# Patient Record
Sex: Female | Born: 1945 | Race: White | Hispanic: No | Smoking: Never smoker
Health system: Southern US, Community
[De-identification: ages and names within clinical notes are randomized; demographics above are authoritative.]

## PROBLEM LIST (undated history)

## (undated) DIAGNOSIS — E785 Hyperlipidemia, unspecified: Secondary | ICD-10-CM

## (undated) DIAGNOSIS — R9230 Dense breasts, unspecified: Secondary | ICD-10-CM

## (undated) DIAGNOSIS — M1990 Unspecified osteoarthritis, unspecified site: Secondary | ICD-10-CM

## (undated) DIAGNOSIS — M549 Dorsalgia, unspecified: Secondary | ICD-10-CM

## (undated) DIAGNOSIS — R32 Unspecified urinary incontinence: Secondary | ICD-10-CM

## (undated) DIAGNOSIS — T7840XA Allergy, unspecified, initial encounter: Secondary | ICD-10-CM

## (undated) DIAGNOSIS — M810 Age-related osteoporosis without current pathological fracture: Secondary | ICD-10-CM

## (undated) DIAGNOSIS — H9190 Unspecified hearing loss, unspecified ear: Secondary | ICD-10-CM

## (undated) DIAGNOSIS — R270 Ataxia, unspecified: Secondary | ICD-10-CM

## (undated) DIAGNOSIS — E079 Disorder of thyroid, unspecified: Secondary | ICD-10-CM

## (undated) DIAGNOSIS — F3289 Other specified depressive episodes: Secondary | ICD-10-CM

## (undated) DIAGNOSIS — F329 Major depressive disorder, single episode, unspecified: Secondary | ICD-10-CM

## (undated) DIAGNOSIS — R922 Inconclusive mammogram: Secondary | ICD-10-CM

## (undated) DIAGNOSIS — N950 Postmenopausal bleeding: Secondary | ICD-10-CM

## (undated) DIAGNOSIS — D219 Benign neoplasm of connective and other soft tissue, unspecified: Secondary | ICD-10-CM

## (undated) DIAGNOSIS — K219 Gastro-esophageal reflux disease without esophagitis: Secondary | ICD-10-CM

## (undated) DIAGNOSIS — H269 Unspecified cataract: Secondary | ICD-10-CM

## (undated) DIAGNOSIS — K08409 Partial loss of teeth, unspecified cause, unspecified class: Secondary | ICD-10-CM

## (undated) DIAGNOSIS — J309 Allergic rhinitis, unspecified: Secondary | ICD-10-CM

## (undated) HISTORY — DX: Inconclusive mammogram: R92.2

## (undated) HISTORY — DX: Allergy, unspecified, initial encounter: T78.40XA

## (undated) HISTORY — DX: Unspecified urinary incontinence: R32

## (undated) HISTORY — DX: Unspecified hearing loss, unspecified ear: H91.90

## (undated) HISTORY — PX: CATARACT EXTRACTION: SUR2

## (undated) HISTORY — DX: Partial loss of teeth, unspecified cause, unspecified class: K08.409

## (undated) HISTORY — DX: Unspecified cataract: H26.9

## (undated) HISTORY — PX: OTHER SURGICAL HISTORY: SHX169

## (undated) HISTORY — DX: Hyperlipidemia, unspecified: E78.5

## (undated) HISTORY — PX: MENISCUS REPAIR: SHX5179

## (undated) HISTORY — PX: WISDOM TOOTH EXTRACTION: SHX21

## (undated) HISTORY — DX: Ataxia, unspecified: R27.0

## (undated) HISTORY — DX: Postmenopausal bleeding: N95.0

## (undated) HISTORY — DX: Unspecified osteoarthritis, unspecified site: M19.90

## (undated) HISTORY — DX: Benign neoplasm of connective and other soft tissue, unspecified: D21.9

## (undated) HISTORY — DX: Disorder of thyroid, unspecified: E07.9

## (undated) HISTORY — PX: EYE SURGERY: SHX253

## (undated) HISTORY — DX: Major depressive disorder, single episode, unspecified: F32.9

## (undated) HISTORY — DX: Gastro-esophageal reflux disease without esophagitis: K21.9

## (undated) HISTORY — DX: Other specified depressive episodes: F32.89

## (undated) HISTORY — DX: Allergic rhinitis, unspecified: J30.9

## (undated) HISTORY — DX: Dorsalgia, unspecified: M54.9

## (undated) HISTORY — DX: Age-related osteoporosis without current pathological fracture: M81.0

## (undated) HISTORY — DX: Dense breasts, unspecified: R92.30

---

## 1999-12-25 ENCOUNTER — Other Ambulatory Visit: Admission: RE | Admit: 1999-12-25 | Discharge: 1999-12-25 | Payer: Self-pay | Admitting: Obstetrics and Gynecology

## 2001-01-02 ENCOUNTER — Other Ambulatory Visit: Admission: RE | Admit: 2001-01-02 | Discharge: 2001-01-02 | Payer: Self-pay | Admitting: Obstetrics and Gynecology

## 2001-07-14 ENCOUNTER — Other Ambulatory Visit: Admission: RE | Admit: 2001-07-14 | Discharge: 2001-07-14 | Payer: Self-pay | Admitting: Obstetrics and Gynecology

## 2001-09-03 HISTORY — PX: DILATION AND CURETTAGE OF UTERUS: SHX78

## 2001-11-20 ENCOUNTER — Other Ambulatory Visit: Admission: RE | Admit: 2001-11-20 | Discharge: 2001-11-20 | Payer: Self-pay | Admitting: Obstetrics and Gynecology

## 2001-12-02 DIAGNOSIS — N950 Postmenopausal bleeding: Secondary | ICD-10-CM

## 2001-12-02 HISTORY — DX: Postmenopausal bleeding: N95.0

## 2001-12-05 ENCOUNTER — Encounter: Payer: Self-pay | Admitting: Obstetrics and Gynecology

## 2001-12-05 ENCOUNTER — Encounter: Admission: RE | Admit: 2001-12-05 | Discharge: 2001-12-05 | Payer: Self-pay | Admitting: Obstetrics and Gynecology

## 2001-12-05 ENCOUNTER — Other Ambulatory Visit: Admission: RE | Admit: 2001-12-05 | Discharge: 2001-12-05 | Payer: Self-pay | Admitting: Radiology

## 2001-12-05 ENCOUNTER — Encounter (INDEPENDENT_AMBULATORY_CARE_PROVIDER_SITE_OTHER): Payer: Self-pay | Admitting: *Deleted

## 2001-12-05 HISTORY — PX: BREAST CYST ASPIRATION: SHX578

## 2001-12-08 ENCOUNTER — Other Ambulatory Visit: Admission: RE | Admit: 2001-12-08 | Discharge: 2001-12-08 | Payer: Self-pay | Admitting: Obstetrics and Gynecology

## 2002-01-01 HISTORY — PX: HYSTEROSCOPY: SHX211

## 2002-01-23 ENCOUNTER — Ambulatory Visit (HOSPITAL_COMMUNITY): Admission: RE | Admit: 2002-01-23 | Discharge: 2002-01-23 | Payer: Self-pay | Admitting: Obstetrics and Gynecology

## 2002-01-23 ENCOUNTER — Encounter (INDEPENDENT_AMBULATORY_CARE_PROVIDER_SITE_OTHER): Payer: Self-pay | Admitting: *Deleted

## 2002-06-05 ENCOUNTER — Other Ambulatory Visit: Admission: RE | Admit: 2002-06-05 | Discharge: 2002-06-05 | Payer: Self-pay | Admitting: Obstetrics and Gynecology

## 2002-12-14 ENCOUNTER — Encounter: Payer: Self-pay | Admitting: Obstetrics and Gynecology

## 2002-12-14 ENCOUNTER — Encounter: Admission: RE | Admit: 2002-12-14 | Discharge: 2002-12-14 | Payer: Self-pay | Admitting: Obstetrics and Gynecology

## 2002-12-24 ENCOUNTER — Other Ambulatory Visit: Admission: RE | Admit: 2002-12-24 | Discharge: 2002-12-24 | Payer: Self-pay | Admitting: Obstetrics and Gynecology

## 2003-06-28 ENCOUNTER — Other Ambulatory Visit: Admission: RE | Admit: 2003-06-28 | Discharge: 2003-06-28 | Payer: Self-pay | Admitting: Obstetrics and Gynecology

## 2003-12-13 ENCOUNTER — Other Ambulatory Visit: Admission: RE | Admit: 2003-12-13 | Discharge: 2003-12-13 | Payer: Self-pay | Admitting: Obstetrics and Gynecology

## 2003-12-15 ENCOUNTER — Encounter: Admission: RE | Admit: 2003-12-15 | Discharge: 2003-12-15 | Payer: Self-pay | Admitting: Obstetrics and Gynecology

## 2004-03-31 ENCOUNTER — Ambulatory Visit (HOSPITAL_COMMUNITY): Admission: RE | Admit: 2004-03-31 | Discharge: 2004-03-31 | Payer: Self-pay | Admitting: Gastroenterology

## 2004-08-18 ENCOUNTER — Other Ambulatory Visit: Admission: RE | Admit: 2004-08-18 | Discharge: 2004-08-18 | Payer: Self-pay | Admitting: Obstetrics and Gynecology

## 2004-12-15 ENCOUNTER — Encounter: Admission: RE | Admit: 2004-12-15 | Discharge: 2004-12-15 | Payer: Self-pay | Admitting: Obstetrics and Gynecology

## 2004-12-26 ENCOUNTER — Other Ambulatory Visit: Admission: RE | Admit: 2004-12-26 | Discharge: 2004-12-26 | Payer: Self-pay | Admitting: Obstetrics and Gynecology

## 2005-05-08 ENCOUNTER — Ambulatory Visit: Payer: Self-pay | Admitting: Internal Medicine

## 2005-12-14 ENCOUNTER — Emergency Department (HOSPITAL_COMMUNITY): Admission: EM | Admit: 2005-12-14 | Discharge: 2005-12-14 | Payer: Self-pay | Admitting: Family Medicine

## 2005-12-18 ENCOUNTER — Encounter: Admission: RE | Admit: 2005-12-18 | Discharge: 2005-12-18 | Payer: Self-pay | Admitting: Obstetrics and Gynecology

## 2006-01-18 ENCOUNTER — Other Ambulatory Visit: Admission: RE | Admit: 2006-01-18 | Discharge: 2006-01-18 | Payer: Self-pay | Admitting: Obstetrics and Gynecology

## 2006-02-14 ENCOUNTER — Encounter: Admission: RE | Admit: 2006-02-14 | Discharge: 2006-02-14 | Payer: Self-pay | Admitting: Obstetrics and Gynecology

## 2006-09-04 ENCOUNTER — Emergency Department (HOSPITAL_COMMUNITY): Admission: EM | Admit: 2006-09-04 | Discharge: 2006-09-04 | Payer: Self-pay | Admitting: Family Medicine

## 2006-09-27 ENCOUNTER — Ambulatory Visit: Payer: Self-pay | Admitting: Internal Medicine

## 2007-01-03 ENCOUNTER — Other Ambulatory Visit: Admission: RE | Admit: 2007-01-03 | Discharge: 2007-01-03 | Payer: Self-pay | Admitting: Obstetrics and Gynecology

## 2007-01-07 ENCOUNTER — Encounter: Admission: RE | Admit: 2007-01-07 | Discharge: 2007-01-07 | Payer: Self-pay | Admitting: Obstetrics and Gynecology

## 2007-01-09 ENCOUNTER — Encounter: Admission: RE | Admit: 2007-01-09 | Discharge: 2007-01-09 | Payer: Self-pay | Admitting: Obstetrics and Gynecology

## 2007-09-22 DIAGNOSIS — J3089 Other allergic rhinitis: Secondary | ICD-10-CM | POA: Insufficient documentation

## 2007-09-22 DIAGNOSIS — J302 Other seasonal allergic rhinitis: Secondary | ICD-10-CM | POA: Insufficient documentation

## 2007-09-23 ENCOUNTER — Ambulatory Visit: Payer: Self-pay | Admitting: Internal Medicine

## 2007-09-23 DIAGNOSIS — K219 Gastro-esophageal reflux disease without esophagitis: Secondary | ICD-10-CM | POA: Insufficient documentation

## 2008-01-08 ENCOUNTER — Encounter: Admission: RE | Admit: 2008-01-08 | Discharge: 2008-01-08 | Payer: Self-pay | Admitting: Obstetrics and Gynecology

## 2008-01-09 ENCOUNTER — Other Ambulatory Visit: Admission: RE | Admit: 2008-01-09 | Discharge: 2008-01-09 | Payer: Self-pay | Admitting: Obstetrics and Gynecology

## 2008-09-22 ENCOUNTER — Ambulatory Visit: Payer: Self-pay | Admitting: Internal Medicine

## 2009-01-10 ENCOUNTER — Encounter: Admission: RE | Admit: 2009-01-10 | Discharge: 2009-01-10 | Payer: Self-pay | Admitting: Obstetrics and Gynecology

## 2009-02-01 ENCOUNTER — Encounter: Payer: Self-pay | Admitting: Internal Medicine

## 2009-02-01 LAB — CONVERTED CEMR LAB

## 2009-09-21 ENCOUNTER — Ambulatory Visit: Payer: Self-pay | Admitting: Internal Medicine

## 2010-01-11 ENCOUNTER — Encounter: Admission: RE | Admit: 2010-01-11 | Discharge: 2010-01-11 | Payer: Self-pay | Admitting: Obstetrics and Gynecology

## 2010-01-16 ENCOUNTER — Encounter: Admission: RE | Admit: 2010-01-16 | Discharge: 2010-01-16 | Payer: Self-pay | Admitting: Obstetrics and Gynecology

## 2010-02-01 LAB — HM MAMMOGRAPHY

## 2010-02-13 ENCOUNTER — Encounter: Payer: Self-pay | Admitting: Internal Medicine

## 2010-02-14 ENCOUNTER — Encounter: Payer: Self-pay | Admitting: Internal Medicine

## 2010-02-14 LAB — CONVERTED CEMR LAB

## 2010-02-15 ENCOUNTER — Encounter: Payer: Self-pay | Admitting: Internal Medicine

## 2010-02-15 LAB — CONVERTED CEMR LAB
Cholesterol: 265 mg/dL
HDL: 68 mg/dL
LDL Cholesterol: 170 mg/dL
Triglyceride fasting, serum: 137 mg/dL

## 2010-09-21 ENCOUNTER — Ambulatory Visit
Admission: RE | Admit: 2010-09-21 | Discharge: 2010-09-21 | Payer: Self-pay | Source: Home / Self Care | Attending: Internal Medicine | Admitting: Internal Medicine

## 2010-09-24 ENCOUNTER — Encounter: Payer: Self-pay | Admitting: Obstetrics and Gynecology

## 2010-10-05 NOTE — Assessment & Plan Note (Signed)
Summary: 12 MTH FU-STC   Visit Type:  Follow-up PCP:  none  Chief Complaint:  Yearly follow up.  History of Present Illness: allergic rhintis  F/U. Stopped Nasonex - was causing epistaxis and burned throat to cause cough. Still dry pnd type cough and sniff. Denies sinus pressur/pain or purulence. Rarely a little chest  tight without cough now  or wheeze .Much heart burn. Takes daily otc prilosec.    Current Allergies: No known allergies   Past Surgical History:    Reviewed history and no changes required:       none   Family History:    Reviewed history and no changes required:       Mother died Alzheimers       Father died lung cancer       Allergic rhinitis- mother and brothers  Social History:    Reviewed history and no changes required:       Patient never smoked.        Manager of computer programming   Risk Factors:  Tobacco use:  never   Review of Systems      See HPI   Vital Signs:  Patient Profile:   65 Years Old Female Weight:      180.38 pounds O2 Sat:      97 % O2 treatment:    Room Air Pulse rate:   90 / minute BP sitting:   142 / 82  (left arm) Cuff size:   regular  Vitals Entered By: Reynaldo Minium CMA (September 23, 2007 9:54 AM)             Comments Medications reviewed with patient  ..................................................................Marland KitchenReynaldo Minium CMA  September 23, 2007 9:54 AM     Physical Exam  General:     normal appearance.   Head:     normocephalic and atraumatic Eyes:     PERRLA/EOM intact; conjunctiva and sclera clear Nose:     A little mucus bridging without erythema Mouth:     mild tonsil erythema Neck:     no masses, thyromegaly, or abnormal cervical nodes Chest Wall:     no deformities noted Lungs:     clear bilaterally to auscultation and percussion Heart:     regular rate and rhythm, S1, S2 without murmurs, rubs, gallops, or clicks Extremities:     no clubbing, cyanosis, edema, or  deformity noted Cervical Nodes:     no significant adenopathy     Impression & Recommendations:  Problem # 1:  ALLERGIC RHINITIS (ICD-477.9) Assessment: Unchanged D/c nasonex which is associated with epistaxis. Try sample Patanase Try Claritin or Zyrtec otc prn  The following medications were removed from the medication list:    Nasonex 50 Mcg/act Susp (Mometasone furoate) ..... Use 2 sprays daily  Her updated medication list for this problem includes:    Patanase 0.6 % Soln (Olopatadine hcl) .Marland Kitchen... 1-2 sprays each nostril up to two times a day as needed  Orders: Est. Patient Level III (16109)   Problem # 2:  GERD (ICD-530.81) discussed reflux precautions and options Her updated medication list for this problem includes:    Prilosec Otc 20 Mg Tbec (Omeprazole magnesium) .Marland Kitchen... 1 daily  Orders: Est. Patient Level III (60454)   Medications Added to Medication List This Visit: 1)  Prilosec Otc 20 Mg Tbec (Omeprazole magnesium) .Marland Kitchen.. 1 daily 2)  Patanase 0.6 % Soln (Olopatadine hcl) .Marland Kitchen.. 1-2 sprays each nostril up to two times a day as needed  Patient Instructions: 1)  Please schedule a follow-up appointment in 1 year. 2)  Try Prilosec twice daily before meals and carry Tums. 3)  Try sample Patanase nasal spray, 1-2 puffs each nostril up to twice daily if needed.- 4)  Try over the counter antihistamine like Claritin or Zyrtec, with or wihout a decongestant "-D")    Prescriptions: PATANASE 0.6 %  SOLN (OLOPATADINE HCL) 1-2 sprays each nostril up to two times a day as needed  #1 x prn   Entered and Authorized by:   Waymon Budge MD   Signed by:   Waymon Budge MD on 09/28/2007   Method used:   Historical   RxID:   [id number]  ]

## 2010-10-05 NOTE — Assessment & Plan Note (Signed)
Summary: rov 1 yr ///kp   Primary Provider/Referring Provider:  none  CC:  Follow up visit-allergies; occasional sneezing.Marland Kitchen  History of Present Illness: Oct 14, 2008- Allergic rhinitis, hx GERD Good year and winter so far with no major events. Had seasonal flu vax. Hx skin test positive allergic rhinits 1990's rx'd with vaccine for aboutr 6 months then. Does c/o sniffing, cough, nasal drainage. Claritin -D is big help but then she can't sleep.Discussed antihistamine, decongestant and steroid nasal comparison.   September 21, 2009- Allergic rhinitis, hx GERD Much of last year has had nasal congestion and postnasal drip. Astepro overdries/ nosebleeds. Nasal stuffiness keeps her awake. Ears and chest are ok. Wakes with morning frontal headache. Nasal discharge is yellow, scant.  September 21, 2010-  Allergic rhinitis, hx GERD Nurse-CC: Follow up visit-allergies; occasional sneezing. Discussed a nasal blockage sensation when her dentist applied a nasal cone for anesthetic -sounds as if it was a mechanical issue, not induced nasal congestion. Persistent perennial sense of stuffiness . No beneft from Astepro and over the past year doesn't renmember needing antihistamines; hasn't even had a cold.      Preventive Screening-Counseling & Management  Alcohol-Tobacco     Smoking Status: never     Passive Smoke Exposure: yes  Comments: First husband was smoker- died of lung Ca.   Current Medications (verified): 1)  Prempro 0.3-1.5 Mg Tabs (Conj Estrog-Medroxyprogest Ace) .... Take 1 By Mouth Once Daily 2)  Prilosec Otc 20 Mg  Tbec (Omeprazole Magnesium) .Marland Kitchen.. 1 Daily 3)  Vitamin D (Ergocalciferol) 50000 Unit Caps (Ergocalciferol) .... Take 1 By Mouth Weekly 4)  Vitamin E 1000 Unit Caps (Vitamin E) .... Take  1 By Mouth Once Daily 5)  Calcium-Vitamin D 500-200 Mg-Unit Tabs (Calcium Carbonate-Vitamin D) .... Take 1 By Mouth Once Daily Slow Release Tablets 6)  Fish Oil 1200 Mg Caps (Omega-3 Fatty  Acids) .... Take 1 By Mouth Once Daily 7)  Centrum Silver  Tabs (Multiple Vitamins-Minerals) .... Take 1 By Mouth Once Daily  Allergies (verified): No Known Drug Allergies  Past History:  Past Medical History: Last updated: 10/14/2008  GERD (ICD-530.81) ALLERGIC RHINITIS (ICD-477.9)  Past Surgical History: Last updated: 09/21/2009 arthroscopy left knee  Family History: Last updated: 10/14/2008 Mother died-age 74;Alzheimers,breast cancer,allergies Father died-age 37;lung cancer Allergic rhinitis- mother and brothers   Sibling 1-deceased age 47; alzheimers Sibling 2- living age 53; COPD  Social History: Last updated: 10-14-08 Patient never smoked.  Manager of computer programming Positive history of passive tobacco smoke exposure.  Caffeine-2 cups daily ETOH-1 drink every 2 months Married with no children.  Risk Factors: Smoking Status: never (09/21/2010) Passive Smoke Exposure: yes (09/21/2010)  Review of Systems      See HPI  The patient denies anorexia, fever, weight loss, weight gain, vision loss, decreased hearing, hoarseness, chest pain, syncope, dyspnea on exertion, peripheral edema, prolonged cough, headaches, hemoptysis, abdominal pain, melena, hematochezia, severe indigestion/heartburn, suspicious skin lesions, unusual weight change, abnormal bleeding, and enlarged lymph nodes.    Vital Signs:  Patient profile:   65 year old female Height:      66 inches Weight:      164.38 pounds BMI:     26.63 O2 Sat:      95 % on Room air Pulse rate:   98 / minute BP sitting:   140 / 90  (left arm) Cuff size:   regular  Vitals Entered By: Reynaldo Minium CMA (September 21, 2010 1:40 PM)  O2 Flow:  Room air CC:  Follow up visit-allergies; occasional sneezing.   Physical Exam  Additional Exam:  General: A/Ox3; pleasant and cooperative, NAD, well appearing SKIN: no rash, lesions NODES: no lymphadenopathy HEENT: [place]/AT, EOM- WNL, Conjuctivae- clear, PERRLA,  TM-WNL, Nose- clear, no drainage or polyps seen, Throat- clear and wnl, Mallampati  NECK: Supple w/ fair ROM, JVD- none, normal carotid impulses w/o bruits Thyroid- normal to palpation CHEST: Clear to P&A HEART: RRR, no m/g/r heard ABDOMEN: Soft and nl;  XBJ:YNWG, nl pulses, no edema  NEURO: Grossly intact to observation      Impression & Recommendations:  Problem # 1:  ALLERGIC RHINITIS (ICD-477.9)  Mild perennial sense of stuffiness without objective findings and not bothering her enough that she sees benefit to treating it . I will see her as needed and recommended she get a primary doctor.  The following medications were removed from the medication list:    Astepro 0.15 % Soln (Azelastine hcl) .Marland Kitchen... 1-2 puffs each nostril up to twice daily as needed    Nasonex 50 Mcg/act Susp (Mometasone furoate) .Marland Kitchen... 1-2 sprays each nostril once daily  Medications Added to Medication List This Visit: 1)  Vitamin E 1000 Unit Caps (Vitamin e) .... Take  1 by mouth once daily 2)  Calcium-vitamin D 500-200 Mg-unit Tabs (Calcium carbonate-vitamin d) .... Take 1 by mouth once daily slow release tablets 3)  Fish Oil 1200 Mg Caps (Omega-3 fatty acids) .... Take 1 by mouth once daily 4)  Centrum Silver Tabs (Multiple vitamins-minerals) .... Take 1 by mouth once daily  Other Orders: Est. Patient Level III (95621) Primary Care Referral (Primary)  Patient Instructions: 1)  Please schedule a follow-up appointment as needed. I'll be happy to see you as needed. 2)  Possible names for primary physicians 3)  See our [facility]

## 2010-10-05 NOTE — Assessment & Plan Note (Signed)
Summary: 1 year/apc   PCP:  none  Chief Complaint:  yearly follow up visit .  History of Present Illness: Current Problems:  GERD (ICD-530.81) ALLERGIC RHINITIS (ICD-477.9)  09/23/07- History of Present Illness: allergic rhintis F/U. Stopped Nasonex - was causing epistaxis and burned throat to cause cough. Still dry pnd type cough and sniff. Denies sinus pressur/pain or purulence. Rarely a little chest  tight without cough now  or wheeze .Much heart burn. Takes daily otc prilosec.   09/22/08- Allergic rhinitis, hx GERD Good year and winter so far with no major events. Had seasonal flu vax. Hx skin test positive allergic rhinits 1990's rx'd with vaccine for aboutr 6 months then. Does c/o sniffing, cough, nasal drainage. Claritin -D is big help but then she can't sleep.Discussed antihistamine, decongestant and steroid nasal comparison.           Prior Medications Reviewed Using: Patient Recall  Updated Prior Medication List: PROZAC 40 MG  CAPS (FLUOXETINE HCL) take one capsule by mouth once daily WELLBUTRIN XL 300 MG  TB24 (BUPROPION HCL) Take 1 tablet by mouth once a day PREMPRO 0.45-1.5 MG  TABS (CONJ ESTROG-MEDROXYPROGEST ACE) Take 1 tablet by mouth once a day PRILOSEC OTC 20 MG  TBEC (OMEPRAZOLE MAGNESIUM) 1 daily  Current Allergies (reviewed today): No known allergies   Past Medical History:    Reviewed history and no changes required:              GERD (ICD-530.81)       ALLERGIC RHINITIS (ICD-477.9)         Past Surgical History:    Reviewed history from 09/23/2007 and no changes required:       none   Family History:    Reviewed history from 09/23/2007 and no changes required:       Mother died-age 59;Alzheimers,breast cancer,allergies       Father died-age 27;lung cancer       Allergic rhinitis- mother and brothers                     Sibling 1-deceased age 63; alzheimers       Sibling 2- living age 23; COPD  Social History:    Reviewed history  from 09/23/2007 and no changes required:       Patient never smoked.        Manager of computer programming       Positive history of passive tobacco smoke exposure.        Caffeine-2 cups daily       ETOH-1 drink every 2 months       Married with no children.   Risk Factors:  Passive smoke exposure:  yes   Review of Systems      See HPI  The patient denies anorexia, fever, weight loss, weight gain, vision loss, decreased hearing, hoarseness, chest pain, syncope, dyspnea on exertion, peripheral edema, prolonged cough, headaches, hemoptysis, abdominal pain, melena, hematochezia, severe indigestion/heartburn, hematuria, incontinence, muscle weakness, suspicious skin lesions, transient blindness, difficulty walking, depression, unusual weight change, abnormal bleeding, enlarged lymph nodes, and angioedema.     Vital Signs:  Patient Profile:   65 Years Old Female Weight:      177.25 pounds O2 Sat:      96 % O2 treatment:    Room Air Pulse rate:   85 / minute BP sitting:   148 / 80  (right arm) Cuff size:   regular  Vitals Entered By: Reynaldo Minium CMA (September 22, 2008 9:18 AM)             Comments Medications reviewed with patient Reynaldo Minium CMA  September 22, 2008 9:18 AM      Physical Exam  General: A/Ox3; pleasant and cooperative, NAD, SKIN: no rash, lesions NODES: no lymphadenopathy HEENT: [place]/AT, EOM- WNL, Conjuctivae- clear, PERRLA, TM-WNL, Nose- clear, Throat- clear and wnl, Melampatti II NECK: Supple w/ fair ROM, JVD- none, normal carotid impulses w/o bruits Thyroid- normal to palpation CHEST: Clear to P&A HEART: RRR, no m/g/r heard ABDOMEN: Soft and nl; nml bowel sounds; no organomegaly or masses noted EAV:WUJW, nl pulses, no edema  NEURO: Grossly intact to observation         Impression & Recommendations:  Problem # 1:  ALLERGIC RHINITIS (ICD-477.9) Needs a little more intervention. discussed use of Sudafed-PE otc, with plain Clairitin The  following medications were removed from the medication list:    Patanase 0.6 % Soln (Olopatadine hcl) .Marland Kitchen... 1-2 sprays each nostril up to two times a day as needed  Her updated medication list for this problem includes:    Astepro 0.15 % Soln (Azelastine hcl) .Marland Kitchen... 1-2 puffs each nostril up to twice daily as needed   Medications Added to Medication List This Visit: 1)  Astepro 0.15 % Soln (Azelastine hcl) .Marland Kitchen.. 1-2 puffs each nostril up to twice daily as needed  Other Orders: Pneumococcal Vaccine (11914) Admin 1st Vaccine (78295)   Patient Instructions: 1)  Please schedule a follow-up appointment in 1 year. 2)  Try sample/ script Astepro- antihistamine nasal spray 1-2 puffs each nostril up to twice daily as needed 3)  Consider otc Sudafed-PE as a decongestant for use by itself or with plain claritin as needed. Try to stay away from "24 hour" products until you are sure they won't keep you awake. 4)  Pnuemonia vax # 1   Prescriptions: ASTEPRO 0.15 % SOLN (AZELASTINE HCL) 1-2 puffs each nostril up to twice daily as needed  #1 x prn   Entered and Authorized by:   Waymon Budge MD   Signed by:   Waymon Budge MD on 09/22/2008   Method used:   Print then Give to Patient   RxID:   [id number]    Pneumovax Vaccine    Vaccine Type: Pneumovax    Site: left deltoid    Mfr: Merck    Dose: 0.5 ml    Route: IM    Given by: Boone Master CNA    Exp. Date: 08/05/2009    Lot #: [id number]    VIS given: 03/31/96 version given September 22, 2008.

## 2010-10-05 NOTE — Assessment & Plan Note (Signed)
Summary: f/u 1 yr///kp   Primary Provider/Referring Provider:  none  CC:  Yearly follow up visit-Increased Allergy trouble-trouble sleeping due to congestion (nasal)..  History of Present Illness:  2007-10-11- History of Present Illness: allergic rhintis F/U. Stopped Nasonex - was causing epistaxis and burned throat to cause cough. Still dry pnd type cough and sniff. Denies sinus pressur/pain or purulence. Rarely a little chest  tight without cough now  or wheeze .Much heart burn. Takes daily otc prilosec.   2008/10/10- Allergic rhinitis, hx GERD Good year and winter so far with no major events. Had seasonal flu vax. Hx skin test positive allergic rhinits 1990's rx'd with vaccine for aboutr 6 months then. Does c/o sniffing, cough, nasal drainage. Claritin -D is big help but then she can't sleep.Discussed antihistamine, decongestant and steroid nasal comparison.   September 21, 2009- Allergic rhinitis, hx GERD Much of last year has had nasal congestion and postnasal drip. Astepro overdries/ nosebleeds. Nasal stuffiness keeps her awake. Ears and chest are ok. Wakes with morning frontal headache. Nasal discharge is yellow, scant.      Current Medications (verified): 1)  Prozac 40 Mg  Caps (Fluoxetine Hcl) .... Take One Capsule By Mouth Once Daily 2)  Wellbutrin Xl 300 Mg  Tb24 (Bupropion Hcl) .... Take 1 Tablet By Mouth Once A Day 3)  Prempro 0.3-1.5 Mg Tabs (Conj Estrog-Medroxyprogest Ace) .... Take 1 By Mouth Once Daily 4)  Prilosec Otc 20 Mg  Tbec (Omeprazole Magnesium) .Marland Kitchen.. 1 Daily 5)  Astepro 0.15 % Soln (Azelastine Hcl) .Marland Kitchen.. 1-2 Puffs Each Nostril Up To Twice Daily As Needed 6)  Vitamin D (Ergocalciferol) 50000 Unit Caps (Ergocalciferol) .... Take 1 By Mouth Weekly  Allergies (verified): No Known Drug Allergies  Past History:  Past Medical History: Last updated: Oct 10, 2008  GERD (ICD-530.81) ALLERGIC RHINITIS (ICD-477.9)  Family History: Last updated: 2008-10-10 Mother  died-age 61;Alzheimers,breast cancer,allergies Father died-age 16;lung cancer Allergic rhinitis- mother and brothers   Sibling 1-deceased age 75; alzheimers Sibling 2- living age 52; COPD  Social History: Last updated: 10-10-08 Patient never smoked.  Manager of computer programming Positive history of passive tobacco smoke exposure.  Caffeine-2 cups daily ETOH-1 drink every 2 months Married with no children.  Risk Factors: Smoking Status: never (Oct 11, 2007) Passive Smoke Exposure: yes (October 10, 2008)  Past Surgical History: arthroscopy left knee  Review of Systems      See HPI  The patient denies anorexia, fever, weight loss, weight gain, vision loss, decreased hearing, hoarseness, chest pain, syncope, dyspnea on exertion, peripheral edema, prolonged cough, hemoptysis, and severe indigestion/heartburn.    Vital Signs:  Patient profile:   65 year old female Height:      66 inches Weight:      174.25 pounds BMI:     28.23 O2 Sat:      96 % on Room air Pulse rate:   100 / minute BP sitting:   162 / 80  (right arm) Cuff size:   regular  Vitals Entered By: Reynaldo Minium CMA (September 21, 2009 9:06 AM)  O2 Flow:  Room air  Physical Exam  Additional Exam:  General: A/Ox3; pleasant and cooperative, NAD, SKIN: no rash, lesions NODES: no lymphadenopathy HEENT: [place]/AT, EOM- WNL, Conjuctivae- clear, PERRLA, TM-WNL, Nose- clear, no drainage or polyps seen, Throat- clear and wnl, Melampatti II NECK: Supple w/ fair ROM, JVD- none, normal carotid impulses w/o bruits Thyroid- normal to palpation CHEST: Clear to P&A HEART: RRR, no m/g/r heard ABDOMEN: Soft and nl;  EPP:IRJJ, nl  pulses, no edema  NEURO: Grossly intact to observation      Impression & Recommendations:  Problem # 1:  ALLERGIC RHINITIS (ICD-477.9)  Chronic rhinitis. Astepro overdried. I will try a combination of nasal steroid spray and Neti lavage. If no better, then either antibiotics or a sinus CT. Her  updated medication list for this problem includes:    Astepro 0.15 % Soln (Azelastine hcl) .Marland Kitchen... 1-2 puffs each nostril up to twice daily as needed    Nasonex 50 Mcg/act Susp (Mometasone furoate) .Marland Kitchen... 1-2 sprays each nostril once daily  Medications Added to Medication List This Visit: 1)  Prempro 0.3-1.5 Mg Tabs (Conj estrog-medroxyprogest ace) .... Take 1 by mouth once daily 2)  Vitamin D (ergocalciferol) 50000 Unit Caps (Ergocalciferol) .... Take 1 by mouth weekly 3)  Nasonex 50 Mcg/act Susp (Mometasone furoate) .Marland Kitchen.. 1-2 sprays each nostril once daily  Other Orders: Est. Patient Level III (81191)  Patient Instructions: 1)  Schedule return in one year, earlier if needed 2)  If you don't get disitinctly better, then call so we can try some thing else.  3)  Try sample/ script Nasonex nasal steroid spray: 2 puffs each nostril once daily 4)  Try rinsing nasal passages with saline- Neti pot or squeeze bottle, every day or so. Prescriptions: NASONEX 50 MCG/ACT SUSP (MOMETASONE FUROATE) 1-2 sprays each nostril once daily  #1 x prn   Entered and Authorized by:   Waymon Budge MD   Signed by:   Waymon Budge MD on 09/21/2009   Method used:   Print then Give to Patient   RxID:   [id number]

## 2010-11-01 ENCOUNTER — Encounter: Payer: Self-pay | Admitting: Internal Medicine

## 2010-11-01 ENCOUNTER — Ambulatory Visit (INDEPENDENT_AMBULATORY_CARE_PROVIDER_SITE_OTHER): Payer: 59 | Admitting: Internal Medicine

## 2010-11-01 DIAGNOSIS — K219 Gastro-esophageal reflux disease without esophagitis: Secondary | ICD-10-CM

## 2010-11-01 DIAGNOSIS — Z87448 Personal history of other diseases of urinary system: Secondary | ICD-10-CM | POA: Insufficient documentation

## 2010-11-01 DIAGNOSIS — J309 Allergic rhinitis, unspecified: Secondary | ICD-10-CM

## 2010-11-01 DIAGNOSIS — F329 Major depressive disorder, single episode, unspecified: Secondary | ICD-10-CM | POA: Insufficient documentation

## 2010-11-01 DIAGNOSIS — R32 Unspecified urinary incontinence: Secondary | ICD-10-CM

## 2010-11-01 DIAGNOSIS — F419 Anxiety disorder, unspecified: Secondary | ICD-10-CM | POA: Insufficient documentation

## 2010-11-01 DIAGNOSIS — N3281 Overactive bladder: Secondary | ICD-10-CM | POA: Insufficient documentation

## 2010-11-01 DIAGNOSIS — E785 Hyperlipidemia, unspecified: Secondary | ICD-10-CM | POA: Insufficient documentation

## 2010-11-01 DIAGNOSIS — G43909 Migraine, unspecified, not intractable, without status migrainosus: Secondary | ICD-10-CM | POA: Insufficient documentation

## 2010-11-09 ENCOUNTER — Encounter: Payer: Self-pay | Admitting: Internal Medicine

## 2010-11-09 NOTE — Assessment & Plan Note (Signed)
Summary: NEW/UHC/NWS   Vital Signs:  Patient profile:   65 year old female Height:      66 inches (167.64 cm) Weight:      163.6 pounds (74.36 kg) O2 Sat:      97 % on Room air Temp:     97.9 degrees F (36.61 degrees C) oral Pulse rate:   93 / minute BP sitting:   132 / 72  (left arm) Cuff size:   regular  Vitals Entered By: Orlan Leavens RMA (November 01, 2010 3:02 PM)  O2 Flow:  Room air CC: New patient Is Patient Diabetic? No Pain Assessment Patient in pain? no        Primary Care Provider:  Newt Lukes MD  CC:  New patient.  History of Present Illness: new to me and our division, here to est care  c/o urinary incontinence describes progressive urge symptoms>stress related incontinence  onset >5yr ago with stress symptoms, but urge worse in past 12 mo no dysuria, no hematuria not improved with behav changes has not tried meds for same but would like to do so now  also reviewed chronci med issues: allg rhinitis- freq pulm and sinus infx related to flares - sees pulm for same - uses otc meds to tx same as needed  vit d defic - follows with gyn for same - reports compliance with ongoing medical treatment and no changes in medication dose or frequency. denies adverse side effects related to current therapy.   dyslipidemia - has declined med tx suggested by gyn - understands need for diet and exercise to control same but not compliant with recs for same - no ASD complications in FH  Preventive Screening-Counseling & Management  Alcohol-Tobacco     Alcohol drinks/day: <1     Alcohol Counseling: not indicated; use of alcohol is not excessive or problematic     Smoking Status: never     Tobacco Counseling: not indicated; no tobacco use  Caffeine-Diet-Exercise     Does Patient Exercise: no     Exercise Counseling: to improve exercise regimen     Depression Counseling: not indicated; screening negative for depression  Safety-Violence-Falls     Seat Belt  Counseling: not indicated; patient wears seat belts     Firearm Counseling: not applicable     Violence Counseling: not indicated; no violence risk noted  Clinical Review Panels:  Prevention   Last Mammogram:  No specific mammographic evidence of malignancy.   (02/01/2010)   Last Pap Smear:  Interpretation Result:Negative for intraepithelial Lesion or Malignancy.    (02/01/2009)  Immunizations   Last Pneumovax:  Pneumovax (09/22/2008)   Current Medications (verified): 1)  Prempro 0.3-1.5 Mg Tabs (Conj Estrog-Medroxyprogest Ace) .... Take 1 By Mouth Once Daily 2)  Prilosec Otc 20 Mg  Tbec (Omeprazole Magnesium) .... Take As Needed 3)  Vitamin D (Ergocalciferol) 50000 Unit Caps (Ergocalciferol) .... Take 1 By Mouth Weekly 4)  Vitamin E 1000 Unit Caps (Vitamin E) .... Take  1 By Mouth Once Daily 5)  Calcium-Vitamin D 500-200 Mg-Unit Tabs (Calcium Carbonate-Vitamin D) .... Take 1 By Mouth Once Daily Slow Release Tablets 6)  Fish Oil 1200 Mg Caps (Omega-3 Fatty Acids) .... Take 1 By Mouth Once Daily 7)  Centrum Silver  Tabs (Multiple Vitamins-Minerals) .... Take 1 By Mouth Once Daily 8)  Metamucil 0.52 Gm Caps (Psyllium) .... Take 5 Capsule Daily  Allergies (verified): No Known Drug Allergies  Past History:  Past Medical History: GERD ALLERGIC RHINITIS  Depression hx Hyperlipidemia vit d defic Urinary incontinence, mixed  Md roster: gyn - [facility] - dean pulm - [facility] - mann  Past Surgical History: arthroscopy left knee D & C (2003)  Family History: Mother died-age 24;Alzheimers,breast cancer age 87,allergies Father died-age 38;lung cancer Allergic rhinitis- mother and brothers  Sibling 1-deceased age 87; alzheimers Sibling 2- living; COPD  Social History: Patient never smoked.  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  Caffeine-2 cups daily ETOH-1 drink every 2  months Married with no children.Does Patient Exercise:  no  Review of Systems       see HPI above. I have reviewed all other systems and they were negative.   Physical Exam  General:  alert, well-developed, well-nourished, and cooperative to examination.    Head:  Normocephalic and atraumatic without obvious abnormalities. No apparent alopecia or balding. Eyes:  vision grossly intact; pupils equal, round and reactive to light.  conjunctiva and lids normal.   wears glasses Ears:  R ear normal and L ear normal.   Mouth:  teeth and gums in good repair; mucous membranes moist, without lesions or ulcers. oropharynx clear without exudate, no erythema.  Neck:  supple, full ROM, no masses, no thyromegaly; no thyroid nodules or tenderness. no JVD or carotid bruits.   Lungs:  normal respiratory effort, no intercostal retractions or use of accessory muscles; normal breath sounds bilaterally - no crackles and no wheezes.    Heart:  normal rate, regular rhythm, no murmur, and no rub. BLE without edema. Abdomen:  soft, non-tender, normal bowel sounds, no distention; no masses and no appreciable hepatomegaly or splenomegaly.   Msk:  No deformity or scoliosis noted of thoracic or lumbar spine.   Neurologic:  alert & oriented X3 and cranial nerves II-XII symetrically intact.  strength normal in all extremities, sensation intact to light touch, and gait normal. speech fluent without dysarthria or aphasia; follows commands with good comprehension.  Psych:  Oriented X3, memory intact for recent and remote, normally interactive, good eye contact, not anxious appearing, not depressed appearing, and not agitated.      Impression & Recommendations:  Problem # 1:  URINARY INCONTINENCE (ICD-788.30)  mixed but urge progressive in last 12 mo start vesicare - sample, copay card and erx done today  Orders: Prescription Created Electronically [phone])  Problem # 2:  HYPERLIPIDEMIA (ICD-272.4) send for recent labs  from gyn rec diet and exercsie compliance for lifestyle control of same  Problem # 3:  ALLERGIC RHINITIS (ICD-477.9)  per pulm 09/2010: Mild perennial sense of stuffiness without objective findings and not bothering her enough that she sees benefit to treating it . I will see her as needed and recommended she get a primary doctor.  The following medications were removed from the medication list:    Astepro 0.15 % Soln (Azelastine hcl) .Marland Kitchen... 1-2 puffs each nostril up to twice daily as needed    Nasonex 50 Mcg/act Susp (Mometasone furoate) .Marland Kitchen... 1-2 sprays each nostril once daily  Discussed use of allergy medications and environmental measures.   Problem # 4:  GERD (ICD-530.81)  Her updated medication list for this problem includes:    Prilosec Otc 20 Mg Tbec (Omeprazole magnesium) .Marland Kitchen... Take as needed  discussed reflux precautions and options  Complete Medication List: 1)  Prempro 0.3-1.5 Mg Tabs (Conj estrog-medroxyprogest ace) .... Take 1 by mouth once daily 2)  Prilosec Otc 20 Mg Tbec (  Omeprazole magnesium) .... Take as needed 3)  Vitamin D (ergocalciferol) 50000 Unit Caps (Ergocalciferol) .... Take 1 by mouth weekly 4)  Vitamin E 1000 Unit Caps (Vitamin e) .... Take  1 by mouth once daily 5)  Calcium-vitamin D 500-200 Mg-unit Tabs (Calcium carbonate-vitamin d) .... Take 1 by mouth once daily slow release tablets 6)  Fish Oil 1200 Mg Caps (Omega-3 fatty acids) .... Take 1 by mouth once daily 7)  Centrum Silver Tabs (Multiple vitamins-minerals) .... Take 1 by mouth once daily 8)  Metamucil 0.52 Gm Caps (Psyllium) .... Take 5 capsule daily 9)  Vesicare 5 Mg Tabs (Solifenacin succinate) .Marland Kitchen.. 1 by mouth once daily  Patient Instructions: 1)  it was good to see you today. 2)  medical history and medications reviewed -  3)  start vesicare for overactive bladder symptoms - samples and coupon provided today and your prescription has been electronically submitted to your pharmacy. Please  take as directed. Contact our office if you believe you're having problems with the medication(s).  4)  will send for records from dr. Tresa Res to review - mammo, bone density and labs 5)  Please schedule a follow-up appointment in 6-74months to continue to review of bladder, cholesterol, weight, etc; call sooner if problems.  Prescriptions: VESICARE 5 MG TABS (SOLIFENACIN SUCCINATE) 1 by mouth once daily  #30 x 3   Entered and Authorized by:   Newt Lukes MD   Signed by:   Newt Lukes MD on 11/01/2010   Method used:   Electronically to        [facility] # [phone]* (retail)       [address]       [place], [place]  [address]       Ph: [phone]       Fax: [phone]   RxID:   [id number]    Orders Added: 1)  Prescription Created Electronically [G8553] 2)  New Patient Level IV [44010]     Pap Smear  Procedure date:  02/01/2009  Findings:      Interpretation Result:Negative for intraepithelial Lesion or Malignancy.     Mammogram  Procedure date:  02/01/2010  Findings:      No specific mammographic evidence of malignancy.

## 2010-11-21 NOTE — Letter (Signed)
Summary: [facility]  [facility]   Imported By: Lester Kief 11/14/2010 10:22:31  _____________________________________________________________________  External Attachment:    Type:   Image     Comment:   External Document

## 2010-12-25 ENCOUNTER — Other Ambulatory Visit: Payer: Self-pay | Admitting: Obstetrics and Gynecology

## 2010-12-25 DIAGNOSIS — Z1231 Encounter for screening mammogram for malignant neoplasm of breast: Secondary | ICD-10-CM

## 2011-01-15 ENCOUNTER — Ambulatory Visit
Admission: RE | Admit: 2011-01-15 | Discharge: 2011-01-15 | Disposition: A | Discharge status: Home / Self Care | Payer: 59 | Source: Ambulatory Visit | Attending: Obstetrics and Gynecology | Admitting: Obstetrics and Gynecology

## 2011-01-15 DIAGNOSIS — Z1231 Encounter for screening mammogram for malignant neoplasm of breast: Secondary | ICD-10-CM

## 2011-01-19 NOTE — Op Note (Signed)
[facility] [place]  [place], [place] Visit Number: [id number] MRN: [id number]          Service Type: DSU Location: [facility] Attending Physician:  Jenean Lindau Dictated by:   Laqueta Linden, M.D. Proc. Date: 01/23/02 Admit Date:  01/23/2002 Discharge Date: 01/23/2002                             Operative Report  PREOPERATIVE DIAGNOSIS:       Postmenopausal bleeding due to endometrial                               polyps or submucosal fibroid.  POSTOPERATIVE DIAGNOSES:      1. Postmenopausal bleeding.                               2. Submucosal fibroid.  OPERATION:                    Hysteroscopic resection.  SURGEON:                      Laqueta Linden, M.D.  ANESTHESIA:                   General LMA.  ESTIMATED BLOOD LOSS:         Less than 20 cc. Sorbitol net intake 30 cc.  COMPLICATIONS:                None.  INDICATIONS:                  The patient is a 65 year old menopausal female on long-term hormone replacement therapy who had had no bleeding in over four years who presented with some postmenopausal spotting. She underwent pelvic ultrasound revealing multiple intrauterine fibroids, the largest measuring 2.8 cm. She had a thickened endometrial stripe. On sonohystogram, she was noted to have an echogenic defect measuring 8 x 6 and another 9 x 3 mm which was a questionably bilobed polyp or fibroid versus two separate fibroids. The total length was 18 mm. The patient was counseled regarding alternatives including hysteroscopic resection. She saw the informed consent film, voiced her understanding and acceptance of risks, benefits, alternatives, complications, recovery expectations, and the possibility due to her multiple fibroids she will continue to have problems and agrees to proceed to conservative surgery. Full consent has been given.  PROCEDURE:                    The patient was taken to the operating room and after proper  identification and consents were ascertained, she was placed on the operating table in the supine position. After the induction of general LMA anesthesia, she was placed in the [place] stirrups and the perineum and vagina were prepped and draped in a routine sterile fashion. A transurethral Foley was placed which was removed at the conclusion of the procedure. Bimanual examination confirmed a midplane to retroverted enlarged irregular uterus. A speculum was placed in the vagina, the cervix grasped with single-tooth tenaculum. The uterine cavity sounded to 10 cm posteriorly. The endocervical canal was gently dilated to a #33 Pratt dilator. The resectoscope with continuous sorbitol infusion was inserted under direct vision. The endocervical canal was free of lesions. There was some difficulty in  distending the uterine cavity presumably due to the multiple fibroids. The pressure of the sorbitol was placed on 100 mm with improved distention. There was what appeared to be a smooth fibroid protruding from the uterine fundus. The resectoscope was placed on routing settings and the fibroid was then resected in multiple pieces which were sent to pathology. At the base of the fibroid, hemostasis was excellent. There was some bulging into the endometrial cavity into the fundus which was suspicious for some additional fibroid tissue. However, due to the depth of the resection, it was felt that further resection would put the patient at great risk for uterine perforation so no additional resection was performed. The remainder of the endometrial cavity was free of any lesions and appeared atrophic. All specimens were sent to pathology. The resectoscope was then removed. Hemostasis was noted to be excellent. The tenaculum site was hemostatic as well. There was a scant amount of bleeding per os. Net sorbitol intake was 30 cc. Estimated blood loss 20 cc. Complications none.  The patient was stable on  transfer to the recovery room. She will be observed and discharged per anesthesia protocol. She is to follow up in the office at four to six weeks time or sooner for excessive fever, pain, bleeding, or any other concerns. She is to continue all her routine medications and to take Advil or Aleve as needed for any cramping. Dictated by:   Laqueta Linden, M.D. Attending Physician:  Jenean Lindau DD:  01/23/02 TD:  01/26/02 Job: [id number]

## 2011-01-19 NOTE — Assessment & Plan Note (Signed)
[facility]                             PULMONARY OFFICE NOTE   NAME:Mary Malone                       MRN:          [id number]  DATE:09/27/2006                            DOB:          Sep 03, 1946    ALLERGY FOLLOWUP:   PROBLEM:  Allergic rhinitis.   HISTORY:  Previous allergy patient at [facility], where I had last seen her in 2005.  She was seen here a year  ago by the nurse practitioner and comes now for prescription refill and  followup to maintain contact.  Skin tests had been positive in 1990 for  common inhalant allergens and she was on allergy vaccine around that  time.  Pulmonary function tests in 2004 had been normal.  Her primary  concern has been with nasal congestion, which has been worse over the  past year.  She asked to try some alternative to [facility], which she had  been using for years.  Otherwise, she has not had cough, chest tightness  or wheeze.  She has noted some increase in exertional dyspnea, which she  attributes to weight-gain.   MEDICATIONS:  1. Beconase nasal spray twice each nostril daily.  2. Prozac 40 mg.  3. Wellbutrin 300 mg.  4. Prempro.  5. Occasional Claritin.   No medication allergy.   She never smoked.   OBJECTIVE:  Weight 182 pounds, BP 130/84, pulse regular at 81, room air  saturation 97%.  She is somewhat heavy, but looks very comfortable.  Eyes, nose and throat are clear.  Lungs are clear to percussion and  auscultation.  Heart sounds regular, without murmur.  There is no edema  or cyanosis, no neck vein distention.   IMPRESSION:  1. Allergic rhinitis.  2. Exertional dyspnea, consistent with deconditioning.   PLAN:  1. Weight-loss and walk for endurance.  2. Sudafed-PE.  3. Try replacing Beconase with Nasonex one or two sprays each nostril      daily.  4. Schedule return in one year, earlier p.r.n.     Clinton D. Maple Hudson, MD, Tonny Bollman, FACP  Electronically  Signed    CDY/MedQ  DD: 09/27/2006  DT: 09/27/2006  Job #: [id number]

## 2011-01-19 NOTE — Op Note (Signed)
NAME:  Mary Malone, Mary Malone                          ACCOUNT NO.:  [id number]   MEDICAL RECORD NO.:  [id number]                   PATIENT TYPE:  AMB   LOCATION:  ENDO                                 FACILITY:  [facility]   PHYSICIAN:  Anselmo Rod, M.D.               DATE OF BIRTH:  10-Jan-1946   DATE OF PROCEDURE:  03/31/2004  DATE OF DISCHARGE:                                 OPERATIVE REPORT   PROCEDURE PERFORMED:  Screening colonoscopy.   ENDOSCOPIST:  Charna Elizabeth, M.D.   INSTRUMENT USED:  Olympus video colonoscope.   INDICATIONS FOR PROCEDURE:  The patient is a 65 year old white female  undergoing screening colonoscopy.  The patient has a history of diarrhea  with constipation alternating almost all of her adult life with occasional  bright red blood per rectum.  She notices blood in stool when she has to  strain.  Rule out colonic polyps, masses, etc.   PREPROCEDURE PREPARATION:  Informed consent was procured from the patient.  The patient was fasted for eight hours prior to the procedure and prepped  with a bottle of magnesium citrate and a gallon of GoLYTELY the night prior  to the procedure.   PREPROCEDURE PHYSICAL:  The patient had stable vital signs.  Neck supple.  Chest clear to auscultation.  S1 and S2 regular.  Abdomen soft with normal  bowel sounds.   DESCRIPTION OF PROCEDURE:  The patient was placed in left lateral decubitus  position and sedated with 70 mg of Demerol and 7.5 mg of Versed in slow  incremental doses.  Once the patient was adequately sedated and maintained  on low flow oxygen and continuous cardiac monitoring, the Olympus video  colonoscope was advanced from the rectum to the cecum.  The appendicular  orifice and ileocecal valve were clearly visualized and photographed.  A few  early scattered diverticula were seen in the sigmoid colon.  Retroflexion in  the rectum revealed small internal hemorrhoids.  Small external hemorrhoids  were seen on anal  inspection.  The patient tolerated the procedure well  without immediate complications.   IMPRESSION:  1. Few early sigmoid diverticula.  2. Small nonbleeding internal and external hemorrhoids.  3. Otherwise unrevealing colonoscopy.   RECOMMENDATIONS:  1. Continue high fiber diet with liberal fluid intake.  2. Repeat colonoscopy is recommended in the next 10 years unless the patient     develops any abnormal symptoms in the interim.  3. Outpatient followup as need arises in the future.                                               Anselmo Rod, M.D.    JNM/MEDQ  D:  03/31/2004  T:  03/31/2004  Job:  [id number]   cc:   Larita Fife  Kevin Fenton, M.D.  [address]  [place]  [place] [address]  Fax: [phone]

## 2011-04-03 ENCOUNTER — Other Ambulatory Visit: Payer: Self-pay | Admitting: Internal Medicine

## 2011-05-14 ENCOUNTER — Encounter: Payer: Self-pay | Admitting: Internal Medicine

## 2011-05-18 ENCOUNTER — Encounter: Payer: Self-pay | Admitting: Internal Medicine

## 2011-06-04 ENCOUNTER — Other Ambulatory Visit: Payer: Self-pay | Admitting: *Deleted

## 2011-06-04 MED ORDER — SOLIFENACIN SUCCINATE 5 MG PO TABS: 5.0000 mg | ORAL_TABLET | Freq: Every day | ORAL | Status: DC

## 2011-11-08 ENCOUNTER — Other Ambulatory Visit: Payer: Self-pay | Admitting: Internal Medicine

## 2011-11-21 ENCOUNTER — Ambulatory Visit (INDEPENDENT_AMBULATORY_CARE_PROVIDER_SITE_OTHER): Payer: 59 | Admitting: Internal Medicine

## 2011-11-21 ENCOUNTER — Encounter: Payer: Self-pay | Admitting: Internal Medicine

## 2011-11-21 VITALS — BP 158/82 | HR 78 | Temp 97.2°F | Ht 65.0 in | Wt 155.1 lb

## 2011-11-21 DIAGNOSIS — Z0000 Encounter for general adult medical examination without abnormal findings: Secondary | ICD-10-CM

## 2011-11-21 DIAGNOSIS — R32 Unspecified urinary incontinence: Secondary | ICD-10-CM

## 2011-11-21 MED ORDER — SOLIFENACIN SUCCINATE 5 MG PO TABS: 5.0000 mg | ORAL_TABLET | Freq: Every day | ORAL | Status: DC

## 2011-11-21 NOTE — Progress Notes (Signed)
Subjective:    Patient ID: Mary Malone, female    DOB: Oct 26, 1945, 66 y.o.   MRN: [id number]  HPI patient is here today for annual physical. Patient feels well and has no complaints.  Also reviewed chronic medical issues  urinary incontinence - started vesicare 10/2010 for same and doing well - previously urge symptoms>stress related incontinence   no dysuria, no hematuria, not improved with behavioral changes prior to med tx   allg rhinitis- freq pulm and sinus infx related to flares -  Works with Dr Maple Hudson (pulm) for same - uses otc meds to tx same as needed   vit d defic - follows with gyn for same - reports compliance with ongoing medical treatment and no changes in medication dose or frequency. denies adverse side effects related to current therapy.     dyslipidemia - has declined med tx suggested by gyn - understands need for diet and exercise to control same - no ASD complications in FH   Past Medical History  Diagnosis Date  . ALLERGIC RHINITIS   . DEPRESSION   . GERD   . HYPERLIPIDEMIA     diet control efforts  . HYPERTENSION   . MIGRAINE HEADACHE   . URINARY INCONTINENCE    Family History  Problem Relation Age of Onset  . Alzheimer's disease Mother   . Breast cancer Mother 75  . Allergies Mother   . Lung cancer Father   . Allergies Brother   . Diabetes Brother 65   History  Substance Use Topics  . Smoking status: Never Smoker   . Smokeless tobacco: Not on file  . Alcohol Use: Yes    Review of Systems Constitutional: Negative for fever or weight change.  Respiratory: Negative for cough and shortness of breath.   Cardiovascular: Negative for chest pain or palpitations.  Gastrointestinal: Negative for abdominal pain, no bowel changes.  Musculoskeletal: Negative for gait problem or joint swelling.  Skin: Negative for rash.  Neurological: Negative for dizziness or headache.  No other specific complaints in a complete review of systems (except as listed in  HPI above).     Objective:   Physical Exam BP 158/82  Pulse 78  Temp(Src) 97.2 F (36.2 C) (Oral)  Ht 5\' 5"  (1.651 m)  Wt 155 lb 1.9 oz (70.362 kg)  BMI 25.81 kg/m2  SpO2 97% Wt Readings from Last 3 Encounters:  11/21/11 155 lb 1.9 oz (70.362 kg)  11/01/10 163 lb 9.6 oz (74.208 kg)  09/21/10 164 lb 6.1 oz (74.563 kg)   Constitutional: She appears well-developed and well-nourished. No distress.  HENT: Head: Normocephalic and atraumatic. Ears: B TMs ok, no erythema or effusion; Nose: Nose normal. Mouth/Throat: Oropharynx is clear and moist. No oropharyngeal exudate.  Eyes: Conjunctivae and EOM are normal. Pupils are equal, round, and reactive to light. No scleral icterus.  Neck: Normal range of motion. Neck supple. No JVD present. No thyromegaly present.  Cardiovascular: Normal rate, regular rhythm and normal heart sounds.  No murmur heard. No BLE edema. Pulmonary/Chest: Effort normal and breath sounds normal. No respiratory distress. She has no wheezes.  Abdominal: Soft. Bowel sounds are normal. She exhibits no distension. There is no tenderness. no masses Musculoskeletal: Normal range of motion, no joint effusions. No gross deformities Neurological: She is alert and oriented to person, place, and time. No cranial nerve deficit. Coordination normal.  Skin: Skin is warm and dry. No rash noted. No erythema.  Psychiatric: She has a normal mood and affect. Her  behavior is normal. Judgment and thought content normal.   Lab Results  Component Value Date   CHOL 265 02/15/2010   HDL 68 02/15/2010   LDLCALC 170 02/15/2010        Assessment & Plan:   CPX/v70.0 - Patient has been counseled on age-appropriate routine health concerns for screening and prevention. These are reviewed and up-to-date. Immunizations are up-to-date or declined. Labs ordered and will be reviewed.

## 2011-11-21 NOTE — Patient Instructions (Signed)
It was good to see you today. We have reviewed your prior records including labs and tests today Test(s) ordered today. Return when you are fasting for these tests. Your results will be called to you after review (48-72hours after test completion). If any changes need to be made, you will be notified at that time. Health Maintenance reviewed - everything else is up to date. Medications reviewed, no changes at this time. Refill on medication(s) as discussed today. Please schedule followup in 1 year, call sooner if problems.

## 2011-11-21 NOTE — Assessment & Plan Note (Signed)
Began Vesicare 10/2100 - doing well The current medical regimen is effective;  continue present plan and medications.

## 2011-11-27 ENCOUNTER — Other Ambulatory Visit (INDEPENDENT_AMBULATORY_CARE_PROVIDER_SITE_OTHER): Payer: 59

## 2011-11-27 DIAGNOSIS — Z0000 Encounter for general adult medical examination without abnormal findings: Secondary | ICD-10-CM

## 2011-11-27 LAB — BASIC METABOLIC PANEL
BUN: 19 mg/dL (ref 6–23)
CO2: 27 mEq/L (ref 19–32)
Calcium: 9.3 mg/dL (ref 8.4–10.5)
Chloride: 106 mEq/L (ref 96–112)
Creatinine, Ser: 0.7 mg/dL (ref 0.4–1.2)
GFR: 83.78 mL/min (ref >60.00)
Glucose, Bld: 97 mg/dL (ref 70–99)
Potassium: 4.1 mEq/L (ref 3.5–5.1)
Sodium: 140 mEq/L (ref 135–145)

## 2011-11-27 LAB — CBC WITH DIFFERENTIAL/PLATELET
Basophils Absolute: 0 10*3/uL (ref 0.0–0.1)
Basophils Relative: 0.7 % (ref 0.0–3.0)
Eosinophils Absolute: 0.2 10*3/uL (ref 0.0–0.7)
Eosinophils Relative: 3.9 % (ref 0.0–5.0)
HCT: 39.8 % (ref 36.0–46.0)
Hemoglobin: 13.6 g/dL (ref 12.0–15.0)
Lymphocytes Relative: 32.7 % (ref 12.0–46.0)
Lymphs Abs: 1.9 10*3/uL (ref 0.7–4.0)
MCHC: 34.1 g/dL (ref 30.0–36.0)
MCV: 93.7 fl (ref 78.0–100.0)
Monocytes Absolute: 0.4 10*3/uL (ref 0.1–1.0)
Monocytes Relative: 7.5 % (ref 3.0–12.0)
Neutro Abs: 3.2 10*3/uL (ref 1.4–7.7)
Neutrophils Relative %: 55.2 % (ref 43.0–77.0)
Platelets: 278 10*3/uL (ref 150.0–400.0)
RBC: 4.25 Mil/uL (ref 3.87–5.11)
RDW: 12.5 % (ref 11.5–14.6)
WBC: 5.7 10*3/uL (ref 4.5–10.5)

## 2011-11-27 LAB — URINALYSIS
Bilirubin Urine: NEGATIVE
Ketones, ur: NEGATIVE
Leukocytes, UA: NEGATIVE
Nitrite: NEGATIVE
Specific Gravity, Urine: 1.02 (ref 1.000–1.030)
Total Protein, Urine: NEGATIVE
Urine Glucose: NEGATIVE
Urobilinogen, UA: 0.2 (ref 0.0–1.0)
pH: 5.5 (ref 5.0–8.0)

## 2011-11-27 LAB — HEPATIC FUNCTION PANEL
ALT: 16 U/L (ref 0–35)
AST: 16 U/L (ref 0–37)
Albumin: 3.7 g/dL (ref 3.5–5.2)
Alkaline Phosphatase: 48 U/L (ref 39–117)
Bilirubin, Direct: 0.1 mg/dL (ref 0.0–0.3)
Total Bilirubin: 0.5 mg/dL (ref 0.3–1.2)
Total Protein: 7.1 g/dL (ref 6.0–8.3)

## 2011-11-27 LAB — TSH: TSH: 4.41 u[IU]/mL (ref 0.35–5.50)

## 2011-11-27 LAB — LIPID PANEL
Cholesterol: 199 mg/dL (ref 0–200)
HDL: 65.5 mg/dL (ref >39.00)
LDL Cholesterol: 111 mg/dL — ABNORMAL HIGH (ref 0–99)
Total CHOL/HDL Ratio: 3
Triglycerides: 113 mg/dL (ref 0.0–149.0)
VLDL: 22.6 mg/dL (ref 0.0–40.0)

## 2011-12-03 ENCOUNTER — Telehealth: Payer: Self-pay | Admitting: *Deleted

## 2011-12-03 DIAGNOSIS — R32 Unspecified urinary incontinence: Secondary | ICD-10-CM

## 2011-12-03 MED ORDER — SOLIFENACIN SUCCINATE 5 MG PO TABS: 5.0000 mg | ORAL_TABLET | Freq: Every day | ORAL | Status: DC

## 2011-12-03 NOTE — Telephone Encounter (Signed)
Left msg on vm received letter from [facility] stating not able to locate member. Pt states she was using [facility] before was change over to [facility]. Inform pt will resend vesicare rx along with member # to [employer].... 12/03/11@4 :10pm/LMB

## 2011-12-10 ENCOUNTER — Other Ambulatory Visit: Payer: Self-pay | Admitting: Obstetrics and Gynecology

## 2011-12-10 DIAGNOSIS — Z1231 Encounter for screening mammogram for malignant neoplasm of breast: Secondary | ICD-10-CM

## 2011-12-28 ENCOUNTER — Encounter: Payer: Self-pay | Admitting: Internal Medicine

## 2011-12-28 ENCOUNTER — Ambulatory Visit (INDEPENDENT_AMBULATORY_CARE_PROVIDER_SITE_OTHER): Payer: 59 | Admitting: Internal Medicine

## 2011-12-28 VITALS — BP 150/82 | HR 73 | Ht 66.0 in | Wt 156.4 lb

## 2011-12-28 DIAGNOSIS — J309 Allergic rhinitis, unspecified: Secondary | ICD-10-CM

## 2011-12-28 MED ORDER — MOMETASONE FUROATE 50 MCG/ACT NA SUSP: 2.0000 | Freq: Every day | NASAL | Status: DC

## 2011-12-28 MED ORDER — PHENYLEPHRINE HCL 1 % NA SOLN
3.0000 [drp] | Freq: Four times a day (QID) | NASAL | Status: DC | PRN
  Administered 2011-12-28: 3 [drp] via NASAL

## 2011-12-28 MED ORDER — METHYLPREDNISOLONE ACETATE 80 MG/ML IJ SUSP
80.0000 mg | Freq: Once | INTRAMUSCULAR | Status: AC
  Administered 2011-12-28: 80 mg via INTRAMUSCULAR

## 2011-12-28 NOTE — Patient Instructions (Signed)
Neb neo nasal  Depo 80  Sample Dymista nasal spray-   1 or 2 puffs each nostril once daily at bedtime  Script printed for Nasonex, to start back after Dymista finished. If you would prefer to stay with Dymista, we can send in a script for that.   consider Allegra-D/ fexofenadine-D, as an otc antihistamine alternative to Claritin-D

## 2011-12-28 NOTE — Progress Notes (Signed)
12/28/11- 53 yoF never smoker (widow- husb died of lung Ca), followed for allergic rhinitis, complicated by GERD. And nasal congestion, perennial, worse lying down. Sometimes makes her get up. Fluid feeling in the ears without popping. Ears itch. Pollen is making her sneeze and cough without wheeze. Postnasal drip varies. Denies headache, purulent discharge or fever. Taking occasional Claritin-D and Nasonex does help.  ROS-see HPI Constitutional:   No-   weight loss, night sweats, fevers, chills, fatigue, lassitude. HEENT:   No-  headaches, difficulty swallowing, tooth/dental problems, sore throat,       + sneezing, itching, ear ache, nasal congestion, post nasal drip,  CV:  No-   chest pain, orthopnea, PND, swelling in lower extremities, anasarca,  dizziness, palpitations Resp: No-   shortness of breath with exertion or at rest.              No-   productive cough,  No non-productive cough,  No- coughing up of blood.              No-   change in color of mucus.  No- wheezing.   Skin: No-   rash or lesions. GI:  No-   heartburn, indigestion, abdominal pain, nausea, vomiting,  GU:  MS:  No-   joint pain or swelling.   Neuro-     nothing unusual Psych:  No- change in mood or affect. No depression or anxiety.  No memory loss.  OBJ- Physical Exam General- Alert, Oriented, Affect-appropriate, Distress- none acute Skin- rash-none, lesions- none, excoriation- none Lymphadenopathy- none Head- atraumatic            Eyes- Gross vision intact, PERRLA, conjunctivae and secretions clear            Ears- Hearing, canals-normal            Nose- turbinate edema, no-Septal dev, mucus, polyps, erosion, perforation             Throat- Mallampati II , mucosa clear , drainage- none, tonsils- atrophic Neck- flexible , trachea midline, no stridor , thyroid nl, carotid no bruit Chest - symmetrical excursion , unlabored           Heart/CV- RRR , no murmur , no gallop  , no rub, nl s1 s2  - JVD- none , edema- none, stasis changes- none, varices- none           Lung- clear to P&A, wheeze- none, cough- none , dullness-none, rub- none           Chest wall-  Abd-  Br/ Gen/ Rectal- Not done, not indicated Extrem- cyanosis- none, clubbing, none, atrophy- none, strength- nl Neuro- grossly intact to observation

## 2011-12-31 NOTE — Assessment & Plan Note (Signed)
Seasonal exacerbation with eustachian dysfunction Plan-nasal nebulizer, Depo-Medrol, sample Dymista nasal spray, refill Nasonex as alternative, Allegra-D

## 2012-01-14 ENCOUNTER — Telehealth: Payer: Self-pay | Admitting: Internal Medicine

## 2012-01-14 MED ORDER — AZELASTINE-FLUTICASONE 137-50 MCG/ACT NA SUSP: 1.0000 | Freq: Every day | NASAL | Status: DC

## 2012-01-14 NOTE — Telephone Encounter (Signed)
rx sent. Pt is aware. Mary Malone, CMA  

## 2012-01-15 ENCOUNTER — Telehealth: Payer: Self-pay | Admitting: Internal Medicine

## 2012-01-15 MED ORDER — MOMETASONE FUROATE 50 MCG/ACT NA SUSP: 1.0000 | Freq: Every day | NASAL | Status: DC

## 2012-01-15 MED ORDER — AZELASTINE HCL 0.1 % NA SOLN: 1.0000 | Freq: Every day | NASAL | Status: DC

## 2012-01-15 NOTE — Telephone Encounter (Signed)
Called spoke with patient, advised of [facility]'s recs to add the astepro to the nasonex and the directions for both.  Pt okay with these recs and verbalized her understanding.  Instructions on nasonex changed with refills, astepro sent to verified pharmacy, dymista dc'd.

## 2012-01-15 NOTE — Telephone Encounter (Signed)
Per CY-okay to add Astepro #1 1 puff each nostril along with her Nasonex each night at bedtime with prn refills.

## 2012-01-15 NOTE — Telephone Encounter (Signed)
Called spoke with patient who stated that her insurance does not cover dymista.  Pt is aware that [facility] refilled her nasonex but pt feels that the dymista works better than the nasonex and her pharmacist stated that she could try the two components of the dymista separately for the same effect.  Pt is requesting to know [facility]'s take on this and is requesting rx's if this is okay.  Dr Maple Hudson please advise, thanks.  [facility] on [address]. NK[facility] - verified.

## 2012-01-16 ENCOUNTER — Ambulatory Visit
Admission: RE | Admit: 2012-01-16 | Discharge: 2012-01-16 | Disposition: A | Discharge status: Home / Self Care | Payer: 59 | Source: Ambulatory Visit | Attending: Obstetrics and Gynecology | Admitting: Obstetrics and Gynecology

## 2012-01-16 DIAGNOSIS — Z1231 Encounter for screening mammogram for malignant neoplasm of breast: Secondary | ICD-10-CM

## 2012-01-18 ENCOUNTER — Ambulatory Visit: Payer: 59 | Admitting: Internal Medicine

## 2012-12-08 ENCOUNTER — Other Ambulatory Visit: Payer: Self-pay

## 2012-12-08 DIAGNOSIS — Z1231 Encounter for screening mammogram for malignant neoplasm of breast: Secondary | ICD-10-CM

## 2013-01-16 ENCOUNTER — Ambulatory Visit
Admission: RE | Admit: 2013-01-16 | Discharge: 2013-01-16 | Disposition: A | Discharge status: Home / Self Care | Payer: Medicare Other | Source: Ambulatory Visit

## 2013-01-16 DIAGNOSIS — Z1231 Encounter for screening mammogram for malignant neoplasm of breast: Secondary | ICD-10-CM

## 2013-01-19 ENCOUNTER — Other Ambulatory Visit: Payer: Self-pay | Admitting: Obstetrics and Gynecology

## 2013-01-19 ENCOUNTER — Telehealth: Payer: Self-pay | Admitting: *Deleted

## 2013-01-19 DIAGNOSIS — R928 Other abnormal and inconclusive findings on diagnostic imaging of breast: Secondary | ICD-10-CM

## 2013-01-19 NOTE — Telephone Encounter (Signed)
Form in your cabinet to sign for [facility] for further testing. Medicare patient. Mary Malone

## 2013-01-22 ENCOUNTER — Encounter: Payer: Self-pay | Admitting: Internal Medicine

## 2013-01-22 ENCOUNTER — Ambulatory Visit (INDEPENDENT_AMBULATORY_CARE_PROVIDER_SITE_OTHER): Payer: Medicare Other | Admitting: Internal Medicine

## 2013-01-22 VITALS — BP 126/72 | HR 68 | Ht 65.0 in | Wt 146.0 lb

## 2013-01-22 DIAGNOSIS — J309 Allergic rhinitis, unspecified: Secondary | ICD-10-CM

## 2013-01-22 DIAGNOSIS — J302 Other seasonal allergic rhinitis: Secondary | ICD-10-CM

## 2013-01-22 DIAGNOSIS — J3089 Other allergic rhinitis: Secondary | ICD-10-CM

## 2013-01-22 DIAGNOSIS — K219 Gastro-esophageal reflux disease without esophagitis: Secondary | ICD-10-CM

## 2013-01-22 MED ORDER — MOMETASONE FUROATE 50 MCG/ACT NA SUSP: 1.0000 | Freq: Every day | NASAL | Status: DC

## 2013-01-22 MED ORDER — AZELASTINE HCL 0.1 % NA SOLN: 1.0000 | Freq: Every day | NASAL | Status: DC

## 2013-01-22 NOTE — Progress Notes (Signed)
12/28/11- 16 yoF never smoker (widow- husb died of lung Ca), followed for allergic rhinitis, complicated by GERD. And nasal congestion, perennial, worse lying down. Sometimes makes her get up. Fluid feeling in the ears without popping. Ears itch. Pollen is making her sneeze and cough without wheeze. Postnasal drip varies. Denies headache, purulent discharge or fever. Taking occasional Claritin-D and Nasonex does help.  01/22/13- 8 yoF never smoker (widow- husb died of lung Ca), followed for allergic rhinitis, complicated by GERD. FOLLOWS FOR: throat feels fuller than usual when outside(feels like a pollen collection) and having dizzy spells from ears (both). Almost feels like there is fluid in the ears. REFILLS for 90 day supply for Nasonex and Astepro sent to pharmacy on file. Pollen season only caused throat irritation this year. Some pressure in her ears made her feel unsteady. Today she feels well. Likes Nasonex. Denies cough. Aware of chronic GERD. Takes Prilosec every other day as a compromise to minimize bone loss.  ROS-see HPI Constitutional:   No-   weight loss, night sweats, fevers, chills, fatigue, lassitude. HEENT:   No-  headaches, difficulty swallowing, tooth/dental problems, sore throat,       + sneezing, itching, +ear ache, nasal congestion, post nasal drip,  CV:  No-   chest pain, orthopnea, PND, swelling in lower extremities, anasarca,  dizziness, palpitations Resp: No-   shortness of breath with exertion or at rest.              No-   productive cough,  No non-productive cough,  No- coughing up of blood.              No-   change in color of mucus.  No- wheezing.   Skin: No-   rash or lesions. GI:  +  heartburn, indigestion,no- abdominal pain, nausea, vomiting,  GU:  MS:  No-   joint pain or swelling.   Neuro-     nothing unusual Psych:  No- change in mood or affect. No depression or anxiety.  No memory loss.  OBJ- Physical Exam General- Alert, Oriented, Affect-appropriate,  Distress- none acute Skin- rash-none, lesions- none, excoriation- none Lymphadenopathy- none Head- atraumatic            Eyes- Gross vision intact, PERRLA, conjunctivae and secretions clear            Ears- Hearing, canals-normal            Nose- turbinate edema, no-Septal dev, mucus, polyps, erosion, perforation             Throat- Mallampati II , mucosa +red , drainage- none, tonsils- atrophic Neck- flexible , trachea midline, no stridor , thyroid nl, carotid no bruit Chest - symmetrical excursion , unlabored           Heart/CV- RRR , no murmur , no gallop  , no rub, nl s1 s2                           - JVD- none , edema- none, stasis changes- none, varices- none           Lung- clear to P&A, wheeze- none, cough- none , dullness-none, rub- none           Chest wall-  Abd-  Br/ Gen/ Rectal- Not done, not indicated Extrem- cyanosis- none, clubbing, none, atrophy- none, strength- nl Neuro- grossly intact to observation

## 2013-01-22 NOTE — Patient Instructions (Addendum)
Scripts sent for Nasonex and Astepro  Consider alternating Pepcid with Prilosec for better acid reflux control  Please call as needed

## 2013-01-28 ENCOUNTER — Other Ambulatory Visit (INDEPENDENT_AMBULATORY_CARE_PROVIDER_SITE_OTHER): Payer: Medicare Other

## 2013-01-28 ENCOUNTER — Ambulatory Visit (INDEPENDENT_AMBULATORY_CARE_PROVIDER_SITE_OTHER): Payer: Medicare Other | Admitting: Internal Medicine

## 2013-01-28 ENCOUNTER — Encounter: Payer: Self-pay | Admitting: Internal Medicine

## 2013-01-28 VITALS — BP 132/82 | HR 75 | Temp 97.3°F | Wt 141.1 lb

## 2013-01-28 DIAGNOSIS — E785 Hyperlipidemia, unspecified: Secondary | ICD-10-CM

## 2013-01-28 DIAGNOSIS — Z0000 Encounter for general adult medical examination without abnormal findings: Secondary | ICD-10-CM

## 2013-01-28 DIAGNOSIS — R32 Unspecified urinary incontinence: Secondary | ICD-10-CM

## 2013-01-28 DIAGNOSIS — Z23 Encounter for immunization: Secondary | ICD-10-CM

## 2013-01-28 LAB — CBC WITH DIFFERENTIAL/PLATELET
Basophils Absolute: 0.1 10*3/uL (ref 0.0–0.1)
Basophils Relative: 1.3 % (ref 0.0–3.0)
Eosinophils Absolute: 0.3 10*3/uL (ref 0.0–0.7)
Eosinophils Relative: 4.6 % (ref 0.0–5.0)
HCT: 41.7 % (ref 36.0–46.0)
Hemoglobin: 14.5 g/dL (ref 12.0–15.0)
Lymphocytes Relative: 30 % (ref 12.0–46.0)
Lymphs Abs: 2.2 10*3/uL (ref 0.7–4.0)
MCHC: 34.9 g/dL (ref 30.0–36.0)
MCV: 92.8 fl (ref 78.0–100.0)
Monocytes Absolute: 0.5 10*3/uL (ref 0.1–1.0)
Monocytes Relative: 7.5 % (ref 3.0–12.0)
Neutro Abs: 4.1 10*3/uL (ref 1.4–7.7)
Neutrophils Relative %: 56.6 % (ref 43.0–77.0)
Platelets: 269 10*3/uL (ref 150.0–400.0)
RBC: 4.49 Mil/uL (ref 3.87–5.11)
RDW: 12.4 % (ref 11.5–14.6)
WBC: 7.2 10*3/uL (ref 4.5–10.5)

## 2013-01-28 LAB — LIPID PANEL
Cholesterol: 224 mg/dL — ABNORMAL HIGH (ref 0–200)
HDL: 79.6 mg/dL (ref >39.00)
Total CHOL/HDL Ratio: 3
Triglycerides: 74 mg/dL (ref 0.0–149.0)
VLDL: 14.8 mg/dL (ref 0.0–40.0)

## 2013-01-28 LAB — URINALYSIS, ROUTINE W REFLEX MICROSCOPIC
Bilirubin Urine: NEGATIVE
Ketones, ur: NEGATIVE
Leukocytes, UA: NEGATIVE
Nitrite: NEGATIVE
Specific Gravity, Urine: 1.015 (ref 1.000–1.030)
Total Protein, Urine: NEGATIVE
Urine Glucose: NEGATIVE
Urobilinogen, UA: 0.2 (ref 0.0–1.0)
pH: 6 (ref 5.0–8.0)

## 2013-01-28 LAB — BASIC METABOLIC PANEL
BUN: 15 mg/dL (ref 6–23)
CO2: 28 mEq/L (ref 19–32)
Calcium: 9.6 mg/dL (ref 8.4–10.5)
Chloride: 106 mEq/L (ref 96–112)
Creatinine, Ser: 0.8 mg/dL (ref 0.4–1.2)
GFR: 73.12 mL/min (ref >60.00)
Glucose, Bld: 87 mg/dL (ref 70–99)
Potassium: 4.1 mEq/L (ref 3.5–5.1)
Sodium: 140 mEq/L (ref 135–145)

## 2013-01-28 LAB — HEPATIC FUNCTION PANEL
ALT: 19 U/L (ref 0–35)
AST: 21 U/L (ref 0–37)
Albumin: 4.1 g/dL (ref 3.5–5.2)
Alkaline Phosphatase: 42 U/L (ref 39–117)
Bilirubin, Direct: 0.1 mg/dL (ref 0.0–0.3)
Total Bilirubin: 0.8 mg/dL (ref 0.3–1.2)
Total Protein: 7.3 g/dL (ref 6.0–8.3)

## 2013-01-28 LAB — LDL CHOLESTEROL, DIRECT: Direct LDL: 132.3 mg/dL

## 2013-01-28 LAB — TSH: TSH: 3.13 u[IU]/mL (ref 0.35–5.50)

## 2013-01-28 MED ORDER — MIRABEGRON ER 25 MG PO TB24: 25.0000 mg | ORAL_TABLET | Freq: Every day | ORAL | Status: DC

## 2013-01-28 NOTE — Assessment & Plan Note (Signed)
Began Vesicare 10/2010 - initially improved stopped due to no refills and constipation side effects Increase OAB symptoms off med -  Try Myrbetriq - erx done

## 2013-01-28 NOTE — Assessment & Plan Note (Signed)
Never on meds Check lipids annually and consider tx as needed

## 2013-01-28 NOTE — Patient Instructions (Signed)
It was good to see you today. We have reviewed your prior records including labs and tests today Health Maintenance reviewed - tetanus updated today - all other recommended immunizations and age-appropriate screenings are up-to-date.  Test(s) ordered today. Your results will be released to MyChart (or called to you) after review, usually within 72hours after test completion. If any changes need to be made, you will be notified at that same time. Medications reviewed and updated. Try Myrbetriq once daily in place of prior Vesicare for overactive bladder symptoms - 2 week sample given to you today and prescription sent to your pharmacy Please schedule followup in 12 months for annual wellness and labs, call sooner if problems.  Health Maintenance, Females A healthy lifestyle and preventative care can promote health and wellness.  Maintain regular health, dental, and eye exams.  Eat a healthy diet. Foods like vegetables, fruits, whole grains, low-fat dairy products, and lean protein foods contain the nutrients you need without too many calories. Decrease your intake of foods high in solid fats, added sugars, and salt. Get information about a proper diet from your caregiver, if necessary.  Regular physical exercise is one of the most important things you can do for your health. Most adults should get at least 150 minutes of moderate-intensity exercise (any activity that increases your heart rate and causes you to sweat) each week. In addition, most adults need muscle-strengthening exercises on 2 or more days a week.   Maintain a healthy weight. The body mass index (BMI) is a screening tool to identify possible weight problems. It provides an estimate of body fat based on height and weight. Your caregiver can help determine your BMI, and can help you achieve or maintain a healthy weight. For adults 20 years and older:  A BMI below 18.5 is considered underweight.  A BMI of 18.5 to 24.9 is normal.  A  BMI of 25 to 29.9 is considered overweight.  A BMI of 30 and above is considered obese.  Maintain normal blood lipids and cholesterol by exercising and minimizing your intake of saturated fat. Eat a balanced diet with plenty of fruits and vegetables. Blood tests for lipids and cholesterol should begin at age 6 and be repeated every 5 years. If your lipid or cholesterol levels are high, you are over 50, or you are a high risk for heart disease, you may need your cholesterol levels checked more frequently.Ongoing high lipid and cholesterol levels should be treated with medicines if diet and exercise are not effective.  If you smoke, find out from your caregiver how to quit. If you do not use tobacco, do not start.  If you are pregnant, do not drink alcohol. If you are breastfeeding, be very cautious about drinking alcohol. If you are not pregnant and choose to drink alcohol, do not exceed 1 drink per day. One drink is considered to be 12 ounces (355 mL) of beer, 5 ounces (148 mL) of wine, or 1.5 ounces (44 mL) of liquor.  Avoid use of street drugs. Do not share needles with anyone. Ask for help if you need support or instructions about stopping the use of drugs.  High blood pressure causes heart disease and increases the risk of stroke. Blood pressure should be checked at least every 1 to 2 years. Ongoing high blood pressure should be treated with medicines, if weight loss and exercise are not effective.  If you are 62 to 66 years old, ask your caregiver if you should take aspirin  to prevent strokes.  Diabetes screening involves taking a blood sample to check your fasting blood sugar level. This should be done once every 3 years, after age 26, if you are within normal weight and without risk factors for diabetes. Testing should be considered at a younger age or be carried out more frequently if you are overweight and have at least 1 risk factor for diabetes.  Breast cancer screening is essential  preventative care for women. You should practice "breast self-awareness." This means understanding the normal appearance and feel of your breasts and may include breast self-examination. Any changes detected, no matter how small, should be reported to a caregiver. Women in their 19s and 30s should have a clinical breast exam (CBE) by a caregiver as part of a regular health exam every 1 to 3 years. After age 20, women should have a CBE every year. Starting at age 78, women should consider having a mammogram (breast X-ray) every year. Women who have a family history of breast cancer should talk to their caregiver about genetic screening. Women at a high risk of breast cancer should talk to their caregiver about having an MRI and a mammogram every year.  The Pap test is a screening test for cervical cancer. Women should have a Pap test starting at age 5. Between ages 74 and 77, Pap tests should be repeated every 2 years. Beginning at age 83, you should have a Pap test every 3 years as long as the past 3 Pap tests have been normal. If you had a hysterectomy for a problem that was not cancer or a condition that could lead to cancer, then you no longer need Pap tests. If you are between ages 56 and 36, and you have had normal Pap tests going back 10 years, you no longer need Pap tests. If you have had past treatment for cervical cancer or a condition that could lead to cancer, you need Pap tests and screening for cancer for at least 20 years after your treatment. If Pap tests have been discontinued, risk factors (such as a new sexual partner) need to be reassessed to determine if screening should be resumed. Some women have medical problems that increase the chance of getting cervical cancer. In these cases, your caregiver may recommend more frequent screening and Pap tests.  The human papillomavirus (HPV) test is an additional test that may be used for cervical cancer screening. The HPV test looks for the virus that  can cause the cell changes on the cervix. The cells collected during the Pap test can be tested for HPV. The HPV test could be used to screen women aged 40 years and older, and should be used in women of any age who have unclear Pap test results. After the age of 65, women should have HPV testing at the same frequency as a Pap test.  Colorectal cancer can be detected and often prevented. Most routine colorectal cancer screening begins at the age of 64 and continues through age 52. However, your caregiver may recommend screening at an earlier age if you have risk factors for colon cancer. On a yearly basis, your caregiver may provide home test kits to check for hidden blood in the stool. Use of a small camera at the end of a tube, to directly examine the colon (sigmoidoscopy or colonoscopy), can detect the earliest forms of colorectal cancer. Talk to your caregiver about this at age 14, when routine screening begins. Direct examination of the colon should  be repeated every 5 to 10 years through age 63, unless early forms of pre-cancerous polyps or small growths are found.  Hepatitis C blood testing is recommended for all people born from [birth year] through 1964 and any individual with known risks for hepatitis C.  Practice safe sex. Use condoms and avoid high-risk sexual practices to reduce the spread of sexually transmitted infections (STIs). Sexually active women aged 15 and younger should be checked for Chlamydia, which is a common sexually transmitted infection. Older women with new or multiple partners should also be tested for Chlamydia. Testing for other STIs is recommended if you are sexually active and at increased risk.  Osteoporosis is a disease in which the bones lose minerals and strength with aging. This can result in serious bone fractures. The risk of osteoporosis can be identified using a bone density scan. Women ages 55 and over and women at risk for fractures or osteoporosis should discuss  screening with their caregivers. Ask your caregiver whether you should be taking a calcium supplement or vitamin D to reduce the rate of osteoporosis.  Menopause can be associated with physical symptoms and risks. Hormone replacement therapy is available to decrease symptoms and risks. You should talk to your caregiver about whether hormone replacement therapy is right for you.  Use sunscreen with a sun protection factor (SPF) of 30 or greater. Apply sunscreen liberally and repeatedly throughout the day. You should seek shade when your shadow is shorter than you. Protect yourself by wearing long sleeves, pants, a wide-brimmed hat, and sunglasses year round, whenever you are outdoors.  Notify your caregiver of new moles or changes in moles, especially if there is a change in shape or color. Also notify your caregiver if a mole is larger than the size of a pencil eraser.  Stay current with your immunizations. Document Released: 03/05/2011 Document Revised: 11/12/2011 Document Reviewed: 03/05/2011 [facility] Patient Information 2014 [place], [place]. Overactive Bladder, Adult The bladder has two functions that are totally opposite of the other. One is to relax and stretch out so it can store urine (fills like a balloon), and the other is to contract and squeeze down so that it can empty the urine that it has stored. Proper functioning of the bladder is a complex mixing of these two functions. The filling and emptying of the bladder can be influenced by:  The bladder.  The spinal cord.  The brain.  The nerves going to the bladder.  Other organs that are closely related to the bladder such as prostate in males and the vagina in females. As your bladder fills with urine, nerve signals are sent from the bladder to the brain to tell you that you may need to urinate. Normal urination requires that the bladder squeeze down with sufficient strength to empty the bladder, but this also requires that the  bladder squeeze down sufficiently long to finish the job. In addition the sphincter muscles, which normally keep you from leaking urine, must also relax so that the urine can pass. Coordination between the bladder muscle squeezing down and the sphincter muscles relaxing is required to make everything happen normally. With an overactive bladder sometimes the muscles of the bladder contract unexpectedly and involuntarily and this causes an urgent need to urinate. The normal response is to try to hold urine in by contracting the sphincter muscles. Sometimes the bladder contracts so strongly that the sphincter muscles cannot stop the urine from passing out and incontinence occurs. This kind of incontinence is called  urge incontinence. Having an overactive bladder can be embarrassing and awkward. It can keep you from living life the way you want to. Many people think it is just something you have to put up with as you grow older or have certain health conditions. In fact, there are treatments that can help make your life easier and more pleasant. CAUSES  Many things can cause an overactive bladder. Possibilities include:  Urinary tract infection or infection of nearby tissues such as the prostate.  Prostate enlargement.  In women, multiple pregnancies or surgery on the uterus or urethra.  Bladder stones, inflammation or tumors.  Caffeine.  Alcohol.  Medications. For example, diuretics (drugs that help the body get rid of extra fluid) increase urine production. Some other medicines must be taken with lots of fluids.  Muscle or nerve weakness. This might be the result of a spinal cord injury, a stroke, multiple sclerosis or Parkinson's disease.  Diabetes can cause a high urine volume which fills the bladder so quickly that the normal urge to urinate is triggered very strongly. SYMPTOMS   Loss of bladder control. You feel the need to urinate and cannot make your body wait.  Sudden, strong urges to  urinate.  Urinating 8 or more times a day.  Waking up to urinate two or more times a night. DIAGNOSIS  To decide if you have overactive bladder, your healthcare provider will probably:  Ask about symptoms you have noticed.  Ask about your overall health. This will include questions about any medications you are taking.  Do a physical examination. This will help determine if there are obvious blockages or other problems.  Order some tests. These might include:  A blood test to check for diabetes or other health issues that could be contributing to the problem.  Urine testing. This could measure the flow of urine and the pressure on the bladder.  A test of your neurological system (the brain, spinal cord and nerves). This is the system that senses the need to urinate. Some of these tests are called flow tests, bladder pressure tests and electrical measurements of the sphincter muscle.  A bladder test to check whether it is emptying completely when you urinate.  Cytoscopy. This test uses a thin tube with a tiny camera on it. It offers a look inside your urethra and bladder to see if there are problems.  Imaging tests. You might be given a contrast dye and then asked to urinate. X-rays are taken to see how your bladder is working. TREATMENT  An overactive bladder can be treated in many ways. The treatment will depend on the cause. Whether you have a mild or severe case also makes a difference. Often, treatment can be given in your healthcare provider's office or clinic. Be sure to discuss the different options with your caregiver. They include:  Behavioral treatments. These do not involve medication or surgery:  Bladder training. For this, you would follow a schedule to urinate at regular intervals. This helps you learn to control the urge to urinate. At first, you might be asked to wait a few minutes after feeling the urge. In time, you should be able to schedule bathroom visits an hour  or more apart.  Kegel exercises. These exercises strengthen the pelvic floor muscles, which support the bladder. By toning these muscles, they can help control urination, even if the bladder muscles are overactive. A specialist will teach you how to do these exercises correctly. They will require daily practice.  Weight loss. If you are obese or overweight, losing weight might stop your bladder from being overactive. Talk to your healthcare provider about how many pounds you should lose. Also ask if there is a specific program or method that would work best for you.  Diet change. This might be suggested if constipation is making your overactive bladder worse. Your healthcare provider or a nutritionist can explain ways to change what you eat to ease constipation. Other people might need to take in less caffeine or alcohol. Sometimes drinking fewer fluids is needed, too.  Protection. This is not an actual treatment. But, you could wear special pads to take care of any leakage while you wait for other treatments to take effect. This will help you avoid embarrassment.  Physical treatments.  Electrical stimulation. Electrodes will send gentle pulses to the nerves or muscles that help control the bladder. The goal is to strengthen them. Sometimes this is done with the electrodes outside of the body. Or, they might be placed inside the body (implanted). This treatment can take several months to have an effect.  Medications. These are usually used along with other treatments. Several medicines are available. Some are injected into the muscles involved in urination. Others come in pill form. Medications sometimes prescribed include:  Anticholinergics. These drugs block the signals that the nerves deliver to the bladder. This keeps it from releasing urine at the wrong time. Researchers think the drugs might help in other ways, too.  Imipramine. This is an antidepressant. But, it relaxes bladder  muscles.  Botox. This is still experimental. Some people believe that injecting it into the bladder muscles will relax them so they work more normally. It has also been injected into the sphincter muscle when the sphincter muscle does not open properly. This is a temporary fix, however. Also, it might make matters worse, especially in older people.  Surgery.  A device might be implanted to help manage your nerves. It works on the nerves that signal when you need to urinate.  Surgery is sometimes needed with electrical stimulation. If the electrodes are implanted, this is done through surgery.  Sometimes repairs need to be made through surgery. For example, the size of the bladder can be changed. This is usually done in severe cases only. HOME CARE INSTRUCTIONS   Take any medications your healthcare provider prescribed or suggested. Follow the directions carefully.  Practice any lifestyle changes that are recommended. These might include:  Drinking less fluid or drinking at different times of the day. If you need to urinate often during the night, for example, you may need to stop drinking fluids early in the evening.  Cutting down on caffeine or alcohol. They can both make an overactive bladder worse. Caffeine is found in coffee, tea and sodas.  Doing Kegel exercises to strengthen muscles.  Losing weight, if that is recommended.  Eating a healthy and balanced diet. This will help you avoid constipation.  Keep a journal or a log. You might be asked to record how much you drink and when, and also when you feel the need to urinate.  Learn how to care for implants or other devices, such as pessaries. SEEK MEDICAL CARE IF:   Your overactive bladder gets worse.  You feel increased pain or irritation when you urinate.  You notice blood in your urine.  You have questions about any medications or devices that your healthcare provider recommended.  You notice blood, pus or swelling at  the site of  any test or treatment procedure.  You have an oral temperature above 102 F (38.9 C). SEEK IMMEDIATE MEDICAL CARE IF:  You have an oral temperature above 102 F (38.9 C), not controlled by medicine. Document Released: 06/16/2009 Document Revised: 11/12/2011 Document Reviewed: 06/16/2009 [facility] Patient Information 2014 [place], [place].

## 2013-01-28 NOTE — Progress Notes (Signed)
Subjective:    Patient ID: Mary Malone, female    DOB: Jan 27, 1946, 67 y.o.   MRN: [id number]  HPI  patient is here today for annual visit. Patient feels well overall  Diet: heart healthy  Physical activity: sedentary Depression/mood screen: negative Hearing: intact to whispered voice Visual acuity: grossly normal, performs annual eye exam  ADLs: capable Fall risk: none Home safety: good Cognitive evaluation: intact to orientation, naming, recall and repetition EOL planning: adv directives, full code/ I agree  I have personally reviewed and have noted 1. The patient's medical and social history 2. Their use of alcohol, tobacco or illicit drugs 3. Their current medications and supplements 4. The patient's functional ability including ADL's, fall risks, home safety risks and hearing or visual impairment. 5. Diet and physical activities 6. Evidence for depression or mood disorders   Also reviewed chronic medical issues  OAB -urinary incontinence - started vesicare 10/2010 for same symptoms improved, but stopped when out of refills. Also noted constipation side effect and does not wish to resume same - previously urge symptoms>stress related incontinence   no dysuria, no hematuria, not improved with behavioral changes prior to med tx   allg rhinitis- freq pulm and sinus infx related to flares -  Works with Dr Maple Hudson (pulm) for same - uses otc meds to tx same as needed   vit d defic - follows with gyn for same - reports compliance with ongoing medical treatment and no changes in medication dose or frequency. denies adverse side effects related to current therapy.     dyslipidemia - has declined med tx suggested by gyn - understands need for diet and exercise to control same - no ASD complications in FH   Past Medical History  Diagnosis Date  . ALLERGIC RHINITIS   . DEPRESSION   . GERD   . HYPERLIPIDEMIA     diet control efforts  . HYPERTENSION   . MIGRAINE HEADACHE   .  URINARY INCONTINENCE     OAB   Family History  Problem Relation Age of Onset  . Alzheimer's disease Mother   . Breast cancer Mother 42  . Allergies Mother   . Lung cancer Father   . Allergies Brother   . Diabetes Brother 22   History  Substance Use Topics  . Smoking status: Never Smoker   . Smokeless tobacco: Not on file  . Alcohol Use: Yes    Review of Systems  Constitutional: Negative for fever or weight change.  Respiratory: Negative for cough and shortness of breath.   Cardiovascular: Negative for chest pain or palpitations.  Gastrointestinal: Negative for abdominal pain, no bowel changes.  Musculoskeletal: Negative for gait problem or joint swelling.  Skin: Negative for rash.  Neurological: Negative for dizziness or headache.  No other specific complaints in a complete review of systems (except as listed in HPI above).     Objective:   Physical Exam  BP 132/82  Pulse 75  Temp(Src) 97.3 F (36.3 C) (Oral)  Wt 141 lb 1.9 oz (64.012 kg)  BMI 23.48 kg/m2  SpO2 98% Wt Readings from Last 3 Encounters:  01/28/13 141 lb 1.9 oz (64.012 kg)  01/22/13 146 lb (66.225 kg)  12/28/11 156 lb 6.4 oz (70.943 kg)   Constitutional: She appears well-developed and well-nourished. No distress.  HENT: Head: Normocephalic and atraumatic. Ears: B TMs ok, no erythema or effusion; Nose: Nose normal. Mouth/Throat: Oropharynx is clear and moist. No oropharyngeal exudate.  Eyes: Conjunctivae and EOM are  normal. Pupils are equal, round, and reactive to light. No scleral icterus.  Neck: Normal range of motion. Neck supple. No JVD present. No thyromegaly present.  Cardiovascular: Normal rate, regular rhythm and normal heart sounds.  No murmur heard. No BLE edema. Pulmonary/Chest: Effort normal and breath sounds normal. No respiratory distress. She has no wheezes.  Abdominal: Soft. Bowel sounds are normal. She exhibits no distension. There is no tenderness. no masses Musculoskeletal: Normal  range of motion, no joint effusions. No gross deformities Neurological: She is alert and oriented to person, place, and time. No cranial nerve deficit. Coordination normal.  Skin: Skin is warm and dry. No rash noted. No erythema.  Psychiatric: She has a normal mood and affect. Her behavior is normal. Judgment and thought content normal.   Lab Results  Component Value Date   WBC 5.7 11/27/2011   HGB 13.6 11/27/2011   HCT 39.8 11/27/2011   PLT 278.0 11/27/2011   GLUCOSE 97 11/27/2011   CHOL 199 11/27/2011   TRIG 113.0 11/27/2011   HDL 65.50 11/27/2011   LDLCALC 111* 11/27/2011   ALT 16 11/27/2011   AST 16 11/27/2011   NA 140 11/27/2011   K 4.1 11/27/2011   CL 106 11/27/2011   CREATININE 0.7 11/27/2011   BUN 19 11/27/2011   CO2 27 11/27/2011   TSH 4.41 11/27/2011        Assessment & Plan:   CPX/v70.0 - Patient has been counseled on age-appropriate routine health concerns for screening and prevention. These are reviewed and up-to-date. Immunizations are up-to-date or declined. Labs ordered and will be reviewed.  Also See problem list. Medications and labs reviewed today.

## 2013-01-30 ENCOUNTER — Other Ambulatory Visit: Payer: Self-pay | Admitting: Obstetrics and Gynecology

## 2013-01-30 ENCOUNTER — Ambulatory Visit
Admission: RE | Admit: 2013-01-30 | Discharge: 2013-01-30 | Disposition: A | Discharge status: Home / Self Care | Payer: Medicare Other | Source: Ambulatory Visit | Attending: Obstetrics and Gynecology | Admitting: Obstetrics and Gynecology

## 2013-01-30 DIAGNOSIS — R928 Other abnormal and inconclusive findings on diagnostic imaging of breast: Secondary | ICD-10-CM

## 2013-02-02 NOTE — Assessment & Plan Note (Signed)
Only taking acid blocker every other day to compromise bone loss. We looked up Pepcid in [facility], which doesn't list bone loss. She decided that she could alternate Pepcid with Prilosec for better control.

## 2013-02-02 NOTE — Assessment & Plan Note (Signed)
Rhinitis with eustachian dysfunction. This may be partly related to her reflux. Medications discussed.

## 2013-03-30 ENCOUNTER — Ambulatory Visit: Payer: Self-pay | Admitting: Obstetrics and Gynecology

## 2013-04-06 ENCOUNTER — Ambulatory Visit: Payer: Self-pay | Admitting: Obstetrics and Gynecology

## 2013-04-08 ENCOUNTER — Ambulatory Visit (INDEPENDENT_AMBULATORY_CARE_PROVIDER_SITE_OTHER): Payer: Medicare Other | Admitting: Obstetrics and Gynecology

## 2013-04-08 ENCOUNTER — Encounter: Payer: Self-pay | Admitting: Obstetrics and Gynecology

## 2013-04-08 VITALS — BP 138/60 | HR 74 | Resp 16 | Ht 65.25 in | Wt 143.0 lb

## 2013-04-08 DIAGNOSIS — Z01419 Encounter for gynecological examination (general) (routine) without abnormal findings: Secondary | ICD-10-CM

## 2013-04-08 MED ORDER — PROGESTERONE MICRONIZED 100 MG PO CAPS: 100.0000 mg | ORAL_CAPSULE | Freq: Every day | ORAL | Status: DC

## 2013-04-08 MED ORDER — ESTROGENS CONJUGATED 0.3 MG PO TABS: 0.3000 mg | ORAL_TABLET | Freq: Every day | ORAL | Status: DC

## 2013-04-08 NOTE — Progress Notes (Addendum)
67 y.o.   Married    Caucasian   female   [id number]   here for annual exam.  Had cataract surgery this year and still has some problem with glare.  Still taking the prempro 0.3/1.5 mg.  Pt has FH of breast cancer in her mother, has never had children, and has dense breasts.  On the other hand, her other also had Altzhiemer's and pt understands that it doesn't protect ag altzheimers but may improve synapses in theory.  She understands there is no hard data to prove which way is better, and the pt elects to continue it.  Also to help her bone health.  Myrbetriq helps some but she wouldn't mind a higher dose.  She had signif constipation on vesicare.    No LMP recorded. Patient is postmenopausal.          Sexually active: no  The current method of family planning is abstinence and post menopausal status.    Exercising: no Last mammogram:  03/2013 need additional imaging, f/u due 07/2013 Last pap smear:02/14/10 neg History of abnormal pap: ? HPV 2002 Smoking: never Alcohol: occ glass of wine Last colonoscopy: 2005 normal, repeat in 10 years Last Bone Density:  01/2010 osteopenia Last tetanus shot:03/2013 Last cholesterol check: 03/2013 slightly elevated  Hgb: pcp               Urine: pcp   Family History  Problem Relation Age of Onset  . Alzheimer's disease Mother   . Allergies Mother   . Breast cancer Mother 15  . Lung cancer Father   . Allergies Brother   . Diabetes Brother 43    Patient Active Problem List   Diagnosis Date Noted  . HYPERLIPIDEMIA 11/01/2010  . DEPRESSION 11/01/2010  . MIGRAINE HEADACHE 11/01/2010  . HYPERTENSION 11/01/2010  . OAB (overactive bladder)   . GERD 09/23/2007  . Perennial allergic rhinitis with seasonal variation 09/22/2007    Past Medical History  Diagnosis Date  . ALLERGIC RHINITIS   . DEPRESSION   . GERD   . HYPERLIPIDEMIA     diet control efforts  . HYPERTENSION   . MIGRAINE HEADACHE   . URINARY INCONTINENCE     OAB  . Dense breast   .  Fibroid   . PMB (postmenopausal bleeding) 12/2001    polyps  . Osteopenia 02/2006    Past Surgical History  Procedure Laterality Date  . Left knee surgey    . Dilation and curettage of uterus  2003  . Cataract extraction Bilateral 07/2012, 09/2011  . Wisdom tooth extraction    . Hysteroscopy  01/2002    resection, sm fibroid    Allergies: Review of patient's allergies indicates no known allergies.  Current Outpatient Prescriptions  Medication Sig Dispense Refill  . azelastine (ASTEPRO) 137 MCG/SPRAY nasal spray Place 1 spray into the nose at bedtime.  90 mL  3  . BIOTIN PO Take 1,000 mcg by mouth.      . Calcium Citrate-Vitamin D (CITRACAL + D PO) Take 1,200 mg by mouth daily.      . Coenzyme Q10 (COQ10) 100 MG CAPS Take 1 capsule by mouth daily.      . folic acid (FOLVITE) 400 MCG tablet Take 400 mcg by mouth daily.      . mirabegron ER (MYRBETRIQ) 25 MG TB24 Take 1 tablet (25 mg total) by mouth daily.  30 tablet  5  . mometasone (NASONEX) 50 MCG/ACT nasal spray Place 1 spray into the  nose at bedtime.  51 g  3  . Multiple Vitamins-Minerals (CENTRUM SILVER) tablet Take 1 tablet by mouth daily.      . Omega-3 Fatty Acids (FISH OIL) 1200 MG CAPS Take by mouth daily.      Marland Kitchen omeprazole (PRILOSEC OTC) 20 MG tablet Take 20 mg by mouth as needed.      Marland Kitchen PREMPRO 0.3-1.5 MG per tablet Take 1 by mouth daily      . Psyllium (METAMUCIL PO) Take by mouth as needed.      . Vitamin D, Ergocalciferol, (DRISDOL) 50000 UNITS CAPS Take 1 by mouth every other week       No current facility-administered medications for this visit.    ROS: Pertinent items are noted in HPI.  Social Hx:  Married, no children, husband has prostate cancer being tx'd conservatively, pt retired from being an Environmental manager  Exam:    BP 138/60  Pulse 74  Resp 16  Ht 5' 5.25" (1.657 m)  Wt 143 lb (64.864 kg)  BMI 23.62 kg/m2ht stable, wt down 10 pounds from last year   Wt Readings from Last 3 Encounters:   04/08/13 143 lb (64.864 kg)  01/28/13 141 lb 1.9 oz (64.012 kg)  01/22/13 146 lb (66.225 kg)     Ht Readings from Last 3 Encounters:  04/08/13 5' 5.25" (1.657 m)  01/22/13 5\' 5"  (1.651 m)  12/28/11 5\' 6"  (1.676 m)    General appearance: alert, cooperative and appears stated age Head: Normocephalic, without obvious abnormality, atraumatic Neck: no adenopathy, supple, symmetrical, trachea midline and thyroid not enlarged, symmetric, no tenderness/mass/nodules Lungs: clear to auscultation bilaterally Breasts: Inspection negative, No nipple retraction or dimpling, No nipple discharge or bleeding, No axillary or supraclavicular adenopathy, Normal to palpation without dominant masses Heart: regular rate and rhythm Abdomen: soft, non-tender; bowel sounds normal; no masses,  no organomegaly Extremities: extremities normal, atraumatic, no cyanosis or edema Skin: Skin color, texture, turgor normal. No rashes or lesions Lymph nodes: Cervical, supraclavicular, and axillary nodes normal. No abnormal inguinal nodes palpated Neurologic: Grossly normal   Pelvic: External genitalia:  no lesions              Urethra:  normal appearing urethra with no masses, tenderness or lesions              Bartholins and Skenes: normal                 Vagina: normal appearing vagina with normal color and discharge, no lesions              Cervix: normal appearance              Pap taken: no        Bimanual Exam:  Uterus:  uterus is normal size, shape, consistency and nontender                                      Adnexa: normal adnexa in size, nontender and no masses                                      Rectovaginal: Confirms  Anus:  normal sphincter tone, no lesions  A: normal menopausal exam, on Prempro     OAB, minimally satisfactory response to 25 mg Myrbetriq and intolerant (constipation) with anticholinergics     P:     mammogram counseled on breast self exam,  mammography screening, adequate intake of calcium and vitamin D, diet and exercise return annually or prn   Samples given of Myrbetriq 50 mg for one month, and if it helps, she will call here PCP who originally rx'd it for a new rx Discussed risks of breast cancer on MPA vs P4 and pt elects to change to P4.  Therefore, her HRT will change to Prem 0.3 po qd and P4 100 mg po qd.     An After Visit Summary was printed and given to the patient.

## 2013-04-08 NOTE — Patient Instructions (Addendum)

## 2013-04-27 ENCOUNTER — Telehealth: Payer: Self-pay | Admitting: *Deleted

## 2013-04-27 MED ORDER — MIRABEGRON ER 50 MG PO TB24: 50.0000 mg | ORAL_TABLET | Freq: Every day | ORAL | Status: DC

## 2013-04-27 NOTE — Telephone Encounter (Signed)
Increase to 50mg  daily - new erx for 50mg  tabs done

## 2013-04-27 NOTE — Telephone Encounter (Signed)
Pt called requesting a stronger dose of Myrbetriq.  Please advise

## 2013-04-28 NOTE — Telephone Encounter (Signed)
Spoke with pt advised of Rx,

## 2013-06-29 ENCOUNTER — Encounter: Payer: Self-pay | Admitting: Internal Medicine

## 2013-06-29 ENCOUNTER — Other Ambulatory Visit: Payer: Self-pay | Admitting: Internal Medicine

## 2013-06-29 DIAGNOSIS — N6489 Other specified disorders of breast: Secondary | ICD-10-CM

## 2013-06-29 DIAGNOSIS — R921 Mammographic calcification found on diagnostic imaging of breast: Secondary | ICD-10-CM

## 2013-06-29 DIAGNOSIS — N63 Unspecified lump in unspecified breast: Secondary | ICD-10-CM

## 2013-08-03 ENCOUNTER — Ambulatory Visit
Admission: RE | Admit: 2013-08-03 | Discharge: 2013-08-03 | Disposition: A | Discharge status: Home / Self Care | Payer: Self-pay | Source: Ambulatory Visit | Attending: Internal Medicine | Admitting: Internal Medicine

## 2013-08-03 DIAGNOSIS — R921 Mammographic calcification found on diagnostic imaging of breast: Secondary | ICD-10-CM

## 2013-08-03 DIAGNOSIS — N63 Unspecified lump in unspecified breast: Secondary | ICD-10-CM

## 2013-08-03 DIAGNOSIS — N6489 Other specified disorders of breast: Secondary | ICD-10-CM

## 2013-08-14 ENCOUNTER — Telehealth: Payer: Self-pay | Admitting: Emergency Medicine

## 2013-08-14 NOTE — Telephone Encounter (Signed)
Pharmacist calling from [facility]. State they are in receipt of the form faxed yesterday for prior authorization for Premarin. Would like icd-9 code for diagnosis:   Advised: Post menopausal treatment HRT and osteopenia. Approval code received from 08/13/2013-08/13/2014.

## 2013-10-19 ENCOUNTER — Other Ambulatory Visit: Payer: Self-pay | Admitting: Internal Medicine

## 2013-11-23 ENCOUNTER — Other Ambulatory Visit: Payer: Self-pay | Admitting: Internal Medicine

## 2013-11-23 DIAGNOSIS — N63 Unspecified lump in unspecified breast: Secondary | ICD-10-CM

## 2014-01-22 ENCOUNTER — Ambulatory Visit: Payer: Medicare Other | Admitting: Internal Medicine

## 2014-02-01 ENCOUNTER — Ambulatory Visit
Admission: RE | Admit: 2014-02-01 | Discharge: 2014-02-01 | Disposition: A | Discharge status: Home / Self Care | Payer: Medicare Other | Source: Ambulatory Visit | Attending: Internal Medicine | Admitting: Internal Medicine

## 2014-02-01 DIAGNOSIS — N63 Unspecified lump in unspecified breast: Secondary | ICD-10-CM

## 2014-02-03 ENCOUNTER — Other Ambulatory Visit (INDEPENDENT_AMBULATORY_CARE_PROVIDER_SITE_OTHER): Payer: Medicare Other

## 2014-02-03 ENCOUNTER — Encounter: Payer: Self-pay | Admitting: Internal Medicine

## 2014-02-03 ENCOUNTER — Ambulatory Visit (INDEPENDENT_AMBULATORY_CARE_PROVIDER_SITE_OTHER): Payer: Medicare Other | Admitting: Internal Medicine

## 2014-02-03 ENCOUNTER — Encounter: Payer: Medicare Other | Admitting: Internal Medicine

## 2014-02-03 VITALS — BP 132/80 | HR 69 | Temp 98.2°F | Wt 144.0 lb

## 2014-02-03 DIAGNOSIS — Z0000 Encounter for general adult medical examination without abnormal findings: Secondary | ICD-10-CM

## 2014-02-03 DIAGNOSIS — I10 Essential (primary) hypertension: Secondary | ICD-10-CM

## 2014-02-03 DIAGNOSIS — E785 Hyperlipidemia, unspecified: Secondary | ICD-10-CM

## 2014-02-03 LAB — CBC WITH DIFFERENTIAL/PLATELET
Basophils Absolute: 0.1 10*3/uL (ref 0.0–0.1)
Basophils Relative: 1.1 % (ref 0.0–3.0)
Eosinophils Absolute: 0.2 10*3/uL (ref 0.0–0.7)
Eosinophils Relative: 2.7 % (ref 0.0–5.0)
HCT: 39.7 % (ref 36.0–46.0)
Hemoglobin: 13.7 g/dL (ref 12.0–15.0)
Lymphocytes Relative: 32.3 % (ref 12.0–46.0)
Lymphs Abs: 2.1 10*3/uL (ref 0.7–4.0)
MCHC: 34.4 g/dL (ref 30.0–36.0)
MCV: 91.7 fl (ref 78.0–100.0)
Monocytes Absolute: 0.5 10*3/uL (ref 0.1–1.0)
Monocytes Relative: 7.1 % (ref 3.0–12.0)
Neutro Abs: 3.6 10*3/uL (ref 1.4–7.7)
Neutrophils Relative %: 56.8 % (ref 43.0–77.0)
Platelets: 256 10*3/uL (ref 150.0–400.0)
RBC: 4.34 Mil/uL (ref 3.87–5.11)
RDW: 12.7 % (ref 11.5–15.5)
WBC: 6.4 10*3/uL (ref 4.0–10.5)

## 2014-02-03 LAB — URINALYSIS, ROUTINE W REFLEX MICROSCOPIC
Bilirubin Urine: NEGATIVE
Hgb urine dipstick: NEGATIVE
Leukocytes, UA: NEGATIVE
Nitrite: NEGATIVE
Specific Gravity, Urine: 1.025
Total Protein, Urine: NEGATIVE
Urine Glucose: NEGATIVE
Urobilinogen, UA: 0.2
pH: 6 (ref 5.0–8.0)

## 2014-02-03 LAB — TSH: TSH: 3.03 u[IU]/mL (ref 0.35–4.50)

## 2014-02-03 LAB — LIPID PANEL
Cholesterol: 233 mg/dL — ABNORMAL HIGH (ref 0–200)
HDL: 87.1 mg/dL
LDL Cholesterol: 131 mg/dL — ABNORMAL HIGH (ref 0–99)
NonHDL: 145.9
Total CHOL/HDL Ratio: 3
Triglycerides: 75 mg/dL (ref 0.0–149.0)
VLDL: 15 mg/dL (ref 0.0–40.0)

## 2014-02-03 LAB — BASIC METABOLIC PANEL
BUN: 17 mg/dL (ref 6–23)
CO2: 27 mEq/L (ref 19–32)
Calcium: 9.8 mg/dL (ref 8.4–10.5)
Chloride: 105 mEq/L (ref 96–112)
Creatinine, Ser: 0.8 mg/dL (ref 0.4–1.2)
GFR: 71.89 mL/min (ref >60.00)
Glucose, Bld: 92 mg/dL (ref 70–99)
Potassium: 4.1 mEq/L (ref 3.5–5.1)
Sodium: 140 mEq/L (ref 135–145)

## 2014-02-03 LAB — HEPATIC FUNCTION PANEL
ALT: 19 U/L (ref 0–35)
AST: 23 U/L (ref 0–37)
Albumin: 4.1 g/dL (ref 3.5–5.2)
Alkaline Phosphatase: 45 U/L (ref 39–117)
Bilirubin, Direct: 0.1 mg/dL (ref 0.0–0.3)
Total Bilirubin: 0.9 mg/dL (ref 0.2–1.2)
Total Protein: 7.2 g/dL (ref 6.0–8.3)

## 2014-02-03 MED ORDER — ZOSTER VACCINE LIVE 19400 UNT/0.65ML ~~LOC~~ SOLR
0.6500 mL | Freq: Once | SUBCUTANEOUS | Status: DC
Start: 2014-02-03 — End: 2014-04-09

## 2014-02-03 NOTE — Progress Notes (Signed)
Subjective:    Patient ID: Mary Malone, female    DOB: 1946-07-03, 68 y.o.   MRN: [id number]  HPI  patient is here today for annual physical. Patient feels well overall  Also  Here for medicare wellness  Diet: heart healthy or DM if diabetic Physical activity: sedentary Depression/mood screen: negative Hearing: intact to whispered voice Visual acuity: grossly normal, performs annual eye exam  ADLs: capable Fall risk: none Home safety: good Cognitive evaluation: intact to orientation, naming, recall and repetition EOL planning: adv directives, full code/ I agree  I have personally reviewed and have noted 1. The patient's medical and social history 2. Their use of alcohol, tobacco or illicit drugs 3. Their current medications and supplements 4. The patient's functional ability including ADL's, fall risks, home safety risks and hearing or visual impairment. 5. Diet and physical activities 6. Evidence for depression or mood disorders  Also reviewed chronic medical issues and interval medical events  Past Medical History  Diagnosis Date  . ALLERGIC RHINITIS   . DEPRESSION   . GERD   . HYPERLIPIDEMIA     diet control efforts  . HYPERTENSION   . MIGRAINE HEADACHE   . URINARY INCONTINENCE     OAB  . Dense breast   . Fibroid   . PMB (postmenopausal bleeding) 12/2001    polyps  . Osteopenia 02/2006   Family History  Problem Relation Age of Onset  . Alzheimer's disease Mother   . Allergies Mother   . Breast cancer Mother 53  . Lung cancer Father 42  . Allergies Brother   . Diabetes Brother 15  . Alzheimer's disease Brother 30    early onset  . Transient ischemic attack Maternal Grandmother   . Peripheral Artery Disease Maternal Grandmother     s/p B leg amputation  . Sudden death Paternal Grandmother 31    unknown   History  Substance Use Topics  . Smoking status: Never Smoker   . Smokeless tobacco: Never Used  . Alcohol Use: 0.5 oz/week    1 drink(s) per  week     Comment: occ glass of wine, <4x/year    Review of Systems  Constitutional: Negative for fatigue and unexpected weight change.  Respiratory: Negative for cough, shortness of breath and wheezing.   Cardiovascular: Negative for chest pain, palpitations and leg swelling.  Gastrointestinal: Negative for nausea, abdominal pain and diarrhea.  Neurological: Negative for dizziness, weakness, light-headedness and headaches.  Psychiatric/Behavioral: Negative for dysphoric mood. The patient is not nervous/anxious.   All other systems reviewed and are negative.      Objective:   Physical Exam  BP 132/80  Pulse 69  Temp(Src) 98.2 F (36.8 C) (Oral)  Wt 144 lb (65.318 kg)  SpO2 97% Wt Readings from Last 3 Encounters:  02/03/14 144 lb (65.318 kg)  04/08/13 143 lb (64.864 kg)  01/28/13 141 lb 1.9 oz (64.012 kg)   Constitutional: She appears well-developed and well-nourished. No distress.  HENT: Head: Normocephalic and atraumatic. Ears: B TMs ok, no erythema or effusion; Nose: Nose normal. Mouth/Throat: Oropharynx is clear and moist. No oropharyngeal exudate.  Eyes: Conjunctivae and EOM are normal. Pupils are equal, round, and reactive to light. No scleral icterus.  Neck: Normal range of motion. Neck supple. No JVD present. No thyromegaly present.  Cardiovascular: Normal rate, regular rhythm and normal heart sounds.  No murmur heard. No BLE edema. Pulmonary/Chest: Effort normal and breath sounds normal. No respiratory distress. She has no wheezes.  Abdominal: Soft. Bowel sounds are normal. She exhibits no distension. There is no tenderness. no masses Musculoskeletal: Normal range of motion, no joint effusions. No gross deformities Neurological: She is alert and oriented to person, place, and time. No cranial nerve deficit. Coordination, balance, strength, speech and gait are normal.  Skin: Skin is warm and dry. No rash noted. No erythema.  Psychiatric: She has a normal mood and affect.  Her behavior is normal. Judgment and thought content normal.   Lab Results  Component Value Date   WBC 7.2 01/28/2013   HGB 14.5 01/28/2013   HCT 41.7 01/28/2013   PLT 269.0 01/28/2013   GLUCOSE 87 01/28/2013   CHOL 224* 01/28/2013   TRIG 74.0 01/28/2013   HDL 79.60 01/28/2013   LDLDIRECT 132.3 01/28/2013   LDLCALC 111* 11/27/2011   ALT 19 01/28/2013   AST 21 01/28/2013   NA 140 01/28/2013   K 4.1 01/28/2013   CL 106 01/28/2013   CREATININE 0.8 01/28/2013   BUN 15 01/28/2013   CO2 28 01/28/2013   TSH 3.13 01/28/2013    Mm Digital Diagnostic Bilat  02/01/2014   CLINICAL DATA:  Short-term interval followup of probable benign bilateral breast calcifications.  EXAM: DIGITAL DIAGNOSTIC  BILATERAL MAMMOGRAM WITH CAD  COMPARISON:  With priors.  ACR Breast Density Category c: The breast tissue is heterogeneously dense, which may obscure small masses.  FINDINGS: Magnification views of each breast were obtained. The calcifications in each breast are stable. Some of the calcifications have a differential appearance and are thought to be milk of calcium. There is no suspicious mass or malignant type microcalcifications in either breast.  Mammographic images were processed with CAD.  IMPRESSION: No evidence of malignancy in either breast.  RECOMMENDATION: Bilateral screening mammogram in 1 year is recommended.  I have discussed the findings and recommendations with the patient. Results were also provided in writing at the conclusion of the visit. If applicable, a reminder letter will be sent to the patient regarding the next appointment.  BI-RADS CATEGORY  2: Benign Finding(s)   Electronically Signed   By: Lillia Mountain M.D.   On: 02/01/2014 15:00       Assessment & Plan:   CPX/AWV/v70.0 - Today patient counseled on age appropriate routine health concerns for screening and prevention, each reviewed and up to date or declined. Immunizations reviewed and up to date or declined. Labs ordered and reviewed. Risk factors for  depression reviewed and negative. Hearing function and visual acuity are intact. ADLs screened and addressed as needed. Functional ability and level of safety reviewed and appropriate. Education, counseling and referrals performed based on assessed risks today. Patient provided with a copy of personalized plan for preventive services.

## 2014-02-03 NOTE — Progress Notes (Signed)
Pre visit review using our clinic review tool, if applicable. No additional management support is needed unless otherwise documented below in the visit note. 

## 2014-02-03 NOTE — Patient Instructions (Addendum)
It was good to see you today.  We have reviewed your prior records including labs and tests today  Health Maintenance reviewed - all recommended immunizations and age-appropriate screenings are up-to-date.  Test(s) ordered today. Your results will be released to [place] (or called to you) after review, usually within 72hours after test completion. If any changes need to be made, you will be notified at that same time.  Medications reviewed and updated, no changes recommended at this time. Refill on medication(s) as discussed today.  Please schedule followup in 12 months for annual exam and labs, call sooner if problems.  Health Maintenance, Female A healthy lifestyle and preventative care can promote health and wellness.  Maintain regular health, dental, and eye exams.  Eat a healthy diet. Foods like vegetables, fruits, whole grains, low-fat dairy products, and lean protein foods contain the nutrients you need without too many calories. Decrease your intake of foods high in solid fats, added sugars, and salt. Get information about a proper diet from your caregiver, if necessary.  Regular physical exercise is one of the most important things you can do for your health. Most adults should get at least 150 minutes of moderate-intensity exercise (any activity that increases your heart rate and causes you to sweat) each week. In addition, most adults need muscle-strengthening exercises on 2 or more days a week.   Maintain a healthy weight. The body mass index (BMI) is a screening tool to identify possible weight problems. It provides an estimate of body fat based on height and weight. Your caregiver can help determine your BMI, and can help you achieve or maintain a healthy weight. For adults 20 years and older:  A BMI below 18.5 is considered underweight.  A BMI of 18.5 to 24.9 is normal.  A BMI of 25 to 29.9 is considered overweight.  A BMI of 30 and above is considered obese.  Maintain  normal blood lipids and cholesterol by exercising and minimizing your intake of saturated fat. Eat a balanced diet with plenty of fruits and vegetables. Blood tests for lipids and cholesterol should begin at age 49 and be repeated every 5 years. If your lipid or cholesterol levels are high, you are over 50, or you are a high risk for heart disease, you may need your cholesterol levels checked more frequently.Ongoing high lipid and cholesterol levels should be treated with medicines if diet and exercise are not effective.  If you smoke, find out from your caregiver how to quit. If you do not use tobacco, do not start.  Lung cancer screening is recommended for adults aged 48 80 years who are at high risk for developing lung cancer because of a history of smoking. Yearly low-dose computed tomography (CT) is recommended for people who have at least a 30-pack-year history of smoking and are a current smoker or have quit within the past 15 years. A pack year of smoking is smoking an average of 1 pack of cigarettes a day for 1 year (for example: 1 pack a day for 30 years or 2 packs a day for 15 years). Yearly screening should continue until the smoker has stopped smoking for at least 15 years. Yearly screening should also be stopped for people who develop a health problem that would prevent them from having lung cancer treatment.  If you are pregnant, do not drink alcohol. If you are breastfeeding, be very cautious about drinking alcohol. If you are not pregnant and choose to drink alcohol, do not exceed  1 drink per day. One drink is considered to be 12 ounces (355 mL) of beer, 5 ounces (148 mL) of wine, or 1.5 ounces (44 mL) of liquor.  Avoid use of street drugs. Do not share needles with anyone. Ask for help if you need support or instructions about stopping the use of drugs.  High blood pressure causes heart disease and increases the risk of stroke. Blood pressure should be checked at least every 1 to 2  years. Ongoing high blood pressure should be treated with medicines, if weight loss and exercise are not effective.  If you are 24 to 67 years old, ask your caregiver if you should take aspirin to prevent strokes.  Diabetes screening involves taking a blood sample to check your fasting blood sugar level. This should be done once every 3 years, after age 5, if you are within normal weight and without risk factors for diabetes. Testing should be considered at a younger age or be carried out more frequently if you are overweight and have at least 1 risk factor for diabetes.  Breast cancer screening is essential preventative care for women. You should practice "breast self-awareness." This means understanding the normal appearance and feel of your breasts and may include breast self-examination. Any changes detected, no matter how small, should be reported to a caregiver. Women in their 1s and 30s should have a clinical breast exam (CBE) by a caregiver as part of a regular health exam every 1 to 3 years. After age 68, women should have a CBE every year. Starting at age 41, women should consider having a mammogram (breast X-ray) every year. Women who have a family history of breast cancer should talk to their caregiver about genetic screening. Women at a high risk of breast cancer should talk to their caregiver about having an MRI and a mammogram every year.  Breast cancer gene (BRCA)-related cancer risk assessment is recommended for women who have family members with BRCA-related cancers. BRCA-related cancers include breast, ovarian, tubal, and peritoneal cancers. Having family members with these cancers may be associated with an increased risk for harmful changes (mutations) in the breast cancer genes BRCA1 and BRCA2. Results of the assessment will determine the need for genetic counseling and BRCA1 and BRCA2 testing.  The Pap test is a screening test for cervical cancer. Women should have a Pap test  starting at age 21. Between ages 72 and 73, Pap tests should be repeated every 2 years. Beginning at age 66, you should have a Pap test every 3 years as long as the past 3 Pap tests have been normal. If you had a hysterectomy for a problem that was not cancer or a condition that could lead to cancer, then you no longer need Pap tests. If you are between ages 52 and 57, and you have had normal Pap tests going back 10 years, you no longer need Pap tests. If you have had past treatment for cervical cancer or a condition that could lead to cancer, you need Pap tests and screening for cancer for at least 20 years after your treatment. If Pap tests have been discontinued, risk factors (such as a new sexual partner) need to be reassessed to determine if screening should be resumed. Some women have medical problems that increase the chance of getting cervical cancer. In these cases, your caregiver may recommend more frequent screening and Pap tests.  The human papillomavirus (HPV) test is an additional test that may be used for cervical cancer  screening. The HPV test looks for the virus that can cause the cell changes on the cervix. The cells collected during the Pap test can be tested for HPV. The HPV test could be used to screen women aged 46 years and older, and should be used in women of any age who have unclear Pap test results. After the age of 10, women should have HPV testing at the same frequency as a Pap test.  Colorectal cancer can be detected and often prevented. Most routine colorectal cancer screening begins at the age of 55 and continues through age 47. However, your caregiver may recommend screening at an earlier age if you have risk factors for colon cancer. On a yearly basis, your caregiver may provide home test kits to check for hidden blood in the stool. Use of a small camera at the end of a tube, to directly examine the colon (sigmoidoscopy or colonoscopy), can detect the earliest forms of  colorectal cancer. Talk to your caregiver about this at age 42, when routine screening begins. Direct examination of the colon should be repeated every 5 to 10 years through age 72, unless early forms of pre-cancerous polyps or small growths are found.  Hepatitis C blood testing is recommended for all people born from [birth year] through 1964 and any individual with known risks for hepatitis C.  Practice safe sex. Use condoms and avoid high-risk sexual practices to reduce the spread of sexually transmitted infections (STIs). Sexually active women aged 30 and younger should be checked for Chlamydia, which is a common sexually transmitted infection. Older women with new or multiple partners should also be tested for Chlamydia. Testing for other STIs is recommended if you are sexually active and at increased risk.  Osteoporosis is a disease in which the bones lose minerals and strength with aging. This can result in serious bone fractures. The risk of osteoporosis can be identified using a bone density scan. Women ages 11 and over and women at risk for fractures or osteoporosis should discuss screening with their caregivers. Ask your caregiver whether you should be taking a calcium supplement or vitamin D to reduce the rate of osteoporosis.  Menopause can be associated with physical symptoms and risks. Hormone replacement therapy is available to decrease symptoms and risks. You should talk to your caregiver about whether hormone replacement therapy is right for you.  Use sunscreen. Apply sunscreen liberally and repeatedly throughout the day. You should seek shade when your shadow is shorter than you. Protect yourself by wearing long sleeves, pants, a wide-brimmed hat, and sunglasses year round, whenever you are outdoors.  Notify your caregiver of new moles or changes in moles, especially if there is a change in shape or color. Also notify your caregiver if a mole is larger than the size of a pencil eraser.  Stay  current with your immunizations. Document Released: 03/05/2011 Document Revised: 12/15/2012 Document Reviewed: 03/05/2011 [facility] Patient Information 2014 [employer].

## 2014-02-15 ENCOUNTER — Ambulatory Visit (INDEPENDENT_AMBULATORY_CARE_PROVIDER_SITE_OTHER): Payer: Medicare Other | Admitting: Internal Medicine

## 2014-02-15 ENCOUNTER — Encounter: Payer: Self-pay | Admitting: Internal Medicine

## 2014-02-15 VITALS — BP 118/78 | HR 72 | Ht 65.0 in | Wt 146.0 lb

## 2014-02-15 DIAGNOSIS — J3089 Other allergic rhinitis: Secondary | ICD-10-CM

## 2014-02-15 DIAGNOSIS — J309 Allergic rhinitis, unspecified: Secondary | ICD-10-CM

## 2014-02-15 DIAGNOSIS — K219 Gastro-esophageal reflux disease without esophagitis: Secondary | ICD-10-CM

## 2014-02-15 DIAGNOSIS — J302 Other seasonal allergic rhinitis: Secondary | ICD-10-CM

## 2014-02-15 MED ORDER — IPRATROPIUM BROMIDE 0.06 % NA SOLN: NASAL | Status: DC

## 2014-02-15 MED ORDER — MONTELUKAST SODIUM 10 MG PO TABS
10.0000 mg | ORAL_TABLET | Freq: Every day | ORAL | Status: DC
Start: 2014-02-15 — End: 2015-01-07

## 2014-02-15 MED ORDER — MOMETASONE FUROATE 50 MCG/ACT NA SUSP: 1.0000 | Freq: Every day | NASAL | Status: DC

## 2014-02-15 MED ORDER — AZELASTINE HCL 0.1 % NA SOLN: 2.0000 | Freq: Two times a day (BID) | NASAL | Status: DC

## 2014-02-15 NOTE — Progress Notes (Signed)
12/28/11- 71 yoF never smoker (widow- husb died of lung Ca), followed for allergic rhinitis, complicated by GERD. And nasal congestion, perennial, worse lying down. Sometimes makes her get up. Fluid feeling in the ears without popping. Ears itch. Pollen is making her sneeze and cough without wheeze. Postnasal drip varies. Denies headache, purulent discharge or fever. Taking occasional Claritin-D and Nasonex does help.  01/22/13- 43 yoF never smoker (widow- husb died of lung Ca), followed for allergic rhinitis, complicated by GERD. FOLLOWS FOR: throat feels fuller than usual when outside(feels like a pollen collection) and having dizzy spells from ears (both). Almost feels like there is fluid in the ears. REFILLS for 90 day supply for Nasonex and Astepro sent to pharmacy on file. Pollen season only caused throat irritation this year. Some pressure in her ears made her feel unsteady. Today she feels well. Likes Nasonex. Denies cough. Aware of chronic GERD. Takes Prilosec every other day as a compromise to minimize bone loss.  02/15/14- 66 yoF never smoker (widow- husb died of lung Ca), followed for allergic rhinitis, complicated by GERD. FOLLOWS FOR: PND-white in color-happens every day and getting stuck in her throat. Using Nasonex and asked her pro at night. Alternates Pepcid with Prilosec but does not feel reflux.  ROS-see HPI Constitutional:   No-   weight loss, night sweats, fevers, chills, fatigue, lassitude. HEENT:   No-  headaches, difficulty swallowing, tooth/dental problems, sore throat,       + sneezing, itching, +ear ache, nasal congestion, +post nasal drip,  CV:  No-   chest pain, orthopnea, PND, swelling in lower extremities, anasarca,  dizziness, palpitations Resp: No-   shortness of breath with exertion or at rest.              No-   productive cough,  + non-productive cough,  No- coughing up of blood.              No-   change in color of mucus.  No- wheezing.   Skin: No-   rash or  lesions. GI:  +  heartburn, indigestion,no- abdominal pain, nausea, vomiting,  GU:  MS:  No-   joint pain or swelling.   Neuro-     nothing unusual Psych:  No- change in mood or affect. No depression or anxiety.  No memory loss.  OBJ- Physical Exam General- Alert, Oriented, Affect-appropriate, Distress- none acute, trim Skin- rash-none, lesions- none, excoriation- none Lymphadenopathy- none Head- atraumatic            Eyes- Gross vision intact, PERRLA, conjunctivae and secretions clear            Ears- Hearing, canals-normal            Nose- turbinate edema, no-Septal dev, mucus, polyps, erosion, perforation             Throat- Mallampati II , mucosa +red , drainage- none, tonsils- atrophic Neck- flexible , trachea midline, no stridor , thyroid nl, carotid no bruit Chest - symmetrical excursion , unlabored           Heart/CV- RRR , no murmur , no gallop  , no rub, nl s1 s2                           - JVD- none , edema- none, stasis changes- none, varices- none           Lung- clear to P&A, wheeze- none, cough- none , dullness-none,  rub- none           Chest wall-  Abd-  Br/ Gen/ Rectal- Not done, not indicated Extrem- cyanosis- none, clubbing, none, atrophy- none, strength- nl Neuro- grossly intact to observation

## 2014-02-15 NOTE — Patient Instructions (Addendum)
Try otc Sudafed (pharmacy counter to sign) or Sudafed-PE occasionally as needed as a decongestant. Should help stuffy ears.  Script for ipratropium nasal spray. 1-2 puffs each nostril, 0-4 x/ daily if needed to reduce drainage. You can use this together with, or instead of, your other nasal sprays.  Script for Singulair/ montelukast airway anti-inflammatory. Try 1 daily while needed.  Ok to use the Neti pot saline nasal rinse whenever needed.

## 2014-04-09 ENCOUNTER — Encounter: Payer: Self-pay | Admitting: Obstetrics and Gynecology

## 2014-04-09 ENCOUNTER — Ambulatory Visit (INDEPENDENT_AMBULATORY_CARE_PROVIDER_SITE_OTHER): Payer: Medicare Other | Admitting: Obstetrics and Gynecology

## 2014-04-09 VITALS — BP 124/80 | HR 70 | Resp 14 | Ht 65.0 in | Wt 145.2 lb

## 2014-04-09 DIAGNOSIS — M8580 Other specified disorders of bone density and structure, unspecified site: Secondary | ICD-10-CM

## 2014-04-09 DIAGNOSIS — M949 Disorder of cartilage, unspecified: Secondary | ICD-10-CM

## 2014-04-09 DIAGNOSIS — M899 Disorder of bone, unspecified: Secondary | ICD-10-CM

## 2014-04-09 DIAGNOSIS — Z01419 Encounter for gynecological examination (general) (routine) without abnormal findings: Secondary | ICD-10-CM

## 2014-04-09 MED ORDER — ESTROGENS CONJUGATED 0.3 MG PO TABS: 0.3000 mg | ORAL_TABLET | Freq: Every day | ORAL | Status: DC

## 2014-04-09 MED ORDER — PROGESTERONE MICRONIZED 100 MG PO CAPS: 100.0000 mg | ORAL_CAPSULE | Freq: Every day | ORAL | Status: DC

## 2014-04-09 NOTE — Progress Notes (Signed)
GYNECOLOGY VISIT  PCP: Dr. Carma Lair  Referring provider:   HPI: 68 y.o.   Married  Caucasian  female   [id number] with Patient's last menstrual period was 09/03/1996.   here for   Annual.  Patient on HRT. Started on them for hot flashes.  No bleeding or spotting.  Wants to continue.   Some urinary urgency.  PCP prescribing Myrbetriq.  Wants pap smear today.   Hgb:  PCP Urine:  PCP  GYNECOLOGIC HISTORY: Patient's last menstrual period was 09/03/1996. Sexually active:  no Partner preference:female  Contraception:   Post-menopausal Menopausal hormone therapy: permarin 0.3mg  progesterone 100mg  DES exposure:   no Blood transfusions:no    Sexually transmitted diseases:   no GYN procedures and prior surgeries:  D&C-Dr. Smith 2003 Last mammogram: 02/01/14 BI-RADS Benign finding (calcifications)                Last pap and high risk HPV testing:   02/14/10 neg History of abnormal pap smear:  HPV 2002   OB History   Grav Para Term Preterm Abortions TAB SAB Ect Mult Living   0 0 0 0 0 0 0 0 0 0        LIFESTYLE: Exercise:  no             Tobacco: no Alcohol:no Drug use:  no  OTHER HEALTH MAINTENANCE: Tetanus/TDap: 03/2013 Gardisil:no Influenza:  05/2013 Zostavax:02/2014  Bone density:01/2010 osteopenia Colonoscopy:2005 normal. Scheduled for 06/2014  Cholesterol check: PCP  Family History  Problem Relation Age of Onset  . Alzheimer's disease Mother   . Allergies Mother   . Breast cancer Mother 44  . Lung cancer Father 16  . Allergies Brother   . Diabetes Brother 74  . Alzheimer's disease Brother 75    early onset  . Transient ischemic attack Maternal Grandmother   . Peripheral Artery Disease Maternal Grandmother     s/p B leg amputation  . Sudden death Paternal Grandmother 61    unknown    Patient Active Problem List   Diagnosis Date Noted  . HYPERLIPIDEMIA 11/01/2010  . DEPRESSION 11/01/2010  . MIGRAINE HEADACHE 11/01/2010  . OAB (overactive  bladder)   . GERD 09/23/2007  . Perennial allergic rhinitis with seasonal variation 09/22/2007   Past Medical History  Diagnosis Date  . ALLERGIC RHINITIS   . DEPRESSION   . GERD   . HYPERLIPIDEMIA     diet control efforts  . MIGRAINE HEADACHE   . URINARY INCONTINENCE     OAB  . Dense breast   . Fibroid   . PMB (postmenopausal bleeding) 12/2001    polyps  . Osteopenia 02/2006    Past Surgical History  Procedure Laterality Date  . Left knee surgey    . Dilation and curettage of uterus  2003  . Cataract extraction Bilateral 07/2012, 09/2012  . Wisdom tooth extraction    . Hysteroscopy  01/2002    resection, sm fibroid    ALLERGIES: Review of patient's allergies indicates no known allergies.  Current Outpatient Prescriptions  Medication Sig Dispense Refill  . azelastine (ASTELIN) 0.1 % nasal spray Place 2 sprays into both nostrils 2 (two) times daily. Use in each nostril as directed  30 mL  12  . Calcium Citrate-Vitamin D (CITRACAL + D PO) Take 1,200 mg by mouth daily.      . Cholecalciferol (VITAMIN D3) 2000 UNITS capsule Take 2,000 Units by mouth daily.      Marland Kitchen estrogens, conjugated, (PREMARIN) 0.3 MG tablet  Take 1 tablet (0.3 mg total) by mouth daily. Take daily  90 tablet  3  . famotidine (PEPCID AC) 10 MG chewable tablet Chew 10 mg by mouth 2 (two) times daily.      Marland Kitchen ipratropium (ATROVENT) 0.06 % nasal spray 1-2 puffs each nostril up to 4 times daily as needed  15 mL  12  . mometasone (NASONEX) 50 MCG/ACT nasal spray Place 1 spray into the nose at bedtime.  51 g  3  . montelukast (SINGULAIR) 10 MG tablet Take 1 tablet (10 mg total) by mouth at bedtime.  30 tablet  11  . Multiple Vitamins-Minerals (CENTRUM SILVER) tablet Take 1 tablet by mouth daily.      Marland Kitchen MYRBETRIQ 50 MG TB24 tablet TAKE 1 TABLET BY MOUTH EVERY DAY  30 tablet  5  . Omega-3 Fatty Acids (FISH OIL) 1200 MG CAPS Take by mouth daily.      Marland Kitchen omeprazole (PRILOSEC OTC) 20 MG tablet Take 20 mg by mouth as needed.       . progesterone (PROMETRIUM) 100 MG capsule Take 1 capsule (100 mg total) by mouth at bedtime.  30 capsule  11  . Psyllium (METAMUCIL PO) Take by mouth as needed.      . TURMERIC PO Take 1 capsule by mouth daily.      . vitamin E 400 UNIT capsule Take 400 Units by mouth daily.       No current facility-administered medications for this visit.     ROS:  Pertinent items are noted in HPI.  SOCIAL HISTORY:  Retired.   PHYSICAL EXAMINATION:    BP 124/80  Pulse 70  Resp 14  Ht 5\' 5"  (1.651 m)  Wt 145 lb 3.2 oz (65.862 kg)  BMI 24.16 kg/m2  LMP 09/03/1996   Wt Readings from Last 3 Encounters:  04/09/14 145 lb 3.2 oz (65.862 kg)  02/15/14 146 lb (66.225 kg)  02/03/14 144 lb (65.318 kg)     Ht Readings from Last 3 Encounters:  04/09/14 5\' 5"  (1.651 m)  02/15/14 5\' 5"  (1.651 m)  04/08/13 5' 5.25" (1.657 m)    General appearance: alert, cooperative and appears stated age Head: Normocephalic, without obvious abnormality, atraumatic Neck: no adenopathy, supple, symmetrical, trachea midline and thyroid not enlarged, symmetric, no tenderness/mass/nodules Lungs: clear to auscultation bilaterally Breasts: Inspection negative, No nipple retraction or dimpling, No nipple discharge or bleeding, No axillary or supraclavicular adenopathy, Normal to palpation without dominant masses Heart: regular rate and rhythm Abdomen: soft, non-tender; no masses,  no organomegaly Extremities: extremities normal, atraumatic, no cyanosis or edema Skin: Skin color, texture, turgor normal. No rashes or lesions Lymph nodes: Cervical, supraclavicular, and axillary nodes normal. No abnormal inguinal nodes palpated Neurologic: Grossly normal  Pelvic: External genitalia:  no lesions              Urethra:  normal appearing urethra with no masses, tenderness or lesions              Bartholins and Skenes: normal                 Vagina: normal appearing vagina with normal color and discharge, no lesions               Cervix: normal appearance              Pap: Yes.          Bimanual Exam:  Uterus:  uterus is normal size, shape, consistency and nontender  Adnexa: normal adnexa in size, nontender and no masses                                      Rectovaginal: Confirms                                      Anus:  normal sphincter tone, no lesions  ASSESSMENT  Normal gynecologic exam. HRT patient.  Osteopenia.   PLAN  Mammogram recommended yearly.  Pap smear done. Counseled on self breast exam, Calcium and vitamin D intake, exercise. Premarin 0.3 mg daily and Prometrium 100 mg daily.  Discussed benefits and risks of DTV, PE, MI, stroke and breast cancer.  Wishes to continue.   Bone density recommended. Return annually or prn   An After Visit Summary was printed and given to the patient.

## 2014-04-09 NOTE — Patient Instructions (Signed)

## 2014-04-15 ENCOUNTER — Other Ambulatory Visit: Payer: Self-pay | Admitting: Internal Medicine

## 2014-04-16 LAB — IPS PAP SMEAR ONLY

## 2014-04-18 NOTE — Assessment & Plan Note (Signed)
Reviewed reflux precautions

## 2014-04-18 NOTE — Assessment & Plan Note (Signed)
She believes throat discomfort is from postnasal drip. We will address her concern first but I think more likely which he is feeling his throat irritation from GERD despite her acid blockers. Plan-Singulair, ipratropium nasal spray, Sudafed PE

## 2014-04-19 ENCOUNTER — Ambulatory Visit
Admission: RE | Admit: 2014-04-19 | Discharge: 2014-04-19 | Disposition: A | Discharge status: Home / Self Care | Payer: Medicare Other | Source: Ambulatory Visit | Attending: Obstetrics and Gynecology | Admitting: Obstetrics and Gynecology

## 2014-04-19 DIAGNOSIS — M8580 Other specified disorders of bone density and structure, unspecified site: Secondary | ICD-10-CM

## 2014-04-19 NOTE — Addendum Note (Signed)
Addended by: Tacy Learn, Ryatt Corsino E on: 04/19/2014 10:46 AM   Modules accepted: Orders

## 2014-04-20 LAB — IPS HPV ON A LIQUID BASED SPECIMEN

## 2014-04-21 ENCOUNTER — Other Ambulatory Visit: Payer: Self-pay | Admitting: *Deleted

## 2014-04-21 MED ORDER — MIRABEGRON ER 50 MG PO TB24: ORAL_TABLET | ORAL | Status: DC

## 2014-04-21 NOTE — Telephone Encounter (Signed)
Pt stated at her cpx visit she was suppose to get a yearly supply on her mytribig. Keep getting 30 day with no refills.  Requesting refills to be sent for year. Inform pt will send....Johny Chess

## 2014-06-16 LAB — HM COLONOSCOPY

## 2014-08-24 ENCOUNTER — Telehealth: Payer: Self-pay

## 2014-08-24 NOTE — Telephone Encounter (Signed)
New prescription form from [facility] regarding Premarin tablet 0.3 mg faxed to [phone]. Filled out and signed by provider. Faxed with cover sheet and confirmation.  Routing to provider for final review. Patient agreeable to disposition. Will close encounter

## 2014-08-31 ENCOUNTER — Telehealth: Payer: Self-pay | Admitting: Obstetrics and Gynecology

## 2014-08-31 NOTE — Telephone Encounter (Signed)
Pt is calling to get directions for Premarin 0.3 mg please call [phone] reference # [id number]

## 2014-08-31 NOTE — Telephone Encounter (Signed)
Spoke with Mickel Baas at [phone] with [facility] who states that directions portion of rx for Premarin was not filled out. Advised patient will take one 0.3 mg tablet daily. Mickel Baas agreeable and states patient has 2 refills remaining on rx.   Routing to provider for final review. Patient agreeable to disposition. Will close encounter

## 2014-09-08 ENCOUNTER — Encounter: Payer: Self-pay | Admitting: Internal Medicine

## 2014-10-01 ENCOUNTER — Telehealth: Payer: Self-pay | Admitting: Obstetrics and Gynecology

## 2014-10-01 NOTE — Telephone Encounter (Signed)
Patient is calling regarding her Premarin prescription. Patient needs to change due to cost. Patient sent [place] a form last month for Dr.Silva to choose a replacement medication. [facility] has not gotten the new replacement medication.

## 2014-10-01 NOTE — Telephone Encounter (Signed)
Spoke with patient. Patient states that she prefers to stay on Premarin if she is able but the cost is too expensive. Advised since the insurance is willing to fill the medication and it is too costly PA will not help to reduce cost. Patient is agreeable. "I am on the Premarin to help with my bone health. I was under the impression that the alternatives they have provided will not help with that. " Advised patient will need to speak with provider in regard to Premarin and alternatives and return call. Patient is agreeable. Alternatives provided by insurance company include premarin vaginal cream, alendronate, citalopram, and fluoxetine. Please advise.

## 2014-10-05 NOTE — Telephone Encounter (Signed)
Spoke with patient. Advised patient of message as seen below. Patient becomes frustrated with recommendations. "What I do not understand is if being on hormone replacement therapy will help my bone health why I can not just have another medication?" Advised patient that medications that are listed as covered medications as alternative will not have the same effect. Advised insurance is currently covering medication unfortunately the price is elevated. Apologized as PA will not help since it is a covered medication. Patient is agreeable and would like to think about if she would like to come off of current medication or come off until next BMD.  Dr.Silva, anything further needed or recommended?

## 2014-10-05 NOTE — Telephone Encounter (Signed)
I welcome the patient to come in for consultation with me regarding these medications and their indications.

## 2014-10-05 NOTE — Telephone Encounter (Signed)
Patient's HRT was started for menopausal symptoms.  Currently she has a diagnosis of mild osteopenia. The HRT helps to prevent osteoporosis.   She does not need to stay on the HRT if she wishes a trial of coming off.   Her next bone density is due in 2017.  I recommend re-evaluation of her bone density at that time.  I do not recommend a bisphosphonate medication (alendronate) at this time as her fracture risk is not considered high enough.   I hope this helps to clarify.

## 2014-10-05 NOTE — Telephone Encounter (Signed)
Spoke with patient. Patient is calling to check on status of call. Advised will need to speak with covering provider and return call. Patient is agreeable.

## 2014-10-22 NOTE — Telephone Encounter (Signed)
Pharmacy called but declined to leave a message to call back opting instead to fax what they are calling about.

## 2014-10-22 NOTE — Telephone Encounter (Signed)
Received PA for patient's Premarin rx as insurance states it will no longer cover this medication. PA filled out and to Dr.Silva's desk for review and advise.

## 2014-10-24 NOTE — Telephone Encounter (Signed)
She can choose to stop the Premarin and switch to another alternative. I believe that Femring was on the list of approved medications. She can also take Estradiol generic and receive the medication retail for $4 per month from [facility] or [facility]?

## 2014-10-25 MED ORDER — ESTRADIOL 0.5 MG PO TABS: 0.5000 mg | ORAL_TABLET | Freq: Every day | ORAL | Status: DC

## 2014-10-25 NOTE — Telephone Encounter (Signed)
Estradiol 0.5mg  #30 5RF sent to pharmacy on file. Patient is aware she will need to stop Premarin.  Routing to provider for final review. Patient agreeable to disposition. Will close encounter

## 2014-10-25 NOTE — Telephone Encounter (Signed)
I will authorize estradiol 0.5 mg oral tablets, one by mouth daily, #30, RF- 5.  Please send to her pharmacy. Stop Premarin.  Keep appointment for annual exam in August and mammogram in mammogram due in June.

## 2014-10-25 NOTE — Telephone Encounter (Signed)
Spoke with patient. Advised patient of message as seen below from [place]. Patient is agreeable and verbalizes understanding. Patient would like to switch to Estradiol at this time. "Can it just be sent to [facility]? That is my preferred pharmacy and I do not want to have to have things filled at different places." Advised patient rx can be sent to [facility] but I can not guarantee the cost at that location. Advised I am only aware that it will cost $4 with cash pay at [facility] and [facility]. Patient would like rx be sent to [facility] and if it is too expensive will call back to transfer rx.  Dr.Silva, please advise dosage. I will be happy to place order.

## 2015-01-03 ENCOUNTER — Other Ambulatory Visit: Payer: Self-pay

## 2015-01-03 DIAGNOSIS — Z1231 Encounter for screening mammogram for malignant neoplasm of breast: Secondary | ICD-10-CM

## 2015-01-07 ENCOUNTER — Encounter: Payer: Self-pay | Admitting: Nurse Practitioner

## 2015-01-07 ENCOUNTER — Ambulatory Visit (INDEPENDENT_AMBULATORY_CARE_PROVIDER_SITE_OTHER): Payer: Medicare Other | Admitting: Nurse Practitioner

## 2015-01-07 VITALS — BP 130/84 | HR 64 | Temp 98.0°F | Ht 65.0 in | Wt 142.0 lb

## 2015-01-07 DIAGNOSIS — R300 Dysuria: Secondary | ICD-10-CM | POA: Diagnosis not present

## 2015-01-07 LAB — POCT URINALYSIS DIPSTICK
Bilirubin, UA: NEGATIVE
Glucose, UA: NEGATIVE
Ketones, UA: NEGATIVE
Nitrite, UA: NEGATIVE
Urobilinogen, UA: NEGATIVE
pH, UA: 8

## 2015-01-07 MED ORDER — PHENAZOPYRIDINE HCL 200 MG PO TABS: 200.0000 mg | ORAL_TABLET | Freq: Three times a day (TID) | ORAL | Status: DC | PRN

## 2015-01-07 MED ORDER — CIPROFLOXACIN HCL 500 MG PO TABS: 500.0000 mg | ORAL_TABLET | Freq: Two times a day (BID) | ORAL | Status: DC

## 2015-01-07 NOTE — Patient Instructions (Signed)

## 2015-01-07 NOTE — Progress Notes (Signed)
S:  69 y.o.Married white G0P0 female presents with complaint of UTI. Sudden onset today at 5:30 am.   With symptoms of blood in urine, dysuria, urinary frequency.  Pertinent negatives include no constitutional symptoms, denying fever, chills, anorexia, or weight loss.Mary Malone active no secondary to husbands prostate cancer.  Stopped HRT in March and has noted an increase in vaginal dryness.  Symptoms not related to post coital.  Menopausal yes.   Same partner without change. Last UTI documented in her 62's.  ROS: no weight loss, fever, night sweats and feels well  O alert, oriented to person, place, and time, normal mood, behavior, speech, dress, motor activity, and thought processes   healthy,  alert and  not in acute distress  Abdomen: mild suprapubic  No CVA tenderness  pelvic: deferred   Diagnostic Test:    Urinalysis:  Large RBC, 2+ protein, 3+ leuk's   urine culture  Assessment: UTI with gross hematuria  Plan:  Maintain adequate hydration. Follow up if symptoms not improving, and as needed.   Medication Therapy:  Cipro 500 mg BID # 14   Pyridium 200 mg tid prn   Lab:TOC in 2 weeks - will also do a pelvic exam and see if she needs vaginal estrogen as well to the urethra.     RV

## 2015-01-08 LAB — URINALYSIS, MICROSCOPIC ONLY
Bacteria, UA: NONE SEEN
Casts: NONE SEEN
Crystals: NONE SEEN
Squamous Epithelial / LPF: NONE SEEN

## 2015-01-09 NOTE — Progress Notes (Signed)
Encounter reviewed by Dr. Eliezer Khawaja Silva.  

## 2015-01-10 LAB — URINE CULTURE: Colony Count: >=100000

## 2015-01-21 ENCOUNTER — Ambulatory Visit (INDEPENDENT_AMBULATORY_CARE_PROVIDER_SITE_OTHER): Payer: Medicare Other | Admitting: Nurse Practitioner

## 2015-01-21 ENCOUNTER — Encounter: Payer: Self-pay | Admitting: Nurse Practitioner

## 2015-01-21 VITALS — BP 134/76 | HR 68 | Ht 65.0 in | Wt 140.0 lb

## 2015-01-21 DIAGNOSIS — R319 Hematuria, unspecified: Secondary | ICD-10-CM | POA: Diagnosis not present

## 2015-01-21 DIAGNOSIS — N390 Urinary tract infection, site not specified: Secondary | ICD-10-CM | POA: Diagnosis not present

## 2015-01-21 MED ORDER — ESTROGENS, CONJUGATED 0.625 MG/GM VA CREA: 1.0000 | TOPICAL_CREAM | Freq: Every day | VAGINAL | Status: DC

## 2015-01-21 NOTE — Progress Notes (Signed)
Subjective:     Patient ID: Mary Malone, female   DOB: 02-Feb-1946, 69 y.o.   MRN: [id number]  HPI 69 y.o.Married white G0P0 female presents for a follow up on E Coli UTI that was diagnosed on 01/07/15 and treated with Cipro 500 mg BID.  She has completed all of med's and has no dysuria.  She initially had hematuria with dysuria and urinary frequency.  She denies fever and chills.   Pertinent negatives include no constitutional symptoms, denying fever, chills, anorexia, or weight loss. Not sexually active secondary to husbands prostate cancer. Stopped HRT in March and has noted an increase in vaginal dryness. Symptoms not related to post coital. Menopausal yes. Same partner without change.  Last UTI documented in her 10's.  She would like to use Premarin vaginal cream as we discussed on last visit.  Review of Systems  Constitutional: Negative for fever, chills, diaphoresis and fatigue.  Respiratory: Negative.   Cardiovascular: Negative.   Gastrointestinal: Negative.   Genitourinary: Negative for dysuria, urgency, frequency, flank pain, decreased urine volume, vaginal bleeding, vaginal discharge, pelvic pain and dyspareunia.  Musculoskeletal: Negative.   Skin: Negative.   Neurological: Negative.   Psychiatric/Behavioral: Negative.        Objective:   Physical Exam  Constitutional: She appears well-developed and well-nourished.  Pulmonary/Chest: Effort normal.  Abdominal: Soft.  Genitourinary:     Very atrophic labia and introitus.  Speculum is not inserted.  There is an area about 1/2 cm round lesion on left labia that is red but not painful.  She has a slight urethral prolapse and with the stenosis and atrophy the urethra is under the opening of the introitus.       Assessment:     Atrophic vaginitis TOC -  E Coli UTI    Plan:     Will start her on Premarin vaginal cream pea size amount to the urethra three times a week for a month, then try 0.5 gm intravaginally and  urethra twice a week. Will follow with urine culture and micro She will have a recheck on 8/17 with AEX with Dr. Quincy Simmonds

## 2015-01-21 NOTE — Patient Instructions (Addendum)
Atrophic Vaginitis Atrophic vaginitis is a problem of low levels of estrogen in women. This problem can happen at any age. It is most common in women who have gone through menopause ("the change").  HOW WILL I KNOW IF I HAVE THIS PROBLEM? You may have:  Trouble with peeing (urinating), such as:  Going to the bathroom often.  A hard time holding your pee until you reach a bathroom.  Leaking pee.  Having pain when you pee.  Itching or a burning feeling.  Vaginal bleeding and spotting.  Pain during sex.  Dryness of the vagina.  A yellow, bad-smelling fluid (discharge) coming from the vagina. HOW WILL MY DOCTOR CHECK FOR THIS PROBLEM?  During your exam, your doctor will likely find the problem.  If there is a vaginal fluid, it may be checked for infection. HOW WILL THIS PROBLEM BE TREATED? Keep the vulvar skin as clean as possible. Moisturizers and lubricants can help with some of the symptoms. Estrogen replacement can help. There are 2 ways to take estrogen:  Systemic estrogen gets estrogen to your whole body. It takes many weeks or months before the symptoms get better.  You take an estrogen pill.  You use a skin patch. This is a patch that you put on your skin.  If you still have your uterus, your doctor may ask you to take a hormone. Talk to your doctor about the right medicine for you.  Estrogen cream.  This puts estrogen only at the part of your body where you apply it. The cream is put into the vagina or put on the vulvar skin. For some women, estrogen cream works faster than pills or the patch. CAN ALL WOMEN WITH THIS PROBLEM USE ESTROGEN? No. Women with certain types of cancer, liver problems, or problems with blood clots should not take estrogen. Your doctor can help you decide the best treatment for your symptoms. Document Released: 02/06/2008 Document Revised: 08/25/2013 Document Reviewed: 02/06/2008 [facility] Patient Information 2015 [place], [place]. This  information is not intended to replace advice given to you by your health care provider. Make sure you discuss any questions you have with your health care provider.  Premarin 'pea size' amount to external labia and urethra at bedtime 3 times a week - for a month.  Then try 0.5 gm intravaginally twice a week along with pea size amount external as directed.  Will recheck at Annual

## 2015-01-22 LAB — URINALYSIS, MICROSCOPIC ONLY
Bacteria, UA: NONE SEEN
Casts: NONE SEEN
Crystals: NONE SEEN
Squamous Epithelial / LPF: NONE SEEN

## 2015-01-22 LAB — URINE CULTURE: Colony Count: 8000

## 2015-01-23 NOTE — Progress Notes (Signed)
Encounter reviewed by Dr. Nahmir Zeidman Silva.  

## 2015-02-04 ENCOUNTER — Ambulatory Visit
Admission: RE | Admit: 2015-02-04 | Discharge: 2015-02-04 | Disposition: A | Discharge status: Home / Self Care | Payer: Medicare Other | Source: Ambulatory Visit

## 2015-02-04 DIAGNOSIS — Z1231 Encounter for screening mammogram for malignant neoplasm of breast: Secondary | ICD-10-CM

## 2015-02-09 ENCOUNTER — Encounter: Payer: Medicare Other | Admitting: Internal Medicine

## 2015-02-10 ENCOUNTER — Ambulatory Visit (INDEPENDENT_AMBULATORY_CARE_PROVIDER_SITE_OTHER): Payer: Medicare Other | Admitting: Internal Medicine

## 2015-02-10 ENCOUNTER — Encounter: Payer: Self-pay | Admitting: Internal Medicine

## 2015-02-10 ENCOUNTER — Other Ambulatory Visit (INDEPENDENT_AMBULATORY_CARE_PROVIDER_SITE_OTHER): Payer: Medicare Other

## 2015-02-10 VITALS — BP 130/72 | HR 74 | Temp 98.3°F | Resp 16 | Ht 65.0 in | Wt 141.8 lb

## 2015-02-10 DIAGNOSIS — E785 Hyperlipidemia, unspecified: Secondary | ICD-10-CM | POA: Diagnosis not present

## 2015-02-10 DIAGNOSIS — Z0000 Encounter for general adult medical examination without abnormal findings: Secondary | ICD-10-CM | POA: Diagnosis not present

## 2015-02-10 DIAGNOSIS — J309 Allergic rhinitis, unspecified: Secondary | ICD-10-CM

## 2015-02-10 DIAGNOSIS — Z23 Encounter for immunization: Secondary | ICD-10-CM

## 2015-02-10 DIAGNOSIS — N3281 Overactive bladder: Secondary | ICD-10-CM | POA: Diagnosis not present

## 2015-02-10 DIAGNOSIS — K219 Gastro-esophageal reflux disease without esophagitis: Secondary | ICD-10-CM

## 2015-02-10 DIAGNOSIS — J3089 Other allergic rhinitis: Secondary | ICD-10-CM

## 2015-02-10 DIAGNOSIS — J302 Other seasonal allergic rhinitis: Secondary | ICD-10-CM

## 2015-02-10 LAB — COMPREHENSIVE METABOLIC PANEL
ALT: 14 U/L (ref 0–35)
AST: 17 U/L (ref 0–37)
Albumin: 4.4 g/dL (ref 3.5–5.2)
Alkaline Phosphatase: 57 U/L (ref 39–117)
BUN: 18 mg/dL (ref 6–23)
CO2: 31 mEq/L (ref 19–32)
Calcium: 10.1 mg/dL (ref 8.4–10.5)
Chloride: 105 mEq/L (ref 96–112)
Creatinine, Ser: 0.84 mg/dL (ref 0.40–1.20)
GFR: 71.67 mL/min (ref >60.00)
Glucose, Bld: 107 mg/dL — ABNORMAL HIGH (ref 70–99)
Potassium: 4.6 mEq/L (ref 3.5–5.1)
Sodium: 140 mEq/L (ref 135–145)
Total Bilirubin: 0.4 mg/dL (ref 0.2–1.2)
Total Protein: 7.3 g/dL (ref 6.0–8.3)

## 2015-02-10 LAB — CBC
HCT: 40.1 % (ref 36.0–46.0)
Hemoglobin: 13.5 g/dL (ref 12.0–15.0)
MCHC: 33.7 g/dL (ref 30.0–36.0)
MCV: 93.5 fl (ref 78.0–100.0)
Platelets: 250 10*3/uL (ref 150.0–400.0)
RBC: 4.29 Mil/uL (ref 3.87–5.11)
RDW: 12.9 % (ref 11.5–15.5)
WBC: 6.8 10*3/uL (ref 4.0–10.5)

## 2015-02-10 LAB — LIPID PANEL
Cholesterol: 211 mg/dL — ABNORMAL HIGH (ref 0–200)
HDL: 62.8 mg/dL (ref >39.00)
LDL Cholesterol: 118 mg/dL — ABNORMAL HIGH (ref 0–99)
NonHDL: 148.2
Total CHOL/HDL Ratio: 3
Triglycerides: 152 mg/dL — ABNORMAL HIGH (ref 0.0–149.0)
VLDL: 30.4 mg/dL (ref 0.0–40.0)

## 2015-02-10 LAB — TSH: TSH: 3.03 u[IU]/mL (ref 0.35–4.50)

## 2015-02-10 MED ORDER — MIRABEGRON ER 50 MG PO TB24: ORAL_TABLET | ORAL | Status: DC

## 2015-02-10 NOTE — Patient Instructions (Signed)
We have given you the prevnar today which is the pneumonia booster shot.   We will check the labs and call you back about the results.   Come back in about 1 year unless you have new problems and feel free to call with questions.   Back Exercises These exercises may help you when beginning to rehabilitate your injury. Your symptoms may resolve with or without further involvement from your physician, physical therapist or athletic trainer. While completing these exercises, remember:   Restoring tissue flexibility helps normal motion to return to the joints. This allows healthier, less painful movement and activity.  An effective stretch should be held for at least 30 seconds.  A stretch should never be painful. You should only feel a gentle lengthening or release in the stretched tissue. STRETCH - Extension, Prone on Elbows   Lie on your stomach on the floor, a bed will be too soft. Place your palms about shoulder width apart and at the height of your head.  Place your elbows under your shoulders. If this is too painful, stack pillows under your chest.  Allow your body to relax so that your hips drop lower and make contact more completely with the floor.  Hold this position for __________ seconds.  Slowly return to lying flat on the floor. Repeat __________ times. Complete this exercise __________ times per day.  RANGE OF MOTION - Extension, Prone Press Ups   Lie on your stomach on the floor, a bed will be too soft. Place your palms about shoulder width apart and at the height of your head.  Keeping your back as relaxed as possible, slowly straighten your elbows while keeping your hips on the floor. You may adjust the placement of your hands to maximize your comfort. As you gain motion, your hands will come more underneath your shoulders.  Hold this position __________ seconds.  Slowly return to lying flat on the floor. Repeat __________ times. Complete this exercise __________  times per day.  RANGE OF MOTION- Quadruped, Neutral Spine   Assume a hands and knees position on a firm surface. Keep your hands under your shoulders and your knees under your hips. You may place padding under your knees for comfort.  Drop your head and point your tail bone toward the ground below you. This will round out your low back like an angry cat. Hold this position for __________ seconds.  Slowly lift your head and release your tail bone so that your back sags into a large arch, like an old horse.  Hold this position for __________ seconds.  Repeat this until you feel limber in your low back.  Now, find your "sweet spot." This will be the most comfortable position somewhere between the two previous positions. This is your neutral spine. Once you have found this position, tense your stomach muscles to support your low back.  Hold this position for __________ seconds. Repeat __________ times. Complete this exercise __________ times per day.  STRETCH - Flexion, Single Knee to Chest   Lie on a firm bed or floor with both legs extended in front of you.  Keeping one leg in contact with the floor, bring your opposite knee to your chest. Hold your leg in place by either grabbing behind your thigh or at your knee.  Pull until you feel a gentle stretch in your low back. Hold __________ seconds.  Slowly release your grasp and repeat the exercise with the opposite side. Repeat __________ times. Complete this exercise  __________ times per day.  STRETCH - Hamstrings, Standing  Stand or sit and extend your right / left leg, placing your foot on a chair or foot stool  Keeping a slight arch in your low back and your hips straight forward.  Lead with your chest and lean forward at the waist until you feel a gentle stretch in the back of your right / left knee or thigh. (When done correctly, this exercise requires leaning only a small distance.)  Hold this position for __________  seconds. Repeat __________ times. Complete this stretch __________ times per day. STRENGTHENING - Deep Abdominals, Pelvic Tilt   Lie on a firm bed or floor. Keeping your legs in front of you, bend your knees so they are both pointed toward the ceiling and your feet are flat on the floor.  Tense your lower abdominal muscles to press your low back into the floor. This motion will rotate your pelvis so that your tail bone is scooping upwards rather than pointing at your feet or into the floor.  With a gentle tension and even breathing, hold this position for __________ seconds. Repeat __________ times. Complete this exercise __________ times per day.  STRENGTHENING - Abdominals, Crunches   Lie on a firm bed or floor. Keeping your legs in front of you, bend your knees so they are both pointed toward the ceiling and your feet are flat on the floor. Cross your arms over your chest.  Slightly tip your chin down without bending your neck.  Tense your abdominals and slowly lift your trunk high enough to just clear your shoulder blades. Lifting higher can put excessive stress on the low back and does not further strengthen your abdominal muscles.  Control your return to the starting position. Repeat __________ times. Complete this exercise __________ times per day.  STRENGTHENING - Quadruped, Opposite UE/LE Lift   Assume a hands and knees position on a firm surface. Keep your hands under your shoulders and your knees under your hips. You may place padding under your knees for comfort.  Find your neutral spine and gently tense your abdominal muscles so that you can maintain this position. Your shoulders and hips should form a rectangle that is parallel with the floor and is not twisted.  Keeping your trunk steady, lift your right hand no higher than your shoulder and then your left leg no higher than your hip. Make sure you are not holding your breath. Hold this position __________  seconds.  Continuing to keep your abdominal muscles tense and your back steady, slowly return to your starting position. Repeat with the opposite arm and leg. Repeat __________ times. Complete this exercise __________ times per day. Document Released: 09/07/2005 Document Revised: 11/12/2011 Document Reviewed: 12/02/2008 [facility] Patient Information 2015 [place], [place]. This information is not intended to replace advice given to you by your health care provider. Make sure you discuss any questions you have with your health care provider.

## 2015-02-10 NOTE — Progress Notes (Signed)
Pre visit review using our clinic review tool, if applicable. No additional management support is needed unless otherwise documented below in the visit note. 

## 2015-02-10 NOTE — Progress Notes (Signed)
   Subjective:    Patient ID: Mary Malone, female    DOB: May 27, 1946, 69 y.o.   MRN: [id number]  HPI Here for medicare wellness, no new complaints. Please see A/P for status and treatment of chronic medical problems.   Diet: heart healthy Physical activity: sedentary Depression/mood screen: negative Hearing: intact to whispered voice Visual acuity: grossly normal, performs annual eye exam  ADLs: capable Fall risk: none Home safety: good Cognitive evaluation: intact to orientation, naming, recall and repetition EOL planning: adv directives discussed  I have personally reviewed and have noted 1. The patient's medical and social history - reviewed today no changes 2. Their use of alcohol, tobacco or illicit drugs 3. Their current medications and supplements 4. The patient's functional ability including ADL's, fall risks, home safety risks and hearing or visual impairment. 5. Diet and physical activities 6. Evidence for depression or mood disorders 7. Care team reviewed and updated (available in snapshot)  Review of Systems  Constitutional: Negative for fever, activity change, appetite change, fatigue and unexpected weight change.  HENT: Negative.   Eyes: Negative.   Respiratory: Negative for cough, chest tightness, shortness of breath and wheezing.   Cardiovascular: Negative for chest pain, palpitations and leg swelling.  Gastrointestinal: Negative for abdominal pain, diarrhea, constipation and abdominal distention.  Musculoskeletal: Positive for back pain. Negative for myalgias and arthralgias.  Skin: Negative.   Neurological: Negative.   Psychiatric/Behavioral: Negative.       Objective:   Physical Exam  Constitutional: She is oriented to person, place, and time. She appears well-developed and well-nourished.  HENT:  Head: Normocephalic and atraumatic.  Right Ear: External ear normal.  Left Ear: External ear normal.  Eyes: EOM are normal.  Neck: Normal range of  motion.  Cardiovascular: Normal rate and regular rhythm.   Pulmonary/Chest: Effort normal. No respiratory distress. She has no wheezes.  Abdominal: Soft. Bowel sounds are normal.  Musculoskeletal: She exhibits no edema.  Neurological: She is alert and oriented to person, place, and time. Coordination normal.  Skin: Skin is warm and dry.  Psychiatric: She has a normal mood and affect.   Filed Vitals:   02/10/15 1430  BP: 130/72  Pulse: 74  Temp: 98.3 F (36.8 C)  TempSrc: Oral  Resp: 16  Height: 5\' 5"  (1.651 m)  Weight: 141 lb 12.8 oz (64.32 kg)  SpO2: 95%      Assessment & Plan:  Prevnar given at visit.

## 2015-02-11 ENCOUNTER — Encounter: Payer: Self-pay | Admitting: Internal Medicine

## 2015-02-11 DIAGNOSIS — Z0000 Encounter for general adult medical examination without abnormal findings: Secondary | ICD-10-CM | POA: Insufficient documentation

## 2015-02-11 NOTE — Assessment & Plan Note (Signed)
Uses prilosec when needed.

## 2015-02-11 NOTE — Assessment & Plan Note (Signed)
Refill of myrbetriq provided today which she thinks is helping some.

## 2015-02-11 NOTE — Assessment & Plan Note (Signed)
Fairly stable on current regimen and sees allergy specialist yearly

## 2015-02-11 NOTE — Assessment & Plan Note (Signed)
List of 10 year screening recommendations given to her at visit. Prevnar given today to complete pneumonia series. Up to date on vaccinations, colonoscopy, mammogram. Talked to her about safe lifting techniques and sun safety with sunscreen or protective clothing.

## 2015-02-17 ENCOUNTER — Ambulatory Visit (INDEPENDENT_AMBULATORY_CARE_PROVIDER_SITE_OTHER): Payer: Medicare Other | Admitting: Internal Medicine

## 2015-02-17 ENCOUNTER — Encounter: Payer: Self-pay | Admitting: Internal Medicine

## 2015-02-17 VITALS — BP 130/68 | HR 74 | Ht 65.0 in | Wt 142.0 lb

## 2015-02-17 DIAGNOSIS — J3089 Other allergic rhinitis: Secondary | ICD-10-CM

## 2015-02-17 DIAGNOSIS — K219 Gastro-esophageal reflux disease without esophagitis: Secondary | ICD-10-CM

## 2015-02-17 DIAGNOSIS — J302 Other seasonal allergic rhinitis: Secondary | ICD-10-CM

## 2015-02-17 DIAGNOSIS — J309 Allergic rhinitis, unspecified: Secondary | ICD-10-CM

## 2015-02-17 MED ORDER — AZELASTINE HCL 0.1 % NA SOLN: NASAL | Status: DC

## 2015-02-17 NOTE — Patient Instructions (Signed)
We have refilled azelastine antihistamine nasal spray, to use with Nasacort while needed.  If your ear keeps bothering you, you could try otc nasal spray            Nasalcrom/ cromol/ cromolyn. OTC, but you may need to ask the druggist to help find it.   Also if your ear is no better, I suggest you see an ENT. We can help with referral if needed.

## 2015-02-17 NOTE — Progress Notes (Signed)
12/28/11- 77 yoF never smoker (widow- husb died of lung Ca), followed for allergic rhinitis, complicated by GERD. And nasal congestion, perennial, worse lying down. Sometimes makes her get up. Fluid feeling in the ears without popping. Ears itch. Pollen is making her sneeze and cough without wheeze. Postnasal drip varies. Denies headache, purulent discharge or fever. Taking occasional Claritin-D and Nasonex does help.  01/22/13- 21 yoF never smoker (widow- husb died of lung Ca), followed for allergic rhinitis, complicated by GERD. FOLLOWS FOR: throat feels fuller than usual when outside(feels like a pollen collection) and having dizzy spells from ears (both). Almost feels like there is fluid in the ears. REFILLS for 90 day supply for Nasonex and Astepro sent to pharmacy on file. Pollen season only caused throat irritation this year. Some pressure in her ears made her feel unsteady. Today she feels well. Likes Nasonex. Denies cough. Aware of chronic GERD. Takes Prilosec every other day as a compromise to minimize bone loss.  02/15/14- 42 yoF never smoker (widow- husb died of lung Ca), followed for allergic rhinitis, complicated by GERD. FOLLOWS FOR: PND-white in color-happens every day and getting stuck in her throat. Using Nasonex and Astepro at night. Alternates Pepcid with Prilosec but does not feel reflux.  02/17/15- 32 yoF never smoker (widow- husb died of lung Ca), followed for allergic rhinitis, complicated by GERD. Follows For: Pt c/o ear and throat inflammation. Pt no longer taking nasonex but switched to nasacort.  Nasacort plus Astelin usually helps. Hearing her heartbeat in the left ear. Not tinnitus. Primary physician told her throat was red. She denies reflux symptoms.  ROS-see HPI Constitutional:   No-   weight loss, night sweats, fevers, chills, fatigue, lassitude. HEENT:   No-  headaches, difficulty swallowing, tooth/dental problems, sore throat,        sneezing, itching, +ear ache,  nasal congestion, +post nasal drip,  CV:  No-   chest pain, orthopnea, PND, swelling in lower extremities, anasarca,  dizziness, palpitations Resp: No-   shortness of breath with exertion or at rest.              No-   productive cough,   non-productive cough,  No- coughing up of blood.              No-   change in color of mucus.  No- wheezing.   Skin: No-   rash or lesions. GI:    heartburn, indigestion,no- abdominal pain, nausea, vomiting,  GU:  MS:  No-   joint pain or swelling.   Neuro-     nothing unusual Psych:  No- change in mood or affect. No depression or anxiety.  No memory loss.  OBJ- Physical Exam General- Alert, Oriented, Affect-appropriate, Distress- none acute, trim Skin- rash-none, lesions- none, excoriation- none Lymphadenopathy- none Head- atraumatic            Eyes- Gross vision intact, PERRLA, conjunctivae and secretions clear            Ears- Hearing, canals-normal-specifically left ear and TM look normal            Nose- turbinate edema, no-Septal dev, mucus, polyps, erosion, perforation             Throat- Mallampati II , mucosa clear , drainage- none, tonsils- atrophic Neck- flexible , trachea midline, no stridor , thyroid nl, carotid no bruit Chest - symmetrical excursion , unlabored           Heart/CV- RRR , no murmur ,  no gallop  , no rub, nl s1 s2                           - JVD- none , edema- none, stasis changes- none, varices- none           Lung- clear to P&A, wheeze- none, cough- none , dullness-none, rub- none           Chest wall-  Abd-  Br/ Gen/ Rectal- Not done, not indicated Extrem- cyanosis- none, clubbing, none, atrophy- none, strength- nl Neuro- grossly intact to observation

## 2015-03-17 NOTE — Assessment & Plan Note (Signed)
Intermittent silent reflux could cause throat irritation, especially if it happens at night and is not recognized. Plan-emphasize reflux precautions

## 2015-03-17 NOTE — Assessment & Plan Note (Signed)
She feels reasonably well controlled now. Consider trial of Nasalcrom. Throat is not red on exam at this visit. I suspect if she is hearing her heartbeat in the left ear, there may be some mild eustachian dysfunction on that side. If it persists then recommend she see ENT.

## 2015-04-13 ENCOUNTER — Other Ambulatory Visit: Payer: Self-pay | Admitting: Otolaryngology

## 2015-04-13 DIAGNOSIS — H93A2 Pulsatile tinnitus, left ear: Secondary | ICD-10-CM

## 2015-04-13 DIAGNOSIS — H6983 Other specified disorders of Eustachian tube, bilateral: Secondary | ICD-10-CM

## 2015-04-13 DIAGNOSIS — H905 Unspecified sensorineural hearing loss: Secondary | ICD-10-CM

## 2015-04-18 ENCOUNTER — Ambulatory Visit: Payer: Medicare Other | Admitting: Obstetrics and Gynecology

## 2015-04-20 ENCOUNTER — Encounter: Payer: Self-pay | Admitting: Obstetrics and Gynecology

## 2015-04-20 ENCOUNTER — Ambulatory Visit (INDEPENDENT_AMBULATORY_CARE_PROVIDER_SITE_OTHER): Payer: Medicare Other | Admitting: Obstetrics and Gynecology

## 2015-04-20 VITALS — BP 142/78 | HR 80 | Resp 16 | Ht 65.0 in | Wt 140.4 lb

## 2015-04-20 DIAGNOSIS — N3281 Overactive bladder: Secondary | ICD-10-CM | POA: Diagnosis not present

## 2015-04-20 DIAGNOSIS — M8580 Other specified disorders of bone density and structure, unspecified site: Secondary | ICD-10-CM

## 2015-04-20 DIAGNOSIS — Z01419 Encounter for gynecological examination (general) (routine) without abnormal findings: Secondary | ICD-10-CM

## 2015-04-20 MED ORDER — ESTROGENS, CONJUGATED 0.625 MG/GM VA CREA: TOPICAL_CREAM | VAGINAL | Status: DC

## 2015-04-20 NOTE — Patient Instructions (Signed)

## 2015-04-20 NOTE — Progress Notes (Signed)
Patient ID: Mary Malone, female   DOB: [birth year], 69 y.o.   MRN: [id number] 69 y.o. G0P0 Married Caucasian female here for annual exam.    Having a brain MRI - open.  Dr. Janace Hoard ordering - ENT.  Has been able to hear her heart beat in her left ear and having some hearing loss in the right ear.  Ruling out an aneurysm. No dizziness.   No vaginal bleeding or spotting.  Stop HRT in March 2016.  Still using the local vaginal estrogen cream twice a week for UTI prevention.  Does not need refill.   Taking Myrbetriq for OAB. Up at night 2 - 3 times a night.   This is much better than the Vesicare.   [facility]   PCP:   Dr. Vertell Novak  Patient's last menstrual period was 09/03/1996.          Sexually active: No. female The current method of family planning is post menopausal status.    Exercising: No.   Smoker:  no  Health Maintenance: Pap:  04-09-14 Ascus:Neg HR HPV History of abnormal Pap:  Yes, ?HPV 2002 MMG:  02-04-15 Density Cat.C/Neg:[facility] Colonoscopy:  06-16-14 normal with Dr. Collene Mares. Next due 06/2024. BMD:   04-19-14   Result  Osteopenia hip/spine:[facility] TDaP:  03/2013 Screening Labs:  Hb today: PCP, Urine today: PCP   reports that she has never smoked. She has never used smokeless tobacco. She reports that she drinks about 0.6 oz of alcohol per week. She reports that she does not use illicit drugs.  Past Medical History  Diagnosis Date  . ALLERGIC RHINITIS   . DEPRESSION   . GERD   . HYPERLIPIDEMIA     diet control efforts  . MIGRAINE HEADACHE   . URINARY INCONTINENCE     OAB  . Dense breast   . Fibroid   . PMB (postmenopausal bleeding) 12/2001    polyps  . Osteopenia 02/2006    Past Surgical History  Procedure Laterality Date  . Left knee surgey    . Dilation and curettage of uterus  2003  . Cataract extraction Bilateral 07/2012, 09/2012  . Wisdom tooth extraction    . Hysteroscopy  01/2002    resection, sm fibroid     Current Outpatient Prescriptions  Medication Sig Dispense Refill  . azelastine (ASTELIN) 0.1 % nasal spray 1-2 sprays each nostril once or twice daily as directed 30 mL 12  . Calcium Citrate-Vitamin D (CITRACAL + D PO) Take 1,200 mg by mouth daily.    . Cholecalciferol (VITAMIN D3) 2000 UNITS capsule Take 2,000 Units by mouth daily.    Marland Kitchen conjugated estrogens (PREMARIN) vaginal cream Use 1/2 g vaginally twice weekly 60 g 0  . famotidine (PEPCID AC) 10 MG chewable tablet Chew 10 mg by mouth 2 (two) times daily.    . mirabegron ER (MYRBETRIQ) 50 MG TB24 tablet TAKE 1 TABLET BY MOUTH DAILY 90 tablet 3  . Multiple Vitamins-Minerals (CENTRUM SILVER) tablet Take 1 tablet by mouth daily.    . Omega-3 Fatty Acids (FISH OIL) 1200 MG CAPS Take by mouth daily.    Marland Kitchen omeprazole (PRILOSEC OTC) 20 MG tablet Take 20 mg by mouth as needed.    . Psyllium (METAMUCIL PO) Take by mouth as needed.    . triamcinolone (NASACORT ALLERGY 24HR) 55 MCG/ACT AERO nasal inhaler Place 1 spray into the nose daily.    . TURMERIC PO Take 1 capsule  by mouth daily.    . vitamin E 400 UNIT capsule Take 400 Units by mouth daily.     No current facility-administered medications for this visit.    Family History  Problem Relation Age of Onset  . Alzheimer's disease Mother   . Allergies Mother   . Breast cancer Mother 110  . Lung cancer Father 6  . Allergies Brother   . Diabetes Brother 60  . Alzheimer's disease Brother 38    early onset  . Transient ischemic attack Maternal Grandmother   . Peripheral Artery Disease Maternal Grandmother     s/p B leg amputation  . Sudden death Paternal Grandmother 81    unknown    ROS:  Pertinent items are noted in HPI.  Otherwise, a comprehensive ROS was negative.  Exam:   BP 142/78 mmHg  Pulse 80  Resp 16  Ht 5\' 5"  (1.651 m)  Wt 140 lb 6.4 oz (63.685 kg)  BMI 23.36 kg/m2  LMP 09/03/1996    General appearance: alert, cooperative and appears stated age Head: Normocephalic,  without obvious abnormality, atraumatic Neck: no adenopathy, supple, symmetrical, trachea midline and thyroid normal to inspection and palpation Lungs: clear to auscultation bilaterally Breasts: normal appearance, no masses or tenderness, Inspection negative, No nipple retraction or dimpling, No nipple discharge or bleeding, No axillary or supraclavicular adenopathy Heart: regular rate and rhythm Abdomen: soft, non-tender; bowel sounds normal; no masses,  no organomegaly Extremities: extremities normal, atraumatic, no cyanosis or edema Skin: Skin color, texture, turgor normal. No rashes or lesions Lymph nodes: Cervical, supraclavicular, and axillary nodes normal. No abnormal inguinal nodes palpated Neurologic: Grossly normal  Pelvic: External genitalia:  no lesions.  Labia fused over clitoris.                 Urethra:  normal appearing urethra with no masses, tenderness or lesions              Bartholins and Skenes: normal                 Vagina: normal appearing vagina with normal color and discharge, no lesions.  Adolescent speculum use.               Cervix: no lesions              Pap taken: No. Bimanual Exam:  Uterus:  normal size, contour, position, consistency, mobility, non-tender  First degree uterine prolapse.               Adnexa: normal adnexa and no mass, fullness, tenderness              Rectovaginal: Yes.  .  Confirms.              Anus:  normal sphincter tone, no lesions  Chaperone was present for exam.  Assessment:   Well woman visit with normal exam. Overactive bladder.   Myrbetriq per PCP.  Slightly elevated BP. Osteopenia. 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   Plan: Yearly mammogram recommended after age 58.  Recommended self breast exam.  Pap and HR HPV as above. Discussed Calcium, Vitamin D, regular exercise program including cardiovascular and weight bearing exercise. Labs performed.  No..   See orders. Refills given on medications.  No..  See  orders.  Ok to continue vaginal estrogen cream 1/2 gram pv and urethral region twice weekly.  Discussed risks of DVT, PE, MI, stroke, breast CA. Bone density in 2017.  Follow up annually  and prn.   Additional counseling given regarding OAB.  Discussed elevated blood pressure as possible side effect of Myrbetriq. Discussed potential physical therapy through [facility] or treatment with PTNS or Botox therapy.  She will consider but wait for her MRI of brain to be completed.  After visit summary provided.

## 2015-04-27 ENCOUNTER — Other Ambulatory Visit: Payer: Medicare Other

## 2015-05-12 ENCOUNTER — Encounter: Payer: Self-pay | Admitting: Internal Medicine

## 2015-05-12 ENCOUNTER — Ambulatory Visit (INDEPENDENT_AMBULATORY_CARE_PROVIDER_SITE_OTHER): Payer: Medicare Other | Admitting: Internal Medicine

## 2015-05-12 ENCOUNTER — Other Ambulatory Visit: Payer: Medicare Other

## 2015-05-12 VITALS — BP 140/62 | HR 73 | Ht 65.0 in | Wt 141.8 lb

## 2015-05-12 DIAGNOSIS — J309 Allergic rhinitis, unspecified: Secondary | ICD-10-CM

## 2015-05-12 DIAGNOSIS — J3089 Other allergic rhinitis: Secondary | ICD-10-CM

## 2015-05-12 DIAGNOSIS — J302 Other seasonal allergic rhinitis: Secondary | ICD-10-CM

## 2015-05-12 MED ORDER — MONTELUKAST SODIUM 10 MG PO TABS
10.0000 mg | ORAL_TABLET | Freq: Every day | ORAL | Status: DC
Start: 2015-05-12 — End: 2016-05-08

## 2015-05-12 NOTE — Patient Instructions (Addendum)
Order- lab- Allergy profile     Allergic rhinitis  Try - Sudafed from the pharmacy - try taking each morning to avoid worsening insomnia. See if it helps the pressure sensation  Neb neo nasal  Script for Singulair/ montelukast  Sent     Try one daily for a month  Try otc nasalcrom/ cromolyn/ cromol nasal spray instead of what you are using now, for comparison.   Keep the appointment for June, 2017

## 2015-05-12 NOTE — Progress Notes (Signed)
12/28/11- 64 yoF never smoker (widow- husb died of lung Ca), followed for allergic rhinitis, complicated by GERD. And nasal congestion, perennial, worse lying down. Sometimes makes her get up. Fluid feeling in the ears without popping. Ears itch. Pollen is making her sneeze and cough without wheeze. Postnasal drip varies. Denies headache, purulent discharge or fever. Taking occasional Claritin-D and Nasonex does help.  01/22/13- 26 yoF never smoker (widow- husb died of lung Ca), followed for allergic rhinitis, complicated by GERD. FOLLOWS FOR: throat feels fuller than usual when outside(feels like a pollen collection) and having dizzy spells from ears (both). Almost feels like there is fluid in the ears. REFILLS for 90 day supply for Nasonex and Astepro sent to pharmacy on file. Pollen season only caused throat irritation this year. Some pressure in her ears made her feel unsteady. Today she feels well. Likes Nasonex. Denies cough. Aware of chronic GERD. Takes Prilosec every other day as a compromise to minimize bone loss.  02/15/14- 21 yoF never smoker (widow- husb died of lung Ca), followed for allergic rhinitis, complicated by GERD. FOLLOWS FOR: PND-white in color-happens every day and getting stuck in her throat. Using Nasonex and Astepro at night. Alternates Pepcid with Prilosec but does not feel reflux.  02/17/15- 10 yoF never smoker (widow- husb died of lung Ca), followed for allergic rhinitis, complicated by GERD. Follows For: Pt c/o ear and throat inflammation. Pt no longer taking nasonex but switched to nasacort.  Nasacort plus Astelin usually helps. Hearing her heartbeat in the left ear. Not tinnitus. Primary physician told her throat was red. She denies reflux symptoms.  05/12/15-68 yoF never smoker (widow- husb died of lung Ca), followed for allergic rhinitis, complicated by GERD. Acute: FOLLOWS FOR: pt c/o not being able to hear , ears are clogged, posnasal drip is bad. pt states shes been  like this since the end of july. She declines flu vaccine [place]/ ENT for nerve loss hearing deficit. He questioned allergy component to her hearing problems but also conducted/nerve damage. She is on prednisone 20 mg daily for one more day. Complains of pressure in the right ear-won't pop. Left ear occasionally pops. Chest is comfortable. Using Nasacort and Astelin nasal sprays.  ROS-see HPI Constitutional:   No-   weight loss, night sweats, fevers, chills, fatigue, lassitude. HEENT:   No-  headaches, difficulty swallowing, tooth/dental problems, sore throat,        sneezing, itching, +ear ache, nasal congestion, +post nasal drip,  CV:  No-   chest pain, orthopnea, PND, swelling in lower extremities, anasarca,  dizziness, palpitations Resp: No-   shortness of breath with exertion or at rest.              No-   productive cough,   non-productive cough,  No- coughing up of blood.              No-   change in color of mucus.  No- wheezing.   Skin: No-   rash or lesions. GI:    heartburn, indigestion,no- abdominal pain, nausea, vomiting,  GU:  MS:  No-   joint pain or swelling.   Neuro-     nothing unusual Psych:  No- change in mood or affect. No depression or anxiety.  No memory loss.  OBJ- Physical Exam General- Alert, Oriented, Affect-appropriate, Distress- none acute, trim Skin- rash-none, lesions- none, excoriation- none Lymphadenopathy- none Head- atraumatic            Eyes- Gross vision intact, PERRLA,  conjunctivae and secretions clear            Ears- , + I do not see retraction of drums            Nose- turbinate edema, no-Septal dev, mucus, polyps, erosion, perforation             Throat- Mallampati II , mucosa clear , drainage- none, tonsils- atrophic Neck- flexible , trachea midline, no stridor , thyroid nl, carotid no bruit Chest - symmetrical excursion , unlabored           Heart/CV- RRR , no murmur , no gallop  , no rub, nl s1 s2                           - JVD- none , edema-  none, stasis changes- none, varices- none           Lung- clear to P&A, wheeze- none, cough- none , dullness-none, rub- none           Chest wall-  Abd-  Br/ Gen/ Rectal- Not done, not indicated Extrem- cyanosis- none, clubbing, none, atrophy- none, strength- nl Neuro- grossly intact to observation

## 2015-05-13 LAB — ALLERGY FULL PROFILE
Allergen, D pternoyssinus,d7: <0.1 kU/L
Allergen,Goose feathers, e70: <0.1 kU/L
Alternaria Alternata: <0.1 kU/L
Aspergillus fumigatus, m3: <0.1 kU/L
Bahia Grass: <0.1 kU/L
Bermuda Grass: <0.1 kU/L
Box Elder IgE: <0.1 kU/L
Candida Albicans: <0.1 kU/L
Cat Dander: 0.34 kU/L — ABNORMAL HIGH
Common Ragweed: <0.1 kU/L
Curvularia lunata: <0.1 kU/L
D. farinae: <0.1 kU/L
Dog Dander: <0.1 kU/L
Elm IgE: <0.1 kU/L
Fescue: <0.1 kU/L
G005 Rye, Perennial: <0.1 kU/L
G009 Red Top: <0.1 kU/L
Goldenrod: <0.1 kU/L
Helminthosporium halodes: <0.1 kU/L
House Dust Hollister: <0.1 kU/L
IgE (Immunoglobulin E), Serum: 12 kU/L (ref <115)
Lamb's Quarters: <0.1 kU/L
Oak: <0.1 kU/L
Plantain: <0.1 kU/L
Stemphylium Botryosum: <0.1 kU/L
Sycamore Tree: <0.1 kU/L
Timothy Grass: <0.1 kU/L

## 2015-06-09 ENCOUNTER — Other Ambulatory Visit: Payer: Self-pay | Admitting: Otolaryngology

## 2015-06-18 NOTE — Assessment & Plan Note (Signed)
Question if her ear complaints have any association with rhinitis and eustachian dysfunction Plan-nasal nebulizer decongestant, Sudafed, Singulair, Nasalcrom, lab for allergy profile

## 2015-07-11 ENCOUNTER — Ambulatory Visit (INDEPENDENT_AMBULATORY_CARE_PROVIDER_SITE_OTHER): Payer: Medicare Other | Admitting: Neurology

## 2015-07-11 ENCOUNTER — Encounter: Payer: Self-pay | Admitting: Neurology

## 2015-07-11 VITALS — BP 150/86 | HR 91 | Ht 65.0 in | Wt 138.0 lb

## 2015-07-11 DIAGNOSIS — R413 Other amnesia: Secondary | ICD-10-CM

## 2015-07-11 DIAGNOSIS — R930 Abnormal findings on diagnostic imaging of skull and head, not elsewhere classified: Secondary | ICD-10-CM

## 2015-07-11 DIAGNOSIS — G3184 Mild cognitive impairment, so stated: Secondary | ICD-10-CM | POA: Diagnosis not present

## 2015-07-11 DIAGNOSIS — R9082 White matter disease, unspecified: Secondary | ICD-10-CM

## 2015-07-11 MED ORDER — CEREFOLIN NAC 6-2-600 MG PO TABS: 1.0000 | ORAL_TABLET | ORAL | Status: DC

## 2015-07-11 NOTE — Progress Notes (Signed)
[facility] [address] [place]. [place] [address] [phone]       OFFICE CONSULT NOTE  Mary. Mary Malone Date of Birth:  December 28, 1945 Medical Record Number:  [id number]   Referring MD:  Dr Janace Hoard  Reason for Referral:  Abnormal brain MRI and memory loss HPI: Mary Malone is a pleasant 53 year Caucasian lady who was recently found to have an abnormal MRI scan of the brain on 06/14/15 and hence referred to me for my consultation. She states her symptoms began a year ago when she initially had some pulsatile tinnitus in the left ear. She feels this may have started after dental cleaning in January. She subsequently in July the left to sudden onset of hearing loss in the right ear and she woke up 1 day visit. She was seen by ENT and workup showed some mastoid effusion. She was treated with a short course of prednisone did not help. She eventually had been up no ostomy tube inserted she had only transient improvement and subsequently she was felt that in fact hearing is "worse. She had MRI scan of the brain done on 04/21/15 which I have personally reviewed and shows mild nonspecific changes of chronic microvascular ischemia. 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 She eventually underwent CT angiogram of the brain on 06/14/15 which showed no large vessel stenosis or occlusion. CT angiogram of the neck also showed no significant extracranial stenosis. The patient and incidental finding of thyroid nodule and solitary pulmonary nodule as well. Patient denies any history of stroke in the form of sudden onset of vertigo, diplopia, extremity weakness, numbness, gait or balance problems. She has had mild short-term memory difficulties for years and feels it's not significantly worsened husband's. She calls them senior moments. She can manage her own affairs. She does however have a family history of Alzheimer's and this is a main concern the fear of developing Alzheimer's and what can be  done to prevent it. She is currently taking fish oil. She has no known history of diabetes, hypertension, hyperlipidemia or smoking.  ROS:   14 system review of systems is positive for hearing loss, ringing in the ears, feeling hot, joint pain, allergies, incontinence, anxiety, insomnia and all other systems negative  PMH:  Past Medical History  Diagnosis Date  . ALLERGIC RHINITIS   . DEPRESSION   . GERD   . HYPERLIPIDEMIA     diet control efforts  . URINARY INCONTINENCE     OAB  . Dense breast   . Fibroid   . PMB (postmenopausal bleeding) 12/2001    polyps  . Osteopenia 02/2006  . Hearing loss   . Osteoarthritis   . Wisdom teeth removed 1866/1967    Social History:  Social History   Social History  . Marital Status: Married    Spouse Name: N/A  . Number of Children: N/A  . Years of Education: N/A   Occupational History  . Not on file.   Social History Main Topics  . Smoking status: Never Smoker   . Smokeless tobacco: Never Used  . Alcohol Use: 1.2 oz/week    1 Standard drinks or equivalent, 1 Glasses of wine per week     Comment: occ glass of wine, <4x/year  . Drug Use: No  . Sexual Activity: No   Other Topics Concern  . Not on file   Social History Narrative    Medications:   Current Outpatient Prescriptions on File Prior to Visit  Medication Sig  Dispense Refill  . azelastine (ASTELIN) 0.1 % nasal spray 1-2 sprays each nostril once or twice daily as directed 30 mL 12  . Calcium Citrate-Vitamin D (CITRACAL + D PO) Take 1,200 mg by mouth daily.    . Cholecalciferol (VITAMIN D3) 2000 UNITS capsule Take 2,000 Units by mouth daily.    Marland Kitchen conjugated estrogens (PREMARIN) vaginal cream Use 1/2 g vaginally twice weekly 60 g 0  . famotidine (PEPCID AC) 10 MG chewable tablet Chew 10 mg by mouth 2 (two) times daily.    . mirabegron ER (MYRBETRIQ) 50 MG TB24 tablet TAKE 1 TABLET BY MOUTH DAILY 90 tablet 3  . montelukast (SINGULAIR) 10 MG tablet Take 1 tablet (10 mg  total) by mouth at bedtime. 30 tablet 11  . Multiple Vitamins-Minerals (CENTRUM SILVER) tablet Take 1 tablet by mouth daily.    . Omega-3 Fatty Acids (FISH OIL) 1200 MG CAPS Take by mouth daily.    Marland Kitchen omeprazole (PRILOSEC OTC) 20 MG tablet Take 20 mg by mouth as needed.    . Psyllium (METAMUCIL PO) Take by mouth as needed.    . triamcinolone (NASACORT ALLERGY 24HR) 55 MCG/ACT AERO nasal inhaler Place 1 spray into the nose daily.    . TURMERIC PO Take 1 capsule by mouth daily.    . vitamin E 400 UNIT capsule Take 800 Units by mouth daily.      No current facility-administered medications on file prior to visit.    Allergies:  No Known Allergies  Physical Exam General: well developed, well nourished anxious middle-aged Caucasian lady, seated, in no evident distress Head: head normocephalic and atraumatic.   Neck: supple with no carotid or supraclavicular bruits Cardiovascular: regular rate and rhythm, no murmurs Musculoskeletal: no deformity Skin:  no rash/petichiae Vascular:  Normal pulses all extremities  Neurologic Exam Mental Status: Awake and fully alert. Oriented to place and time. Recent and remote memory intact. Attention span, concentration and fund of knowledge appropriate. Mood and affect appropriate. Mini-Mental status exam scored 30/30. Able to name 11 four legged animals. Clock drawing 4/4. Cranial Nerves: Fundoscopic exam reveals sharp disc margins. Pupils equal, briskly reactive to light. Extraocular movements full without nystagmus. Visual fields full to confrontation. Hearing intact. Facial sensation intact. Face, tongue, palate moves normally and symmetrically.  Motor: Normal bulk and tone. Normal strength in all tested extremity muscles. Sensory.: intact to touch , pinprick , position and vibratory sensation.  Coordination: Rapid alternating movements normal in all extremities. Finger-to-nose and heel-to-shin performed accurately bilaterally. Gait and Station: Arises  from chair without difficulty. Stance is normal. Gait demonstrates normal stride length and balance . Able to heel, toe and tandem walk without difficulty.  Reflexes: 1+ and symmetric. Toes downgoing.   NIHSS  0 Modified Rankin  0   ASSESSMENT: 6 year Caucasian lady with abnormal MRI scan of the brain showing changes of small vessel disease which are likely age-related. Complains of mild short-term memory difficulties likely due to age-related mild cognitive impairment. Family history of Alzheimer's disease causing significant underlying anxiety.     PLAN: I had a long discussion with the patient and husband regarding her abnormal MRI findings suggesting chronic microvascular ischemia and her complaints of mild subjective memory loss likely representing age-related mild cognitive impairment. I discussed the risk for stroke as well as developing Alzheimer's and answered questions. I recommend she start taking aspirin 81 mg daily and check fasting lipid profile and hemoglobin A 1C. She was advised to increase perspiration in activities  for cognitively challenging tasks like solving crossword puzzle, sudoku and playing bridge. She was also encouraged to continue to take fish oil daily as well as start Cerefolin NAC 1 tablet daily. This was a prolonged consultation visit with extensive history taking, review of data, medical decision making of high complexity requiring 60+ minutes. She was asked to return for follow-up in 2 months or call earlier if necessary.  Antony Contras, MD Note: This document was prepared with digital dictation and possible smart phrase technology. Any transcriptional errors that result from this process are unintentional.

## 2015-07-11 NOTE — Patient Instructions (Signed)
I had a long discussion with the patient and husband regarding her abnormal MRI findings suggesting chronic microvascular ischemia and her complaints of mild subjective memory loss likely representing age-related mild cognitive impairment. I discussed the risk for stroke as well as developing Alzheimer's and answered questions. I recommend she start taking aspirin 81 mg daily and check fasting lipid profile and hemoglobin A 1C. She was advised to increase perspiration in activities for cognitively challenging tasks like solving crossword puzzle, sudoku and playing bridge. She was also encouraged to continue to take fish oil daily as well as start Cerefolin NAC 1 tablet daily. She was asked to return for follow-up in 2 months or call earlier if necessary. Management of Memory Problems  There are some general things you can do to help manage your memory problems.  Your memory may not in fact recover, but by using techniques and strategies you will be able to manage your memory difficulties better.  1)  Establish a routine.  Try to establish and then stick to a regular routine.  By doing this, you will get used to what to expect and you will reduce the need to rely on your memory.  Also, try to do things at the same time of day, such as taking your medication or checking your calendar first thing in the morning.  Think about think that you can do as a part of a regular routine and make a list.  Then enter them into a daily planner to remind you.  This will help you establish a routine.  2)  Organize your environment.  Organize your environment so that it is uncluttered.  Decrease visual stimulation.  Place everyday items such as keys or cell phone in the same place every day (ie.  Basket next to front door)  Use post it notes with a brief message to yourself (ie. Turn off light, lock the door)  Use labels to indicate where things go (ie. Which cupboards are for food, dishes, etc.)  Keep a notepad and  pen by the telephone to take messages  3)  Memory Aids  A diary or journal/notebook/daily planner  Making a list (shopping list, chore list, to do list that needs to be done)  Using an alarm as a reminder (kitchen timer or cell phone alarm)  Using cell phone to store information (Notes, Calendar, Reminders)  Calendar/White board placed in a prominent position  Post-it notes  In order for memory aids to be useful, you need to have good habits.  It's no good remembering to make a note in your journal if you don't remember to look in it.  Try setting aside a certain time of day to look in journal.  4)  Improving mood and managing fatigue.  There may be other factors that contribute to memory difficulties.  Factors, such as anxiety, depression and tiredness can affect memory.  Regular gentle exercise can help improve your mood and give you more energy.  Simple relaxation techniques may help relieve symptoms of anxiety  Try to get back to completing activities or hobbies you enjoyed doing in the past.  Learn to pace yourself through activities to decrease fatigue.  Find out about some local support groups where you can share experiences with others.  Try and achieve 7-8 hours of sleep at night.

## 2015-07-12 ENCOUNTER — Other Ambulatory Visit (INDEPENDENT_AMBULATORY_CARE_PROVIDER_SITE_OTHER): Payer: Self-pay

## 2015-07-12 DIAGNOSIS — Z0289 Encounter for other administrative examinations: Secondary | ICD-10-CM

## 2015-07-13 ENCOUNTER — Telehealth: Payer: Self-pay | Admitting: *Deleted

## 2015-07-13 LAB — VITAMIN B12: Vitamin B-12: 1058 pg/mL — ABNORMAL HIGH (ref 211–946)

## 2015-07-13 LAB — LIPID PANEL
Chol/HDL Ratio: 2.7 ratio units (ref 0.0–4.4)
Cholesterol, Total: 214 mg/dL — ABNORMAL HIGH (ref 100–199)
HDL: 79 mg/dL (ref >39)
LDL Calculated: 123 mg/dL — ABNORMAL HIGH (ref 0–99)
Triglycerides: 61 mg/dL (ref 0–149)
VLDL Cholesterol Cal: 12 mg/dL (ref 5–40)

## 2015-07-13 LAB — HEMOGLOBIN A1C
Est. average glucose Bld gHb Est-mCnc: 114 mg/dL
Hgb A1c MFr Bld: 5.6 % (ref 4.8–5.6)

## 2015-07-13 LAB — TSH: TSH: 4.16 u[IU]/mL (ref 0.450–4.500)

## 2015-07-13 NOTE — Telephone Encounter (Signed)
-----   Message from Garvin Fila, MD sent at 07/13/2015 10:13 AM EST ----- Kindly inform the patient that blood work for screening for diabetes was normal and cholesterol was slightly high. Advise the patient to see the primary care physician for addressing cholesterol treatment.

## 2015-07-13 NOTE — Telephone Encounter (Signed)
I spoke to pt and relayed the lab results and Dr. Clydene Fake recommendation.  I forwarded the results to her pcp, Dr. Pricilla Holm.  Pt verbalized understanding.

## 2015-08-19 ENCOUNTER — Encounter: Payer: Self-pay | Admitting: Neurology

## 2015-09-13 NOTE — Progress Notes (Signed)
Form fax to [employer] for ICD  Code. For labs.

## 2015-10-04 ENCOUNTER — Ambulatory Visit (INDEPENDENT_AMBULATORY_CARE_PROVIDER_SITE_OTHER): Payer: Medicare Other | Admitting: Neurology

## 2015-10-04 ENCOUNTER — Encounter: Payer: Self-pay | Admitting: Neurology

## 2015-10-04 VITALS — BP 134/78 | HR 82 | Ht 65.0 in | Wt 134.4 lb

## 2015-10-04 DIAGNOSIS — G3184 Mild cognitive impairment, so stated: Secondary | ICD-10-CM

## 2015-10-04 NOTE — Patient Instructions (Signed)
I had a long discussion with the patient and her husband regarding her mild cognitive impairment which appears to be stable. I again encouraged her to continue perspiration and mentally challenging activities like solving crossword puzzles, sudoku. She stated she cannot afford to take Cerefolin nac. I recommend she try Reservetarol 200 mg daily instead as well as continued taking fish oil. She was also advised to go on a low-fat diet due to her mild hyperlipidemia. We also discussed memory compensation strategies. She will return for follow-up in 6 months with Gilford Raid, nurse practitioner call earlier if necessary. Memory Compensation Strategies  1. Use "WARM" strategy.  W= write it down  A= associate it  R= repeat it  M= make a mental note  2.   You can keep a Social worker.  Use a 3-ring notebook with sections for the following: calendar, important names and phone numbers,  medications, doctors' names/phone numbers, lists/reminders, and a section to journal what you did  each day.   3.    Use a calendar to write appointments down.  4.    Write yourself a schedule for the day.  This can be placed on the calendar or in a separate section of the Memory Notebook.  Keeping a  regular schedule can help memory.  5.    Use medication organizer with sections for each day or morning/evening pills.  You may need help loading it  6.    Keep a basket, or pegboard by the door.  Place items that you need to take out with you in the basket or on the pegboard.  You may also want to  include a message board for reminders.  7.    Use sticky notes.  Place sticky notes with reminders in a place where the task is performed.  For example: " turn off the  stove" placed by the stove, "lock the door" placed on the door at eye level, " take your medications" on  the bathroom mirror or by the place where you normally take your medications.  8.    Use alarms/timers.  Use while cooking to remind yourself to  check on food or as a reminder to take your medicine, or as a  reminder to make a call, or as a reminder to perform another task, etc.

## 2015-10-04 NOTE — Progress Notes (Signed)
[facility] [address] [place]. [place] [address] [phone]       OFFICE FOLLOW UP VISIT NOTE  Mary. Mary Malone Date of Birth:  05/25/1946 Medical Record Number:  [id number]   Referring MD:  Dr Janace Hoard  Reason for Referral:  Abnormal brain MRI and memory loss HPI: Mary Malone is a pleasant 88 year Caucasian lady who was recently found to have an abnormal MRI scan of Mary brain on 06/14/15 and hence referred to me for my consultation. She states Mary symptoms began a year ago when she initially had some pulsatile tinnitus in Mary left ear. She feels this may have started after dental cleaning in January. She subsequently in July Mary left to sudden onset of hearing loss in Mary right ear and she woke up 1 day visit. She was seen by ENT and workup showed some mastoid effusion. She was treated with a short course of prednisone did not help. She eventually had been up no ostomy tube inserted she had only transient improvement and subsequently she was felt that in fact hearing is "worse. She had MRI scan of Mary brain done on 04/21/15 which I have personally reviewed and shows mild nonspecific changes of chronic microvascular ischemia. 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 She eventually underwent CT angiogram of Mary brain on 06/14/15 which showed no large vessel stenosis or occlusion. CT angiogram of Mary neck also showed no significant extracranial stenosis. Mary Malone and incidental finding of thyroid nodule and solitary pulmonary nodule as well. Malone denies any history of stroke in Mary form of sudden onset of vertigo, diplopia, extremity weakness, numbness, gait or balance problems. She has had mild short-term memory difficulties for years and feels it's not significantly worsened Malone's. She calls them senior moments. She can manage Mary own affairs. She does however have a family history of Alzheimer's and this is a main concern Mary fear of developing Alzheimer's and what  can be done to prevent it. She is currently taking fish oil. She has no known history of diabetes, hypertension, hyperlipidemia or smoking. Update 10/04/2015 : She returns for follow-up after last visit 2 months ago. She states Mary memory difficulties unchanged. She has an upcoming appointment with ENT physician to discuss Mary progressively decreased hearing. She did not fill Mary prescription for Cerefolin and she found it to be too expensive. She had lab work done on 07/12/15 which showed hemoglobin A1c of 5.6 and total cholesterol 214, triglycerides 61, HDL 79 and LDL 123 mg percent. She has started doing some cognitively challenging activities and has signed up for [email or web address]. She's been taking fish oil daily. She has no new complaints today. ROS:   14 system review of systems is positive for hearing loss, ringing in Mary ears,  flushing, allergies, incontinence of bladder, frequency of urination, urgency, joint pain, frequent waking and all other systems negative  PMH:  Past Medical History  Diagnosis Date  . ALLERGIC RHINITIS   . DEPRESSION   . GERD   . HYPERLIPIDEMIA     diet control efforts  . URINARY INCONTINENCE     OAB  . Dense breast   . Fibroid   . PMB (postmenopausal bleeding) 12/2001    polyps  . Osteopenia 02/2006  . Hearing loss   . Osteoarthritis   . Wisdom teeth removed 1866/1967    Social History:  Social History   Social History  . Marital Status: Married    Spouse Name: N/A  . Number of  Children: N/A  . Years of Education: N/A   Occupational History  . Not on file.   Social History Main Topics  . Smoking status: Never Smoker   . Smokeless tobacco: Never Used  . Alcohol Use: 1.2 oz/week    1 Standard drinks or equivalent, 1 Glasses of wine per week     Comment: occ glass of wine, <4x/year  . Drug Use: No  . Sexual Activity: No   Other Topics Concern  . Not on file   Social History Narrative    Medications:   Current Outpatient Prescriptions on  File Prior to Visit  Medication Sig Dispense Refill  . Calcium Citrate-Vitamin D (CITRACAL + D PO) Take 1,200 mg by mouth daily.    . Cholecalciferol (VITAMIN D3) 2000 UNITS capsule Take 2,000 Units by mouth daily.    Marland Kitchen conjugated estrogens (PREMARIN) vaginal cream Use 1/2 g vaginally twice weekly 60 g 0  . cromolyn (NASALCROM) 5.2 MG/ACT nasal spray Place 1 spray into both nostrils 2 (two) times daily.    . famotidine (PEPCID AC) 10 MG chewable tablet Chew 10 mg by mouth 2 (two) times daily.    Marland Kitchen FLUZONE HIGH-DOSE 0.5 ML SUSY ADM 0.5ML IM UTD  0  . mirabegron ER (MYRBETRIQ) 50 MG TB24 tablet TAKE 1 TABLET BY MOUTH DAILY 90 tablet 3  . montelukast (SINGULAIR) 10 MG tablet Take 1 tablet (10 mg total) by mouth at bedtime. 30 tablet 11  . Multiple Vitamins-Minerals (CENTRUM SILVER) tablet Take 1 tablet by mouth daily.    . Omega-3 Fatty Acids (FISH OIL) 1200 MG CAPS Take by mouth daily.    . pseudoephedrine (SUDAFED) 120 MG 12 hr tablet Take 120 mg by mouth 2 (two) times daily.    . Psyllium (METAMUCIL PO) Take by mouth as needed.    . vitamin E 400 UNIT capsule Take 800 Units by mouth daily.      No current facility-administered medications on file prior to visit.    Allergies:  No Known Allergies  Physical Exam General: well developed, well nourished anxious middle-aged Caucasian lady, seated, in no evident distress Head: head normocephalic and atraumatic.   Neck: supple with no carotid or supraclavicular bruits Cardiovascular: regular rate and rhythm, no murmurs Musculoskeletal: no deformity Skin:  no rash/petichiae Vascular:  Normal pulses all extremities  Neurologic Exam Mental Status: Awake and fully alert. Oriented to place and time. Recent and remote memory intact. Attention span, concentration and fund of knowledge appropriate. Mood and affect appropriate. Mini-Mental status exam not done Cranial Nerves: Fundoscopic exam reveals sharp disc margins. Pupils equal, briskly reactive  to light. Extraocular movements full without nystagmus. Visual fields full to confrontation. Hearing intact. Facial sensation intact. Face, tongue, palate moves normally and symmetrically.  Motor: Normal bulk and tone. Normal strength in all tested extremity muscles. Sensory.: intact to touch , pinprick , position and vibratory sensation.  Coordination: Rapid alternating movements normal in all extremities. Finger-to-nose and heel-to-shin performed accurately bilaterally. Gait and Station: Arises from chair without difficulty. Stance is normal. Gait demonstrates normal stride length and balance . Able to heel, toe and tandem walk without difficulty.  Reflexes: 1+ and symmetric. Toes downgoing.       ASSESSMENT: 41 year Caucasian lady with abnormal MRI scan of Mary brain showing changes of small vessel disease which are likely age-related. Complaints of mild short-term memory difficulties likely due to age-related mild cognitive impairment. Family history of Alzheimer's disease causing significant underlying anxiety.  PLAN: I had a long discussion with Mary Malone and Mary Malone regarding Mary mild cognitive impairment which appears to be stable. I again encouraged Mary to continue perspiration and mentally challenging activities like solving crossword puzzles, sudoku. She stated she cannot afford to take Cerefolin nac. I recommend she try Reservetarol 200 mg daily instead as well as continued taking fish oil. She was also advised to go on a low-fat diet due to Mary mild hyperlipidemia. We also discussed memory compensation strategies. She will return for follow-up in 6 months with Gilford Raid, nurse practitioner call earlier if necessary.   Antony Contras, MD Note: This document was prepared with digital dictation and possible smart phrase technology. Any transcriptional errors that result from this process are unintentional.

## 2015-11-24 ENCOUNTER — Encounter: Payer: Self-pay | Admitting: Internal Medicine

## 2015-11-24 DIAGNOSIS — E041 Nontoxic single thyroid nodule: Secondary | ICD-10-CM

## 2015-12-01 ENCOUNTER — Ambulatory Visit
Admission: RE | Admit: 2015-12-01 | Discharge: 2015-12-01 | Disposition: A | Discharge status: Home / Self Care | Payer: Medicare Other | Source: Ambulatory Visit | Attending: Internal Medicine | Admitting: Internal Medicine

## 2015-12-01 DIAGNOSIS — E041 Nontoxic single thyroid nodule: Secondary | ICD-10-CM

## 2016-01-03 ENCOUNTER — Other Ambulatory Visit: Payer: Self-pay

## 2016-01-03 DIAGNOSIS — Z1231 Encounter for screening mammogram for malignant neoplasm of breast: Secondary | ICD-10-CM

## 2016-02-02 HISTORY — PX: REFRACTIVE SURGERY: SHX103

## 2016-02-07 ENCOUNTER — Ambulatory Visit
Admission: RE | Admit: 2016-02-07 | Discharge: 2016-02-07 | Disposition: A | Discharge status: Home / Self Care | Payer: Medicare Other | Source: Ambulatory Visit

## 2016-02-07 DIAGNOSIS — Z1231 Encounter for screening mammogram for malignant neoplasm of breast: Secondary | ICD-10-CM

## 2016-02-13 ENCOUNTER — Ambulatory Visit (INDEPENDENT_AMBULATORY_CARE_PROVIDER_SITE_OTHER): Payer: Medicare Other | Admitting: Internal Medicine

## 2016-02-13 ENCOUNTER — Other Ambulatory Visit (INDEPENDENT_AMBULATORY_CARE_PROVIDER_SITE_OTHER): Payer: Medicare Other

## 2016-02-13 ENCOUNTER — Encounter: Payer: Self-pay | Admitting: Internal Medicine

## 2016-02-13 VITALS — BP 122/84 | HR 92 | Temp 98.2°F | Resp 12 | Ht 65.0 in | Wt 126.0 lb

## 2016-02-13 DIAGNOSIS — E041 Nontoxic single thyroid nodule: Secondary | ICD-10-CM | POA: Diagnosis not present

## 2016-02-13 DIAGNOSIS — Z0000 Encounter for general adult medical examination without abnormal findings: Secondary | ICD-10-CM

## 2016-02-13 DIAGNOSIS — N3281 Overactive bladder: Secondary | ICD-10-CM | POA: Diagnosis not present

## 2016-02-13 DIAGNOSIS — Z1159 Encounter for screening for other viral diseases: Secondary | ICD-10-CM

## 2016-02-13 LAB — COMPREHENSIVE METABOLIC PANEL
ALT: 14 U/L (ref 0–35)
AST: 17 U/L (ref 0–37)
Albumin: 4.4 g/dL (ref 3.5–5.2)
Alkaline Phosphatase: 51 U/L (ref 39–117)
BUN: 17 mg/dL (ref 6–23)
CO2: 28 mEq/L (ref 19–32)
Calcium: 10.2 mg/dL (ref 8.4–10.5)
Chloride: 105 mEq/L (ref 96–112)
Creatinine, Ser: 0.82 mg/dL (ref 0.40–1.20)
GFR: 73.47 mL/min (ref >60.00)
Glucose, Bld: 91 mg/dL (ref 70–99)
Potassium: 3.9 mEq/L (ref 3.5–5.1)
Sodium: 141 mEq/L (ref 135–145)
Total Bilirubin: 0.6 mg/dL (ref 0.2–1.2)
Total Protein: 7.4 g/dL (ref 6.0–8.3)

## 2016-02-13 LAB — CBC
HCT: 39.8 % (ref 36.0–46.0)
Hemoglobin: 13.6 g/dL (ref 12.0–15.0)
MCHC: 34.1 g/dL (ref 30.0–36.0)
MCV: 91.6 fl (ref 78.0–100.0)
Platelets: 253 10*3/uL (ref 150.0–400.0)
RBC: 4.35 Mil/uL (ref 3.87–5.11)
RDW: 12.7 % (ref 11.5–15.5)
WBC: 6.8 10*3/uL (ref 4.0–10.5)

## 2016-02-13 LAB — TSH: TSH: 3.74 u[IU]/mL (ref 0.35–4.50)

## 2016-02-13 LAB — VITAMIN B12: Vitamin B-12: >1500 pg/mL — ABNORMAL HIGH (ref 211–911)

## 2016-02-13 LAB — T4, FREE: Free T4: 0.85 ng/dL (ref 0.60–1.60)

## 2016-02-13 MED ORDER — MIRABEGRON ER 50 MG PO TB24: ORAL_TABLET | ORAL | Status: DC

## 2016-02-13 NOTE — Patient Instructions (Signed)
We will check the blood work today and send the results on mychart.   Keep up the good work with the health and consider adding some mild exercise 1-2 times per week.   Health Maintenance, Female Adopting a healthy lifestyle and getting preventive care can go a long way to promote health and wellness. Talk with your health care provider about what schedule of regular examinations is right for you. This is a good chance for you to check in with your provider about disease prevention and staying healthy. In between checkups, there are plenty of things you can do on your own. Experts have done a lot of research about which lifestyle changes and preventive measures are most likely to keep you healthy. Ask your health care provider for more information. WEIGHT AND DIET  Eat a healthy diet  Be sure to include plenty of vegetables, fruits, low-fat dairy products, and lean protein.  Do not eat a lot of foods high in solid fats, added sugars, or salt.  Get regular exercise. This is one of the most important things you can do for your health.  Most adults should exercise for at least 150 minutes each week. The exercise should increase your heart rate and make you sweat (moderate-intensity exercise).  Most adults should also do strengthening exercises at least twice a week. This is in addition to the moderate-intensity exercise.  Maintain a healthy weight  Body mass index (BMI) is a measurement that can be used to identify possible weight problems. It estimates body fat based on height and weight. Your health care provider can help determine your BMI and help you achieve or maintain a healthy weight.  For females 60 years of age and older:   A BMI below 18.5 is considered underweight.  A BMI of 18.5 to 24.9 is normal.  A BMI of 25 to 29.9 is considered overweight.  A BMI of 30 and above is considered obese.  Watch levels of cholesterol and blood lipids  You should start having your blood  tested for lipids and cholesterol at 69 years of age, then have this test every 5 years.  You may need to have your cholesterol levels checked more often if:  Your lipid or cholesterol levels are high.  You are older than 69 years of age.  You are at high risk for heart disease.  CANCER SCREENING   Lung Cancer  Lung cancer screening is recommended for adults 60-24 years old who are at high risk for lung cancer because of a history of smoking.  A yearly low-dose CT scan of the lungs is recommended for people who:  Currently smoke.  Have quit within the past 15 years.  Have at least a 30-pack-year history of smoking. A pack year is smoking an average of one pack of cigarettes a day for 1 year.  Yearly screening should continue until it has been 15 years since you quit.  Yearly screening should stop if you develop a health problem that would prevent you from having lung cancer treatment.  Breast Cancer  Practice breast self-awareness. This means understanding how your breasts normally appear and feel.  It also means doing regular breast self-exams. Let your health care provider know about any changes, no matter how small.  If you are in your 20s or 30s, you should have a clinical breast exam (CBE) by a health care provider every 1-3 years as part of a regular health exam.  If you are 40 or older, have  a CBE every year. Also consider having a breast X-ray (mammogram) every year.  If you have a family history of breast cancer, talk to your health care provider about genetic screening.  If you are at high risk for breast cancer, talk to your health care provider about having an MRI and a mammogram every year.  Breast cancer gene (BRCA) assessment is recommended for women who have family members with BRCA-related cancers. BRCA-related cancers include:  Breast.  Ovarian.  Tubal.  Peritoneal cancers.  Results of the assessment will determine the need for genetic counseling  and BRCA1 and BRCA2 testing. Cervical Cancer Your health care provider may recommend that you be screened regularly for cancer of the pelvic organs (ovaries, uterus, and vagina). This screening involves a pelvic examination, including checking for microscopic changes to the surface of your cervix (Pap test). You may be encouraged to have this screening done every 3 years, beginning at age 21.  For women ages 30-65, health care providers may recommend pelvic exams and Pap testing every 3 years, or they may recommend the Pap and pelvic exam, combined with testing for human papilloma virus (HPV), every 5 years. Some types of HPV increase your risk of cervical cancer. Testing for HPV may also be done on women of any age with unclear Pap test results.  Other health care providers may not recommend any screening for nonpregnant women who are considered low risk for pelvic cancer and who do not have symptoms. Ask your health care provider if a screening pelvic exam is right for you.  If you have had past treatment for cervical cancer or a condition that could lead to cancer, you need Pap tests and screening for cancer for at least 20 years after your treatment. If Pap tests have been discontinued, your risk factors (such as having a new sexual partner) need to be reassessed to determine if screening should resume. Some women have medical problems that increase the chance of getting cervical cancer. In these cases, your health care provider may recommend more frequent screening and Pap tests. Colorectal Cancer  This type of cancer can be detected and often prevented.  Routine colorectal cancer screening usually begins at 69 years of age and continues through 69 years of age.  Your health care provider may recommend screening at an earlier age if you have risk factors for colon cancer.  Your health care provider may also recommend using home test kits to check for hidden blood in the stool.  A small camera  at the end of a tube can be used to examine your colon directly (sigmoidoscopy or colonoscopy). This is done to check for the earliest forms of colorectal cancer.  Routine screening usually begins at age 50.  Direct examination of the colon should be repeated every 5-10 years through 69 years of age. However, you may need to be screened more often if early forms of precancerous polyps or small growths are found. Skin Cancer  Check your skin from head to toe regularly.  Tell your health care provider about any new moles or changes in moles, especially if there is a change in a mole's shape or color.  Also tell your health care provider if you have a mole that is larger than the size of a pencil eraser.  Always use sunscreen. Apply sunscreen liberally and repeatedly throughout the day.  Protect yourself by wearing long sleeves, pants, a wide-brimmed hat, and sunglasses whenever you are outside. HEART DISEASE, DIABETES, AND HIGH   BLOOD PRESSURE   High blood pressure causes heart disease and increases the risk of stroke. High blood pressure is more likely to develop in:  People who have blood pressure in the high end of the normal range (130-139/85-89 mm Hg).  People who are overweight or obese.  People who are African American.  If you are 18-39 years of age, have your blood pressure checked every 3-5 years. If you are 40 years of age or older, have your blood pressure checked every year. You should have your blood pressure measured twice--once when you are at a hospital or clinic, and once when you are not at a hospital or clinic. Record the average of the two measurements. To check your blood pressure when you are not at a hospital or clinic, you can use:  An automated blood pressure machine at a pharmacy.  A home blood pressure monitor.  If you are between 55 years and 79 years old, ask your health care provider if you should take aspirin to prevent strokes.  Have regular diabetes  screenings. This involves taking a blood sample to check your fasting blood sugar level.  If you are at a normal weight and have a low risk for diabetes, have this test once every three years after 69 years of age.  If you are overweight and have a high risk for diabetes, consider being tested at a younger age or more often. PREVENTING INFECTION  Hepatitis B  If you have a higher risk for hepatitis B, you should be screened for this virus. You are considered at high risk for hepatitis B if:  You were born in a country where hepatitis B is common. Ask your health care provider which countries are considered high risk.  Your parents were born in a high-risk country, and you have not been immunized against hepatitis B (hepatitis B vaccine).  You have HIV or AIDS.  You use needles to inject street drugs.  You live with someone who has hepatitis B.  You have had sex with someone who has hepatitis B.  You get hemodialysis treatment.  You take certain medicines for conditions, including cancer, organ transplantation, and autoimmune conditions. Hepatitis C  Blood testing is recommended for:  Everyone born from 1945 through 1965.  Anyone with known risk factors for hepatitis C. Sexually transmitted infections (STIs)  You should be screened for sexually transmitted infections (STIs) including gonorrhea and chlamydia if:  You are sexually active and are younger than 69 years of age.  You are older than 69 years of age and your health care provider tells you that you are at risk for this type of infection.  Your sexual activity has changed since you were last screened and you are at an increased risk for chlamydia or gonorrhea. Ask your health care provider if you are at risk.  If you do not have HIV, but are at risk, it may be recommended that you take a prescription medicine daily to prevent HIV infection. This is called pre-exposure prophylaxis (PrEP). You are considered at risk  if:  You are sexually active and do not regularly use condoms or know the HIV status of your partner(s).  You take drugs by injection.  You are sexually active with a partner who has HIV. Talk with your health care provider about whether you are at high risk of being infected with HIV. If you choose to begin PrEP, you should first be tested for HIV. You should then be tested every   3 months for as long as you are taking PrEP.  PREGNANCY   If you are premenopausal and you may become pregnant, ask your health care provider about preconception counseling.  If you may become pregnant, take 400 to 800 micrograms (mcg) of folic acid every day.  If you want to prevent pregnancy, talk to your health care provider about birth control (contraception). OSTEOPOROSIS AND MENOPAUSE   Osteoporosis is a disease in which the bones lose minerals and strength with aging. This can result in serious bone fractures. Your risk for osteoporosis can be identified using a bone density scan.  If you are 55 years of age or older, or if you are at risk for osteoporosis and fractures, ask your health care provider if you should be screened.  Ask your health care provider whether you should take a calcium or vitamin D supplement to lower your risk for osteoporosis.  Menopause may have certain physical symptoms and risks.  Hormone replacement therapy may reduce some of these symptoms and risks. Talk to your health care provider about whether hormone replacement therapy is right for you.  HOME CARE INSTRUCTIONS   Schedule regular health, dental, and eye exams.  Stay current with your immunizations.   Do not use any tobacco products including cigarettes, chewing tobacco, or electronic cigarettes.  If you are pregnant, do not drink alcohol.  If you are breastfeeding, limit how much and how often you drink alcohol.  Limit alcohol intake to no more than 1 drink per day for nonpregnant women. One drink equals 12  ounces of beer, 5 ounces of wine, or 1 ounces of hard liquor.  Do not use street drugs.  Do not share needles.  Ask your health care provider for help if you need support or information about quitting drugs.  Tell your health care provider if you often feel depressed.  Tell your health care provider if you have ever been abused or do not feel safe at home.   This information is not intended to replace advice given to you by your health care provider. Make sure you discuss any questions you have with your health care provider.   Document Released: 03/05/2011 Document Revised: 09/10/2014 Document Reviewed: 07/22/2013 Elsevier Interactive Patient Education [employer].

## 2016-02-13 NOTE — Progress Notes (Signed)
Pre visit review using our clinic review tool, if applicable. No additional management support is needed unless otherwise documented below in the visit note. 

## 2016-02-13 NOTE — Assessment & Plan Note (Signed)
Mybetriq refilled today as it is effective and no side effects.

## 2016-02-13 NOTE — Assessment & Plan Note (Signed)
Checking labs, lipids normal. Checking hep C screening today. Not exercising and counseled about that. Colonoscopy up to date and mammogram and aged out of pap smear screening. Given 10 year recommendations for screening at today's visit.

## 2016-02-13 NOTE — Progress Notes (Signed)
   Subjective:    Patient ID: Mary Malone, female    DOB: 24-Jul-1946, 70 y.o.   MRN: [id number]  HPI Here for medicare wellness, no new complaints. Please see A/P for status and treatment of chronic medical problems.   Diet: heart healthy Physical activity: sedentary Depression/mood screen: negative Hearing: intact to whispered voice, mild loss Visual acuity: grossly normal with lens, performs annual eye exam  ADLs: capable Fall risk: low Home safety: good Cognitive evaluation: intact to orientation, naming, recall and repetition EOL planning: adv directives discussed, in place  I have personally reviewed and have noted 1. The patient's medical and social history - reviewed today no changes 2. Their use of alcohol, tobacco or illicit drugs 3. Their current medications and supplements 4. The patient's functional ability including ADL's, fall risks, home safety risks and hearing or visual impairment. 5. Diet and physical activities 6. Evidence for depression or mood disorders 7. Care team reviewed and updated (available in snapshot)  Review of Systems  Constitutional: Negative for fever, activity change, appetite change, fatigue and unexpected weight change.  HENT: Negative.   Eyes: Negative.   Respiratory: Negative for cough, chest tightness, shortness of breath and wheezing.   Cardiovascular: Negative for chest pain, palpitations and leg swelling.  Gastrointestinal: Negative for abdominal pain, diarrhea, constipation and abdominal distention.  Musculoskeletal: Negative for myalgias and arthralgias.  Skin: Negative.   Neurological: Negative.   Psychiatric/Behavioral: Negative.       Objective:   Physical Exam  Constitutional: She is oriented to person, place, and time. She appears well-developed and well-nourished.  HENT:  Head: Normocephalic and atraumatic.  Right Ear: External ear normal.  Left Ear: External ear normal.  Eyes: EOM are normal.  Neck: Normal range of  motion.  Cardiovascular: Normal rate and regular rhythm.   Pulmonary/Chest: Effort normal. No respiratory distress. She has no wheezes.  Abdominal: Soft. Bowel sounds are normal. She exhibits no distension. There is no tenderness. There is no rebound.  Musculoskeletal: She exhibits no edema.  Neurological: She is alert and oriented to person, place, and time. Coordination normal.  Skin: Skin is warm and dry.  Psychiatric: She has a normal mood and affect.   Filed Vitals:   02/13/16 1307  BP: 122/84  Pulse: 92  Temp: 98.2 F (36.8 C)  TempSrc: Oral  Resp: 12  Height: 5\' 5"  (1.651 m)  Weight: 126 lb (57.153 kg)  SpO2: 99%      Assessment & Plan:

## 2016-02-14 LAB — HEPATITIS C ANTIBODY: HCV Ab: NEGATIVE

## 2016-02-17 ENCOUNTER — Ambulatory Visit: Payer: Medicare Other | Admitting: Internal Medicine

## 2016-02-20 ENCOUNTER — Ambulatory Visit (INDEPENDENT_AMBULATORY_CARE_PROVIDER_SITE_OTHER): Payer: Medicare Other | Admitting: Internal Medicine

## 2016-02-20 ENCOUNTER — Other Ambulatory Visit (INDEPENDENT_AMBULATORY_CARE_PROVIDER_SITE_OTHER): Payer: Medicare Other

## 2016-02-20 ENCOUNTER — Encounter: Payer: Self-pay | Admitting: Internal Medicine

## 2016-02-20 VITALS — BP 118/80 | HR 90 | Ht 65.0 in | Wt 127.0 lb

## 2016-02-20 DIAGNOSIS — J302 Other seasonal allergic rhinitis: Secondary | ICD-10-CM

## 2016-02-20 DIAGNOSIS — J309 Allergic rhinitis, unspecified: Secondary | ICD-10-CM | POA: Diagnosis not present

## 2016-02-20 DIAGNOSIS — H9193 Unspecified hearing loss, bilateral: Secondary | ICD-10-CM | POA: Diagnosis not present

## 2016-02-20 DIAGNOSIS — J3089 Other allergic rhinitis: Secondary | ICD-10-CM

## 2016-02-20 LAB — CBC WITH DIFFERENTIAL/PLATELET
Basophils Absolute: 0.1 10*3/uL (ref 0.0–0.1)
Basophils Relative: 1.4 % (ref 0.0–3.0)
Eosinophils Absolute: 0.2 10*3/uL (ref 0.0–0.7)
Eosinophils Relative: 3.1 % (ref 0.0–5.0)
HCT: 38.5 % (ref 36.0–46.0)
Hemoglobin: 13.1 g/dL (ref 12.0–15.0)
Lymphocytes Relative: 27 % (ref 12.0–46.0)
Lymphs Abs: 1.8 10*3/uL (ref 0.7–4.0)
MCHC: 34.1 g/dL (ref 30.0–36.0)
MCV: 91.8 fl (ref 78.0–100.0)
Monocytes Absolute: 0.5 10*3/uL (ref 0.1–1.0)
Monocytes Relative: 6.9 % (ref 3.0–12.0)
Neutro Abs: 4.1 10*3/uL (ref 1.4–7.7)
Neutrophils Relative %: 61.6 % (ref 43.0–77.0)
Platelets: 271 10*3/uL (ref 150.0–400.0)
RBC: 4.2 Mil/uL (ref 3.87–5.11)
RDW: 12.6 % (ref 11.5–15.5)
WBC: 6.7 10*3/uL (ref 4.0–10.5)

## 2016-02-20 LAB — SEDIMENTATION RATE: Sed Rate: 10 mm/hr (ref 0–30)

## 2016-02-20 LAB — C-REACTIVE PROTEIN: CRP: <0.1 mg/dL — ABNORMAL LOW (ref 0.5–20.0)

## 2016-02-20 NOTE — Patient Instructions (Signed)
Try nasal saline spray often as needed to help with the drainage, especially at night.  Order- lab- CBC w diff, Total IgE, sed rate, CRP, ANA      Dx rhinitis  Please call as needed

## 2016-02-20 NOTE — Progress Notes (Signed)
12/28/11- 60 yoF never smoker (widow- husb died of lung Ca), followed for allergic rhinitis, complicated by GERD. And nasal congestion, perennial, worse lying down. Sometimes makes her get up. Fluid feeling in the ears without popping. Ears itch. Pollen is making her sneeze and cough without wheeze. Postnasal drip varies. Denies headache, purulent discharge or fever. Taking occasional Claritin-D and Nasonex does help.  01/22/13- 70 yoF never smoker (widow- husb died of lung Ca), followed for allergic rhinitis, complicated by GERD. FOLLOWS FOR: throat feels fuller than usual when outside(feels like a pollen collection) and having dizzy spells from ears (both). Almost feels like there is fluid in the ears. REFILLS for 90 day supply for Nasonex and Astepro sent to pharmacy on file. Pollen season only caused throat irritation this year. Some pressure in her ears made her feel unsteady. Today she feels well. Likes Nasonex. Denies cough. Aware of chronic GERD. Takes Prilosec every other day as a compromise to minimize bone loss.  02/15/14- 31 yoF never smoker (widow- husb died of lung Ca), followed for allergic rhinitis, complicated by GERD. FOLLOWS FOR: PND-white in color-happens every day and getting stuck in her throat. Using Nasonex and Astepro at night. Alternates Pepcid with Prilosec but does not feel reflux.  02/17/15- 64 yoF never smoker (widow- husb died of lung Ca), followed for allergic rhinitis, complicated by GERD. Follows For: Pt c/o ear and throat inflammation. Pt no longer taking nasonex but switched to nasacort.  Nasacort plus Astelin usually helps. Hearing her heartbeat in the left ear. Not tinnitus. Primary physician told her throat was red. She denies reflux symptoms.  05/12/15-68 yoF never smoker (widow- husb died of lung Ca), followed for allergic rhinitis, complicated by GERD. Acute: FOLLOWS FOR: pt c/o not being able to hear , ears are clogged, posnasal drip is bad. pt states shes been  like this since the end of july. She declines flu vaccine [place]/ ENT for nerve loss hearing deficit. He questioned allergy component to her hearing problems but also conducted/nerve damage. She is on prednisone 20 mg daily for one more day. Complains of pressure in the right ear-won't pop. Left ear occasionally pops. Chest is comfortable. Using Nasacort and Astelin nasal sprays.  02/20/2016-70 year old female never smoker (widow-husband died of lung cancer) followed for allergic rhinitis, complicated by GERD FOLLOWS FOR: No troubles with allergies; has trouble with ears-right ear-has been seen by ENT previously this year. Ongoing concern with sudden onset hearing loss and bilateral sense of pressure.Dr Janace Hoard attributed to "allergy" then referred her to ENT at [facility]. Extensive testing. Myringotomy tube on right helped only briefly. Sudafed helps briefly. Labs showed IgE only 12 with slight elevation only for cat, making an allergy mechanism unlikely. Neurology evaluation did not think MS was likely. Chest is been comfortable without cough or wheeze. Not much sneeze or postnasal drainage.  ROS-see HPI Constitutional:   No-   weight loss, night sweats, fevers, chills, fatigue, lassitude. HEENT:   No-  headaches, difficulty swallowing, tooth/dental problems, sore throat,        sneezing, itching, +ear ache, nasal congestion, +post nasal drip,  CV:  No-   chest pain, orthopnea, PND, swelling in lower extremities, anasarca,  dizziness, palpitations Resp: No-   shortness of breath with exertion or at rest.              No-   productive cough,   non-productive cough,  No- coughing up of blood.  No-   change in color of mucus.  No- wheezing.   Skin: No-   rash or lesions. GI:    heartburn, indigestion,no- abdominal pain, nausea, vomiting,  GU:  MS:  No-   joint pain or swelling.   Neuro-     nothing unusual Psych:  No- change in mood or affect. No depression or anxiety.  No memory  loss.  OBJ- Physical Exam General- Alert, Oriented, Affect-appropriate, Distress- none acute, trim Skin- rash-none, lesions- none, excoriation- none Lymphadenopathy- none Head- atraumatic            Eyes- Gross vision intact, PERRLA, conjunctivae and secretions clear            Ears- , + I do not see retraction of drums            Nose- turbinate edema, no-Septal dev, mucus, polyps, erosion, perforation             Throat- Mallampati II , mucosa clear , drainage- none, tonsils- atrophic Neck- flexible , trachea midline, no stridor , thyroid nl, carotid no bruit Chest - symmetrical excursion , unlabored           Heart/CV- RRR , no murmur , no gallop  , no rub, nl s1 s2                           - JVD- none , edema- none, stasis changes- none, varices- none           Lung- clear to P&A, wheeze- none, cough- none , dullness-none, rub- none           Chest wall-  Abd-  Br/ Gen/ Rectal- Not done, not indicated Extrem- cyanosis- none, clubbing, none, atrophy- none, strength- nl Neuro- grossly intact to observation

## 2016-02-21 DIAGNOSIS — H9190 Unspecified hearing loss, unspecified ear: Secondary | ICD-10-CM | POA: Insufficient documentation

## 2016-02-21 LAB — IGE: IgE (Immunoglobulin E), Serum: 7 kU/L (ref <115)

## 2016-02-21 LAB — ANA: Anti Nuclear Antibody(ANA): NEGATIVE

## 2016-02-21 NOTE — Assessment & Plan Note (Signed)
I can't demonstrate significant IgE/atopic sensitization. There might be some nonspecific rhinitis with eustachian dysfunction. Possible nonallergic rhinitis but I don't know that it would explain loss of hearing. Plan-lab looking for markers of inflammation including sedimentation rate, ANA, CRP. Try liberal use of nasal saline spray.

## 2016-02-21 NOTE — Assessment & Plan Note (Addendum)
I can't show much of an IgE/atopic basis for problems, even if the mechanism were eustachian dysfunction. There may be some mild allergic and nonallergic rhinitis. Plan-nasal saline spray, Sudafed

## 2016-02-23 NOTE — Progress Notes (Signed)
Quick Note:  Called spoke with pt. Reviewed results and recs. Pt voiced understanding and had no further questions. ______ 

## 2016-03-08 ENCOUNTER — Other Ambulatory Visit: Payer: Self-pay | Admitting: Obstetrics and Gynecology

## 2016-03-09 NOTE — Telephone Encounter (Signed)
Medication refill request: PREMARIN vaginal cream Last AEX:  04/20/15 BS Next AEX: 05/10/16  Last MMG (if hormonal medication request): 02/07/16 BIRADS1 negative Refill authorized: 04/20/15 #60g w/0 refills; today please advise

## 2016-04-03 ENCOUNTER — Ambulatory Visit: Payer: Medicare Other | Admitting: Nurse Practitioner

## 2016-05-08 ENCOUNTER — Other Ambulatory Visit: Payer: Self-pay | Admitting: Internal Medicine

## 2016-05-10 ENCOUNTER — Ambulatory Visit (INDEPENDENT_AMBULATORY_CARE_PROVIDER_SITE_OTHER): Payer: Medicare Other | Admitting: Obstetrics and Gynecology

## 2016-05-10 ENCOUNTER — Encounter: Payer: Self-pay | Admitting: Obstetrics and Gynecology

## 2016-05-10 VITALS — BP 120/78 | HR 80 | Resp 14 | Ht 65.0 in | Wt 125.8 lb

## 2016-05-10 DIAGNOSIS — M8580 Other specified disorders of bone density and structure, unspecified site: Secondary | ICD-10-CM | POA: Diagnosis not present

## 2016-05-10 DIAGNOSIS — N812 Incomplete uterovaginal prolapse: Secondary | ICD-10-CM | POA: Diagnosis not present

## 2016-05-10 DIAGNOSIS — N3281 Overactive bladder: Secondary | ICD-10-CM

## 2016-05-10 DIAGNOSIS — Z01419 Encounter for gynecological examination (general) (routine) without abnormal findings: Secondary | ICD-10-CM | POA: Diagnosis not present

## 2016-05-10 DIAGNOSIS — Z780 Asymptomatic menopausal state: Secondary | ICD-10-CM | POA: Diagnosis not present

## 2016-05-10 MED ORDER — ESTROGENS, CONJUGATED 0.625 MG/GM VA CREA: TOPICAL_CREAM | VAGINAL | 2 refills | Status: DC

## 2016-05-10 NOTE — Progress Notes (Signed)
70 y.o. G0P0 Married Caucasian female here for annual exam.    Likes the Premarin cream 1/2 gram twice a week.  No bladder infections.   Wants pap today.   Takes the [facility] for overactive bladder.  Interested in physical therapy.   Just lost 2 kittens who were terminally ill.   PCP:  Pricilla Holm, MD  Patient's last menstrual period was 09/03/1996.           Sexually active: No. female The current method of family planning is post menopausal status.    Exercising: No.   Smoker:  no  Health Maintenance: Pap: 04-09-14 Ascus, negative HR HPV. History of abnormal Pap:  Yes, Hx HR HPV 2002? MMG:  02-08-16 Density C/Neg/Birads1:[facility] Colonoscopy:  06-16-14 normal with Dr. Lenise Herald due 06/2024. BMD:   04-19-14  Result - Osteopenia hip and spine :[facility] TDaP:  03/2013 Gardasil:   N/A HIV:  Has donated blood more than 10 years ago.  Hep C:  Negative 2016. Screening Labs:  Hb today: PCP, Urine today: PCP   reports that she has never smoked. She has never used smokeless tobacco. She reports that she does not drink alcohol or use drugs.  Past Medical History:  Diagnosis Date  . ALLERGIC RHINITIS   . Dense breast   . DEPRESSION   . Fibroid   . GERD   . Hearing loss   . HYPERLIPIDEMIA    diet control efforts  . Osteoarthritis   . Osteopenia 02/2006  . PMB (postmenopausal bleeding) 12/2001   polyps  . URINARY INCONTINENCE    OAB  . Wisdom teeth removed 1866/1967    Past Surgical History:  Procedure Laterality Date  . CATARACT EXTRACTION Bilateral 07/2012, 09/2012  . DILATION AND CURETTAGE OF UTERUS  2003  . HYSTEROSCOPY  01/2002   resection, sm fibroid  . Left knee surgey    . MENISCUS REPAIR     left knee 2010  . REFRACTIVE SURGERY  02/2016  . WISDOM TOOTH EXTRACTION      Current Outpatient Prescriptions  Medication Sig Dispense Refill  . aspirin 81 MG tablet Take 81 mg by mouth daily.    . Calcium Citrate-Vitamin D (CITRACAL + D PO) Take  1,200 mg by mouth daily.    . Cholecalciferol (VITAMIN D3) 2000 UNITS capsule Take 2,000 Units by mouth daily.    . cromolyn (NASALCROM) 5.2 MG/ACT nasal spray Place 1 spray into both nostrils 2 (two) times daily.    . famotidine (PEPCID AC) 10 MG chewable tablet Chew 10 mg by mouth 2 (two) times daily.    . mirabegron ER (MYRBETRIQ) 50 MG TB24 tablet TAKE 1 TABLET BY MOUTH DAILY 90 tablet 3  . montelukast (SINGULAIR) 10 MG tablet Take 1 tablet (10 mg total) by mouth at bedtime. 30 tablet 11  . Multiple Vitamins-Minerals (CENTRUM SILVER) tablet Take 1 tablet by mouth daily.    . Omega-3 Fatty Acids (FISH OIL) 1200 MG CAPS Take by mouth daily.    Marland Kitchen PREMARIN vaginal cream USE 1/2 GRAM VAGINALLY TWICE WEEKLY 60 g 0  . pseudoephedrine (SUDAFED) 120 MG 12 hr tablet Take 120 mg by mouth 2 (two) times daily.    . Psyllium (METAMUCIL PO) Take by mouth as needed.    . vitamin E 400 UNIT capsule Take 800 Units by mouth daily.     . Vitamins-Lipotropics (LIPO-FLAVONOID PLUS) TABS Take by mouth. Two tablets daily.     No current facility-administered medications for  this visit.     Family History  Problem Relation Age of Onset  . Alzheimer's disease Mother   . Allergies Mother   . Breast cancer Mother 45  . Dementia Mother   . Lung cancer Father 38  . Allergies Brother   . Diabetes Brother   . Transient ischemic attack Maternal Grandmother   . Peripheral Artery Disease Maternal Grandmother     s/p B leg amputation  . Stroke Maternal Grandmother   . Tuberculosis Maternal Grandfather   . Diabetes Brother 64  . Alzheimer's disease Brother 35    early onset  . Sudden death Paternal Grandmother 5    unknown    ROS:  Pertinent items are noted in HPI.  Otherwise, a comprehensive ROS was negative.  Exam:   BP 120/78 (BP Location: Right Arm, Patient Position: Sitting, Cuff Size: Normal)   Pulse 80   Resp 14   Ht 5\' 5"  (1.651 m)   Wt 125 lb 12.8 oz (57.1 kg)   LMP 09/03/1996   BMI 20.93  kg/m     General appearance: alert, cooperative and appears stated age Head: Normocephalic, without obvious abnormality, atraumatic Neck: no adenopathy, supple, symmetrical, trachea midline and thyroid normal to inspection and palpation Lungs: clear to auscultation bilaterally Breasts: normal appearance, no masses or tenderness, No nipple retraction or dimpling, No nipple discharge or bleeding, No axillary or supraclavicular adenopathy Heart: regular rate and rhythm Abdomen: soft, non-tender; no masses, no organomegaly Extremities: extremities normal, atraumatic, no cyanosis or edema Skin: Skin color, texture, turgor normal. No rashes or lesions Lymph nodes: Cervical, supraclavicular, and axillary nodes normal. No abnormal inguinal nodes palpated Neurologic: Grossly normal  Pelvic: External genitalia:  no lesions              Urethra:  normal appearing urethra with no masses, tenderness or lesions              Bartholins and Skenes: normal                 Vagina: normal appearing vagina with normal color and discharge, no lesions              Cervix: no lesions              Pap taken: Yes.   Bimanual Exam:  Uterus:  normal size, contour, position, consistency, mobility, non-tender              Adnexa: no mass, fullness, tenderness              Rectal exam: Yes.  .  Confirms.              Anus:  normal sphincter tone, no lesions  Chaperone was present for exam.  Assessment:   Well woman visit with normal exam. Overactive bladder.   Myrbetriq per PCP.  Incomplete uterovaginal prolapse. Osteopenia. Vulvovaginal atrophy.   Plan: Yearly mammogram recommended after age 84.  Recommended self breast exam.  Pap and HR HPV as above. Discussed Calcium, Vitamin D, regular exercise program including cardiovascular and weight bearing exercise. Continue Premarin vaginal cream 1/2 gram pv at hs twice weekly.  Dispense 30 grams RF 2.  Discussed risk of cardiovascular events and breast cancer  risk.  Referral to Ileana Roup for pelvic floor therapy.  BMD ordered.  [facility]  Follow up annually and prn.       After visit summary provided.

## 2016-05-10 NOTE — Patient Instructions (Signed)

## 2016-05-14 LAB — IPS PAP SMEAR ONLY

## 2016-06-16 ENCOUNTER — Other Ambulatory Visit: Payer: Self-pay | Admitting: Internal Medicine

## 2016-06-21 ENCOUNTER — Encounter: Payer: Self-pay | Admitting: Geriatric Medicine

## 2016-07-11 ENCOUNTER — Ambulatory Visit
Admission: RE | Admit: 2016-07-11 | Discharge: 2016-07-11 | Disposition: A | Discharge status: Home / Self Care | Payer: Medicare Other | Source: Ambulatory Visit | Attending: Obstetrics and Gynecology | Admitting: Obstetrics and Gynecology

## 2016-07-11 DIAGNOSIS — Z780 Asymptomatic menopausal state: Secondary | ICD-10-CM

## 2016-07-11 DIAGNOSIS — M8580 Other specified disorders of bone density and structure, unspecified site: Secondary | ICD-10-CM

## 2016-07-12 ENCOUNTER — Telehealth: Payer: Self-pay | Admitting: *Deleted

## 2016-07-12 DIAGNOSIS — M810 Age-related osteoporosis without current pathological fracture: Secondary | ICD-10-CM

## 2016-07-12 DIAGNOSIS — M8588 Other specified disorders of bone density and structure, other site: Secondary | ICD-10-CM

## 2016-07-12 NOTE — Telephone Encounter (Signed)
-----   Message from Nunzio Cobbs, MD sent at 07/11/2016  8:10 PM EST ----- Results to patient through My Chart. Please contact patient in follow up to this note.   Hello [name],   I am sharing the results of your bone density with you.  Your spine has some mild bone thinning called osteopenia, but the hip is showing severe osteoporosis.  This has undergone quite a change since the last bone density which was done 2 years ago.   I would like to have you see Dr. Loanne Drilling, an endocrinologist with [facility]  He is a specialist in this area.   You may qualify for more potent medication to treat bone thinning in order to reduce risk of fracture.   I will have the nurse contact you to facilitate this referral process and be sure that you receive my message.   Thank you,   Josefa Half, MD  Cc- Marisa Sprinkles

## 2016-07-12 NOTE — Telephone Encounter (Signed)
Spoke with patient, advised as seen below per Dr. Elza Rafter request. Patient verbalizes understanding and is agreeable.   Order placed for referral.   Routing to provider for final review. Patient is agreeable to disposition. Will close encounter.   Cc: Theresia Lo

## 2016-08-05 NOTE — Progress Notes (Signed)
Subjective:    Patient ID: Mary Malone, female    DOB: Sep 22, 1945, 70 y.o.   MRN: [id number]  HPI Pt is referred by Dr Sharlet Salina, for osteoporosis.  Pt was noted to have osteoporosis 4 weeks ago.  She has never been on medication for this.  she has never had bony fracture.  She has no history of any of the following: early menopause, multiple myeloma, renal dz, hyperthyroidism, prolonged bedrest, alcoholism, smoking, liver dz, hereditary bone problems, primary hyperparathyroidism.  She does not take heparin or anticonvulsants.  She took prednisone x 10 days, in 2016.  She took rx vit-D x a few years--she changed to non-rx in approx 2014, due to improvement in blood level.  Past Medical History:  Diagnosis Date  . ALLERGIC RHINITIS   . Dense breast   . DEPRESSION   . Fibroid   . GERD   . Hearing loss   . HYPERLIPIDEMIA    diet control efforts  . Osteoarthritis   . Osteopenia 02/2006  . PMB (postmenopausal bleeding) 12/2001   polyps  . URINARY INCONTINENCE    OAB  . Wisdom teeth removed 1866/1967    Past Surgical History:  Procedure Laterality Date  . CATARACT EXTRACTION Bilateral 07/2012, 09/2012  . DILATION AND CURETTAGE OF UTERUS  2003  . HYSTEROSCOPY  01/2002   resection, sm fibroid  . Left knee surgey    . MENISCUS REPAIR     left knee 2010  . REFRACTIVE SURGERY  02/2016  . WISDOM TOOTH EXTRACTION      Social History   Social History  . Marital status: Married    Spouse name: N/A  . Number of children: N/A  . Years of education: N/A   Occupational History  . Not on file.   Social History Main Topics  . Smoking status: Never Smoker  . Smokeless tobacco: Never Used  . Alcohol use No  . Drug use: No  . Sexual activity: No   Other Topics Concern  . Not on file   Social History Narrative  . No narrative on file    Current Outpatient Prescriptions on File Prior to Visit  Medication Sig Dispense Refill  . aspirin 81 MG tablet Take 81 mg by mouth daily.      . Calcium Citrate-Vitamin D (CITRACAL + D PO) Take 1,200 mg by mouth daily.    . Cholecalciferol (VITAMIN D3) 2000 UNITS capsule Take 2,000 Units by mouth daily.    Marland Kitchen conjugated estrogens (PREMARIN) vaginal cream USE 1/2 GRAM VAGINALLY TWICE WEEKLY 30 g 2  . cromolyn (NASALCROM) 5.2 MG/ACT nasal spray Place 1 spray into both nostrils 2 (two) times daily.    . famotidine (PEPCID AC) 10 MG chewable tablet Chew 10 mg by mouth 2 (two) times daily.    . montelukast (SINGULAIR) 10 MG tablet TAKE 1 TABLET(10 MG) BY MOUTH AT BEDTIME 30 tablet 0  . Multiple Vitamins-Minerals (CENTRUM SILVER) tablet Take 1 tablet by mouth daily.    . Omega-3 Fatty Acids (FISH OIL) 1200 MG CAPS Take by mouth daily.    . pseudoephedrine (SUDAFED) 120 MG 12 hr tablet Take 120 mg by mouth 2 (two) times daily.    . vitamin E 400 UNIT capsule Take 800 Units by mouth daily.     . Vitamins-Lipotropics (LIPO-FLAVONOID PLUS) TABS Take by mouth. Two tablets daily.     No current facility-administered medications on file prior to visit.     No Known Allergies  Family  History  Problem Relation Age of Onset  . Alzheimer's disease Mother   . Allergies Mother   . Breast cancer Mother 31  . Dementia Mother   . Lung cancer Father 65  . Allergies Brother   . Diabetes Brother   . Transient ischemic attack Maternal Grandmother   . Peripheral Artery Disease Maternal Grandmother     s/p B leg amputation  . Stroke Maternal Grandmother   . Tuberculosis Maternal Grandfather   . Diabetes Brother 34  . Alzheimer's disease Brother 35    early onset  . Sudden death Paternal Grandmother 74    unknown  . Osteoporosis Neg Hx     BP 128/62   Pulse 84   Ht '5\' 5"'$  (1.651 m)   Wt 126 lb (57.2 kg)   LMP 09/03/1996   SpO2 99%   BMI 20.97 kg/m    Review of Systems denies hematuria, heartburn, cold intolerance, edema, skin rash, falls, cramps, memory loss, back pain. She has lost 45 lbs, x 5 years.  Heartburn is well-controlled.   She has chronic rhinorrhea, insomnia, and easy bruising.     Objective:   Physical Exam VS: see vs page GEN: no distress HEAD: head: no deformity eyes: no periorbital swelling, no proptosis external nose and ears are normal mouth: no lesion seen NECK: supple, thyroid is not enlarged CHEST WALL: no deformity.  No kyphosis LUNGS: clear to auscultation CV: reg rate and rhythm, no murmur.   ABD: abdomen is soft, nontender.  no hepatosplenomegaly.  not distended.  no hernia MUSCULOSKELETAL: muscle bulk and strength are grossly normal.  no obvious joint swelling.  gait is normal and steady EXTEMITIES: no edema.  PULSES: dorsalis pedis intact bilat.  no carotid bruit.   NEURO:  cn 2-12 grossly intact.   readily moves all 4's.  sensation is intact to touch on all 4's.  SKIN:  Normal texture and temperature.  No rash or suspicious lesion is visible.   NODES:  None palpable at the neck PSYCH: alert, well-oriented.  Does not appear anxious nor depressed.   Lab Results  Component Value Date   TSH 3.74 02/13/2016   I have reviewed outside records, and summarized: Pt was noted to have low BMD, and referred here.  She got symptomatic relief from premarin cream.   Lab Results  Component Value Date   CALCIUM 10.2 02/13/2016    DEXA: The BMD measured at Femur Total Right is 0.490 g/cm2 with a T-score of -4.1    Assessment & Plan:  Osteoporosis, new to me.  No obvious cause, except for menopausal state.  She is hesitant to take any infusion, such as Reclast.   GERD: we discussed.  She'll try fosamax first, with prilosec.    Patient is advised the following: Patient Instructions  Take calcium 1200 mg per day, and vitamin-D, 400 units per day I have sent a prescription to your pharmacy, for alendronate. Instructions on Fosamax use and side effects - particularly esophageal adverse events This drug must be taken upon arising for the day on an empty stomach, with a large 6-8 ounce glass of  water only.  remain in the upright position for at least 30 minutes afterwards and until after the first food of the day. If esophageal irritation or heartburn are noted, please stop the drug and call here.    As this causes heartburn, you should take prilosec 40 mg the day before and day of each alendronate pill.   Please return  in 1 year.

## 2016-08-07 ENCOUNTER — Ambulatory Visit (INDEPENDENT_AMBULATORY_CARE_PROVIDER_SITE_OTHER): Payer: Medicare Other | Admitting: Endocrinology

## 2016-08-07 ENCOUNTER — Encounter: Payer: Self-pay | Admitting: Endocrinology

## 2016-08-07 DIAGNOSIS — M810 Age-related osteoporosis without current pathological fracture: Secondary | ICD-10-CM

## 2016-08-07 MED ORDER — ALENDRONATE SODIUM 70 MG PO TABS: 70.0000 mg | ORAL_TABLET | ORAL | 11 refills | Status: DC

## 2016-08-07 NOTE — Patient Instructions (Addendum)
Take calcium 1200 mg per day, and vitamin-D, 400 units per day I have sent a prescription to your pharmacy, for alendronate. Instructions on Fosamax use and side effects - particularly esophageal adverse events This drug must be taken upon arising for the day on an empty stomach, with a large 6-8 ounce glass of water only.  remain in the upright position for at least 30 minutes afterwards and until after the first food of the day. If esophageal irritation or heartburn are noted, please stop the drug and call here.    As this causes heartburn, you should take prilosec 40 mg the day before and day of each alendronate pill.   Please return in 1 year.

## 2016-08-09 DIAGNOSIS — M810 Age-related osteoporosis without current pathological fracture: Secondary | ICD-10-CM | POA: Insufficient documentation

## 2016-10-12 ENCOUNTER — Telehealth: Payer: Self-pay | Admitting: Internal Medicine

## 2016-10-12 MED ORDER — MONTELUKAST SODIUM 10 MG PO TABS: ORAL_TABLET | ORAL | 11 refills | Status: DC

## 2016-10-12 NOTE — Telephone Encounter (Signed)
Pt c/o nasal and ear congestion for the last few months-worsening. Pt states that she is still having hearing issues.  Pt unable to come back in to be seen until June 2018 due insurance and just needs Rx renewed for Singulair - last filled in June 2017 for long term and has been given two temporary 30-day Rx's to hold her over until her upcoming appt (June 2018).  Please advise Dr Annamaria Boots. Thanks.   Allergies as of 10/12/2016   No Known Allergies     Medication List       Accurate as of 10/12/16  3:27 PM. Always use your most recent med list.          alendronate 70 MG tablet Commonly known as:  FOSAMAX Take 1 tablet (70 mg total) by mouth once a week. Take with a full glass of water on an empty stomach.   aspirin 81 MG tablet Take 81 mg by mouth daily.   CENTRUM SILVER tablet Take 1 tablet by mouth daily.   CITRACAL + D PO Take 1,200 mg by mouth daily.   conjugated estrogens vaginal cream Commonly known as:  PREMARIN USE 1/2 GRAM VAGINALLY TWICE WEEKLY   famotidine 10 MG chewable tablet Commonly known as:  PEPCID AC Chew 10 mg by mouth 2 (two) times daily.   Fish Oil 1200 MG Caps Take by mouth daily.   LIPO-FLAVONOID PLUS Tabs Take by mouth. Two tablets daily.   montelukast 10 MG tablet Commonly known as:  SINGULAIR TAKE 1 TABLET(10 MG) BY MOUTH AT BEDTIME   NASALCROM 5.2 MG/ACT nasal spray Generic drug:  cromolyn Place 1 spray into both nostrils 2 (two) times daily.   PROBIOTIC-10 Caps Take by mouth.   pseudoephedrine 120 MG 12 hr tablet Commonly known as:  SUDAFED Take 120 mg by mouth 2 (two) times daily.   Vitamin D3 2000 units capsule Take 2,000 Units by mouth daily.   vitamin E 400 UNIT capsule Take 800 Units by mouth daily.      No Known Allergies

## 2016-10-12 NOTE — Telephone Encounter (Signed)
Spoke with pt. She is aware of CY's recommendation. States that she already has an ENT. Rx has been sent in. Nothing further was needed.

## 2016-10-12 NOTE — Telephone Encounter (Signed)
Ok to refill singulair x 12 months if that is what she is asking. She would see ENT for chronic ear problems.

## 2016-12-24 ENCOUNTER — Other Ambulatory Visit: Payer: Self-pay | Admitting: Internal Medicine

## 2016-12-24 DIAGNOSIS — Z1231 Encounter for screening mammogram for malignant neoplasm of breast: Secondary | ICD-10-CM

## 2017-02-07 ENCOUNTER — Ambulatory Visit
Admission: RE | Admit: 2017-02-07 | Discharge: 2017-02-07 | Disposition: A | Discharge status: Home / Self Care | Payer: Medicare Other | Source: Ambulatory Visit | Attending: Internal Medicine | Admitting: Internal Medicine

## 2017-02-07 DIAGNOSIS — Z1231 Encounter for screening mammogram for malignant neoplasm of breast: Secondary | ICD-10-CM

## 2017-02-14 ENCOUNTER — Ambulatory Visit: Payer: Medicare Other | Admitting: Internal Medicine

## 2017-02-19 ENCOUNTER — Encounter: Payer: Self-pay | Admitting: Internal Medicine

## 2017-02-19 ENCOUNTER — Ambulatory Visit (INDEPENDENT_AMBULATORY_CARE_PROVIDER_SITE_OTHER)
Admission: RE | Admit: 2017-02-19 | Discharge: 2017-02-19 | Disposition: A | Discharge status: Home / Self Care | Payer: Medicare Other | Source: Ambulatory Visit | Attending: Internal Medicine | Admitting: Internal Medicine

## 2017-02-19 ENCOUNTER — Ambulatory Visit (INDEPENDENT_AMBULATORY_CARE_PROVIDER_SITE_OTHER): Payer: Medicare Other | Admitting: Internal Medicine

## 2017-02-19 VITALS — BP 114/72 | HR 90 | Ht 65.0 in | Wt 128.4 lb

## 2017-02-19 DIAGNOSIS — H9190 Unspecified hearing loss, unspecified ear: Secondary | ICD-10-CM | POA: Diagnosis not present

## 2017-02-19 DIAGNOSIS — J302 Other seasonal allergic rhinitis: Secondary | ICD-10-CM

## 2017-02-19 DIAGNOSIS — R911 Solitary pulmonary nodule: Secondary | ICD-10-CM | POA: Diagnosis not present

## 2017-02-19 DIAGNOSIS — Z23 Encounter for immunization: Secondary | ICD-10-CM | POA: Diagnosis not present

## 2017-02-19 DIAGNOSIS — J3089 Other allergic rhinitis: Secondary | ICD-10-CM

## 2017-02-19 MED ORDER — AZELASTINE-FLUTICASONE 137-50 MCG/ACT NA SUSP: 1.0000 | Freq: Every day | NASAL | 0 refills | Status: DC

## 2017-02-19 NOTE — Assessment & Plan Note (Signed)
3 mm right upper lobe nodule described incidentally on CT of the head/[facility] in 2016  Plan-CXR

## 2017-02-19 NOTE — Assessment & Plan Note (Signed)
Long-standing complaint of postnasal drip sensation. 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 Plan-try Dymista, try saline rinse

## 2017-02-19 NOTE — Assessment & Plan Note (Signed)
She would like to try again with local ENT specifically for hearing

## 2017-02-19 NOTE — Patient Instructions (Addendum)
Sample Dymista nasal spray   1-2 puffs each nostril once daily at bedtime, maybe instead of Nasalcrom   Consider trying a nasal saline rinse in the evening before you use the Nasalcrom. See if it helps with the drainage sensation.  Order- referral to ENT Dr Vicie Mutters       Dx hearing loss  Order- CXR   Dx RUL lung nodule  Pneumovax 23  Please call if we can help

## 2017-02-19 NOTE — Progress Notes (Signed)
HPI female never smoker (widow-husband died of lung cancer) followed for allergic rhinitis, complicated by GERD IgE total 02/20/16- 7 EOS 0.2 Allergy Profile 05/12/15- negative with minor elevation only for cat dander, total IgE 12 ---------------------------------------------------------------------------------------------------------------------  05/12/15-68 yoF never smoker (widow- husb died of lung Ca), followed for allergic rhinitis, complicated by GERD. Acute: FOLLOWS FOR: pt c/o not being able to hear , ears are clogged, posnasal drip is bad. pt states shes been like this since the end of july. She declines flu vaccine [place]/ ENT for nerve loss hearing deficit. He questioned allergy component to her hearing problems but also conducted/nerve damage. She is on prednisone 20 mg daily for one more day. Complains of pressure in the right ear-won't pop. Left ear occasionally pops. Chest is comfortable. Using Nasacort and Astelin nasal sprays.  02/20/2016-71 year old female never smoker (widow-husband died of lung cancer) followed for allergic rhinitis, complicated by GERD FOLLOWS FOR: No troubles with allergies; has trouble with ears-right ear-has been seen by ENT previously this year. Ongoing concern with sudden onset hearing loss and bilateral sense of pressure.Dr Janace Hoard attributed to "allergy" then referred her to ENT at [facility]. Extensive testing. Myringotomy tube on right helped only briefly. Sudafed helps briefly. Labs showed IgE only 12 with slight elevation only for cat, making an allergy mechanism unlikely. Neurology evaluation did not think MS was likely. Chest is been comfortable without cough or wheeze. Not much sneeze or postnasal drainage.  02/19/17- 71 year old female never smoker (widow-husband died of lung cancer) followed for allergic rhinitis, complicated by GERD Follows for, Pt states still having issues with mucus. Had evaluation for decreased hearing at [facility]. Now asks my referral  to local ENT, interested in hearing aids. Continues Nasalcrom which she gets on line. Still complains of a postnasal drip sensation when she lies down at night, such that she tries to sleep with head elevated. Head CT at no 5 in 2016 mentioned a 3 mm right upper lobe nodule. She asked about checking that. She got a My Chart message about needing pneumonia vaccine. She had Prevnar 13 in 2016, Pneumovax in 2010. One more Pneumovax would be appropriate. She wouldn't need more after that.  ROS-see HPI Constitutional:   No-   weight loss, night sweats, fevers, chills, fatigue, lassitude. HEENT:   No-  headaches, difficulty swallowing, tooth/dental problems, sore throat,        sneezing, itching, ear ache, nasal congestion, +post nasal drip,  CV:  No-   chest pain, orthopnea, PND, swelling in lower extremities, anasarca,  dizziness, palpitations Resp: No-   shortness of breath with exertion or at rest.              No-   productive cough,   non-productive cough,  No- coughing up of blood.              No-   change in color of mucus.  No- wheezing.   Skin: No-   rash or lesions. GI:    heartburn, indigestion,no- abdominal pain, nausea, vomiting,  GU:  MS:  No-   joint pain or swelling.   Neuro-     nothing unusual Psych:  No- change in mood or affect. No depression or anxiety.  No memory loss.  OBJ- Physical Exam General- Alert, Oriented, Affect-appropriate, Distress- none acute, trim Skin- rash-none, lesions- none, excoriation- none Lymphadenopathy- none Head- atraumatic            Eyes- Gross vision intact, PERRLA, conjunctivae and secretions clear  Ears- no problem with face-to-face conversation in quiet exam room.            Nose- turbinate edema, no-Septal dev, mucus, polyps, erosion, perforation             Throat- Mallampati IV , mucosa clear , drainage- none, tonsils- atrophic Neck- flexible , trachea midline, no stridor , thyroid nl, carotid no bruit Chest - symmetrical  excursion , unlabored           Heart/CV- RRR , no murmur , no gallop  , no rub, nl s1 s2                           - JVD- none , edema- none, stasis changes- none, varices- none           Lung- clear to P&A, wheeze- none, cough- none , dullness-none, rub- none           Chest wall-  Abd-  Br/ Gen/ Rectal- Not done, not indicated Extrem- cyanosis- none, clubbing, none, atrophy- none, strength- nl Neuro- grossly intact to observation

## 2017-02-20 ENCOUNTER — Other Ambulatory Visit: Payer: Self-pay

## 2017-02-20 DIAGNOSIS — R918 Other nonspecific abnormal finding of lung field: Secondary | ICD-10-CM

## 2017-02-25 ENCOUNTER — Other Ambulatory Visit: Payer: Medicare Other

## 2017-02-25 DIAGNOSIS — R918 Other nonspecific abnormal finding of lung field: Secondary | ICD-10-CM

## 2017-02-25 LAB — BASIC METABOLIC PANEL
BUN: 15 mg/dL (ref 6–23)
CO2: 30 mEq/L (ref 19–32)
Calcium: 9.6 mg/dL (ref 8.4–10.5)
Chloride: 106 mEq/L (ref 96–112)
Creatinine, Ser: 0.76 mg/dL (ref 0.40–1.20)
GFR: 79.96 mL/min (ref >60.00)
Glucose, Bld: 107 mg/dL — ABNORMAL HIGH (ref 70–99)
Potassium: 4.9 mEq/L (ref 3.5–5.1)
Sodium: 141 mEq/L (ref 135–145)

## 2017-02-27 ENCOUNTER — Telehealth: Payer: Self-pay | Admitting: Internal Medicine

## 2017-02-27 ENCOUNTER — Ambulatory Visit (INDEPENDENT_AMBULATORY_CARE_PROVIDER_SITE_OTHER)
Admission: RE | Admit: 2017-02-27 | Discharge: 2017-02-27 | Disposition: A | Discharge status: Home / Self Care | Payer: Medicare Other | Source: Ambulatory Visit | Attending: Internal Medicine | Admitting: Internal Medicine

## 2017-02-27 DIAGNOSIS — R918 Other nonspecific abnormal finding of lung field: Secondary | ICD-10-CM

## 2017-02-27 MED ORDER — IOPAMIDOL (ISOVUE-300) INJECTION 61%
80.0000 mL | Freq: Once | INTRAVENOUS | Status: AC | PRN
  Administered 2017-02-27: 80 mL via INTRAVENOUS

## 2017-02-27 NOTE — Telephone Encounter (Signed)
Spoke with pt about her blood work per [employer]. Pt understood and had no further questions at this time.     Notes recorded by Deneise Lever, MD on 02/25/2017 at 9:16 PM EDT Basic chemistry - results are ok.

## 2017-03-01 ENCOUNTER — Telehealth: Payer: Self-pay | Admitting: Internal Medicine

## 2017-03-01 MED ORDER — AZELASTINE-FLUTICASONE 137-50 MCG/ACT NA SUSP: 1.0000 | Freq: Every day | NASAL | 11 refills | Status: DC

## 2017-03-01 NOTE — Telephone Encounter (Signed)
Pt requesting dymista to be sent to pharmacy.  This has been sent.  Nothing further needed.

## 2017-04-15 ENCOUNTER — Telehealth: Payer: Self-pay | Admitting: Endocrinology

## 2017-04-15 NOTE — Telephone Encounter (Signed)
Patient called to advise that she has been experiencing heartburn since yesterday. She was told by dr Loanne Drilling to d/c if she has this symptom. She d/c'ed yesterday. Last ov 08/07/2016. Please call patient at home #

## 2017-04-16 ENCOUNTER — Ambulatory Visit (INDEPENDENT_AMBULATORY_CARE_PROVIDER_SITE_OTHER): Payer: Medicare Other | Admitting: Endocrinology

## 2017-04-16 ENCOUNTER — Encounter: Payer: Self-pay | Admitting: Endocrinology

## 2017-04-16 VITALS — BP 132/82 | HR 79 | Ht 65.0 in | Wt 131.0 lb

## 2017-04-16 DIAGNOSIS — M810 Age-related osteoporosis without current pathological fracture: Secondary | ICD-10-CM | POA: Diagnosis not present

## 2017-04-16 LAB — BASIC METABOLIC PANEL
BUN: 18 mg/dL (ref 6–23)
CO2: 31 mEq/L (ref 19–32)
Calcium: 9.4 mg/dL (ref 8.4–10.5)
Chloride: 105 mEq/L (ref 96–112)
Creatinine, Ser: 0.76 mg/dL (ref 0.40–1.20)
GFR: 79.93 mL/min (ref >60.00)
Glucose, Bld: 88 mg/dL (ref 70–99)
Potassium: 4.8 mEq/L (ref 3.5–5.1)
Sodium: 140 mEq/L (ref 135–145)

## 2017-04-16 LAB — TSH: TSH: 5.22 u[IU]/mL — ABNORMAL HIGH (ref 0.35–4.50)

## 2017-04-16 LAB — VITAMIN D 25 HYDROXY (VIT D DEFICIENCY, FRACTURES): VITD: 37.68 ng/mL (ref 30.00–100.00)

## 2017-04-16 NOTE — Progress Notes (Signed)
Subjective:    Patient ID: Mary Malone, female    DOB: [birth year], 71 y.o.   MRN: [id number]  HPI  Pt returns for f/u of osteoporosis (dx'ed 2014; no secondary cause was found; she was rx'ed fosamax in 2018; she has never had bony fracture).  She did not tolerate fosamax (heartburn), even with prilosec.   Past Medical History:  Diagnosis Date  . ALLERGIC RHINITIS   . Dense breast   . DEPRESSION   . Fibroid   . GERD   . Hearing loss   . HYPERLIPIDEMIA    diet control efforts  . Osteoarthritis   . Osteopenia 02/2006  . PMB (postmenopausal bleeding) 12/2001   polyps  . URINARY INCONTINENCE    OAB  . Wisdom teeth removed 1866/1967    Past Surgical History:  Procedure Laterality Date  . BREAST CYST ASPIRATION  12/05/2001  . CATARACT EXTRACTION Bilateral 07/2012, 09/2012  . DILATION AND CURETTAGE OF UTERUS  2003  . HYSTEROSCOPY  01/2002   resection, sm fibroid  . Left knee surgey    . MENISCUS REPAIR     left knee 2010  . REFRACTIVE SURGERY  02/2016  . WISDOM TOOTH EXTRACTION      Social History   Social History  . Marital status: Married    Spouse name: N/A  . Number of children: N/A  . Years of education: N/A   Occupational History  . Not on file.   Social History Main Topics  . Smoking status: Never Smoker  . Smokeless tobacco: Never Used  . Alcohol use No  . Drug use: No  . Sexual activity: No   Other Topics Concern  . Not on file   Social History Narrative  . No narrative on file    Current Outpatient Prescriptions on File Prior to Visit  Medication Sig Dispense Refill  . alendronate (FOSAMAX) 70 MG tablet Take 1 tablet (70 mg total) by mouth once a week. Take with a full glass of water on an empty stomach. 4 tablet 11  . aspirin 81 MG tablet Take 81 mg by mouth daily.    . Azelastine-Fluticasone (DYMISTA) 137-50 MCG/ACT SUSP Place 1-2 sprays into the nose at bedtime. 23 g 11  . Calcium Citrate-Vitamin D (CITRACAL + D PO) Take 1,200 mg by mouth  daily.    Marland Kitchen conjugated estrogens (PREMARIN) vaginal cream USE 1/2 GRAM VAGINALLY TWICE WEEKLY 30 g 2  . cromolyn (NASALCROM) 5.2 MG/ACT nasal spray Place 1 spray into both nostrils 2 (two) times daily.    . famotidine (PEPCID) 10 MG tablet Take 10 mg by mouth once.     . montelukast (SINGULAIR) 10 MG tablet TAKE 1 TABLET(10 MG) BY MOUTH AT BEDTIME 30 tablet 11  . Multiple Vitamins-Minerals (CENTRUM SILVER) tablet Take 1 tablet by mouth daily.    . Omega-3 Fatty Acids (FISH OIL) 1200 MG CAPS Take by mouth daily.    Marland Kitchen omeprazole (PRILOSEC) 40 MG capsule Take 40 mg by mouth 2 (two) times a week.    . pseudoephedrine (SUDAFED) 120 MG 12 hr tablet Take 120 mg by mouth once.     . vitamin E 400 UNIT capsule Take 800 Units by mouth daily.     . Vitamins-Lipotropics (LIPO-FLAVONOID PLUS) TABS Take by mouth. Two tablets daily.     No current facility-administered medications on file prior to visit.     No Known Allergies  Family History  Problem Relation Age of Onset  . Alzheimer's  disease Mother   . Allergies Mother   . Breast cancer Mother 37  . Dementia Mother   . Lung cancer Father 58  . Allergies Brother   . Diabetes Brother   . Transient ischemic attack Maternal Grandmother   . Peripheral Artery Disease Maternal Grandmother        s/p B leg amputation  . Stroke Maternal Grandmother   . Tuberculosis Maternal Grandfather   . Diabetes Brother 47  . Alzheimer's disease Brother 58       early onset  . Sudden death Paternal Grandmother 69       unknown  . Breast cancer Maternal Aunt   . Osteoporosis Neg Hx     BP 132/82   Pulse 79   Ht 5\' 5"  (1.651 m)   Wt 131 lb (59.4 kg)   LMP 09/03/1996   SpO2 96%   BMI 21.80 kg/m   Review of Systems Denies falls.     Objective:   Physical Exam VITAL SIGNS:  See vs page.  GENERAL: no distress.  Gait: normal and steady.       Assessment & Plan:  Osteoporosis: she needs ongoing rx.   Heartburn: worse.   Patient Instructions    Please continue to take calcium 1200 mg per day, and vitamin-D, 400 units per day blood tests are requested for you today.  We'll let you know about the results. Based on the results, I'll request the "Reclast" infusion. It is critically important to prevent falling down (keep floor areas well-lit, dry, and free of loose objects.  If you have a cane, walker, or wheelchair, you should use it, even for short trips around the house.  Wear flat-soled shoes.  Also, try not to rush) Please return in 1 year.

## 2017-04-16 NOTE — Patient Instructions (Signed)
Please continue to take calcium 1200 mg per day, and vitamin-D, 400 units per day blood tests are requested for you today.  We'll let you know about the results. Based on the results, I'll request the "Reclast" infusion. It is critically important to prevent falling down (keep floor areas well-lit, dry, and free of loose objects.  If you have a cane, walker, or wheelchair, you should use it, even for short trips around the house.  Wear flat-soled shoes.  Also, try not to rush) Please return in 1 year.

## 2017-04-18 LAB — PTH, INTACT AND CALCIUM
Calcium: 9.7 mg/dL (ref 8.6–10.4)
PTH: 40 pg/mL (ref 14–64)

## 2017-04-23 NOTE — Progress Notes (Signed)
Called and left patient a detailed VM. She could call back if she had any questions.

## 2017-05-02 ENCOUNTER — Telehealth: Payer: Self-pay | Admitting: Endocrinology

## 2017-05-02 NOTE — Telephone Encounter (Signed)
Patient called in reference to infusion for osteoporosis. Patient stated this was dicussed on her last appointment on 04/16/17. Patient has been waiting for someone to call her about scheduling an appointment. Please call patient and advise. OK to leave message.

## 2017-05-02 NOTE — Telephone Encounter (Signed)
Routing to you °

## 2017-05-09 ENCOUNTER — Encounter: Payer: Self-pay | Admitting: Endocrinology

## 2017-05-09 NOTE — Telephone Encounter (Signed)
Vaughan Basta when you can please call about reclast infusion.

## 2017-05-13 ENCOUNTER — Ambulatory Visit: Payer: Medicare Other | Admitting: Obstetrics and Gynecology

## 2017-05-14 ENCOUNTER — Telehealth: Payer: Self-pay | Admitting: Nutrition

## 2017-05-14 NOTE — Telephone Encounter (Signed)
Pt. Called and appt. Scheduled for Monday at 1PM for reclast infusion.

## 2017-05-20 ENCOUNTER — Ambulatory Visit (INDEPENDENT_AMBULATORY_CARE_PROVIDER_SITE_OTHER): Payer: Medicare Other | Admitting: Endocrinology

## 2017-05-20 DIAGNOSIS — M810 Age-related osteoporosis without current pathological fracture: Secondary | ICD-10-CM

## 2017-05-20 NOTE — Patient Instructions (Signed)
Continue to drink plenty of water today Continue your Calcium and Vitamen D Call if questions

## 2017-05-20 NOTE — Progress Notes (Signed)
  Per office note on 04/16/17, and after the patient signed the consent, an IV of Normal saline was started in right arm with 20 g needle. Once the IV was determined to be patent, the Zolendronic acid, 5mg /116ml was started at 1:25 PM: until 1:55 PM. The IV was flushed with normal saline and then removed. The site showed no signes of redness or swelling. She denied any symptoms of nausea, or light headedness while the IV was infusing, or afterwards. She was encouraged to drink plenty of fluids today, and to continue the calcium and vitamen D.  She agreed to do this and had no final questions. Leonia Reader, RN

## 2017-05-24 ENCOUNTER — Encounter: Payer: Self-pay | Admitting: Obstetrics and Gynecology

## 2017-05-24 ENCOUNTER — Ambulatory Visit (INDEPENDENT_AMBULATORY_CARE_PROVIDER_SITE_OTHER): Payer: Medicare Other | Admitting: Obstetrics and Gynecology

## 2017-05-24 VITALS — BP 108/66 | HR 88 | Resp 18 | Ht 65.0 in | Wt 127.6 lb

## 2017-05-24 DIAGNOSIS — Z01419 Encounter for gynecological examination (general) (routine) without abnormal findings: Secondary | ICD-10-CM | POA: Diagnosis not present

## 2017-05-24 MED ORDER — ESTROGENS, CONJUGATED 0.625 MG/GM VA CREA: TOPICAL_CREAM | VAGINAL | 1 refills | Status: DC

## 2017-05-24 NOTE — Patient Instructions (Signed)

## 2017-05-24 NOTE — Progress Notes (Signed)
71 y.o. G0P0 Married Caucasian female here for annual exam.    Has urinary frequency and urgency, night time urination, spontaneous incontinence and loss of urine with couth/sneeze.  Off of medications.  Saw PT at [facility] and this helps.  Using Premarin vaginal cream twice a week.  Having muscle and joint pain.  Some skin itching.  Hearing loss. Tx with diuretic. Will have an ear tube placed.   Doing Reclast infusion for osteoporosis.  Reflux prevented her from taking oral meds.  Dr. Loanne Drilling ordering.   Husband with cognitive impairment. Patient getting stressed.   PCP:  Pricilla Holm, MD   Patient's last menstrual period was 09/03/1996.           Sexually active: No.  The current method of family planning is post menopausal status.    Exercising: No.   Smoker:  no  Health Maintenance: Pap: 05-10-16 Neg, 04-09-14 ASCUS:Neg HR HPV History of abnormal Pap:  Yes, 04-19-14 ASCUS:Neg HR HPV, Hx of Pos HPV in 2002? MMG: 02-08-17 Density C/Neg/BiRads1:TBC Colonoscopy:06-16-14 normal with Dr. Lenise Herald due 06/2024.  BMD: 07-11-16 Result Osteoporosis:TBC TDaP:  03/2013 Gardasil:   no HIV: No Hep C: 02-13-16 Neg Screening Labs:  Hb today: PCP, Urine today: not done   reports that she has never smoked. She has never used smokeless tobacco. She reports that she does not drink alcohol or use drugs.  Past Medical History:  Diagnosis Date  . ALLERGIC RHINITIS   . Dense breast   . DEPRESSION   . Fibroid   . GERD   . Hearing loss   . HYPERLIPIDEMIA    diet control efforts  . Osteoarthritis   . Osteoporosis   . PMB (postmenopausal bleeding) 12/2001   polyps  . URINARY INCONTINENCE    OAB  . Wisdom teeth removed 1866/1967    Past Surgical History:  Procedure Laterality Date  . BREAST CYST ASPIRATION  12/05/2001  . CATARACT EXTRACTION Bilateral 07/2012, 09/2012  . DILATION AND CURETTAGE OF UTERUS  2003  . HYSTEROSCOPY  01/2002   resection, sm fibroid  . Left knee  surgey    . MENISCUS REPAIR     left knee 2010  . REFRACTIVE SURGERY  02/2016  . WISDOM TOOTH EXTRACTION      Current Outpatient Prescriptions  Medication Sig Dispense Refill  . aspirin 81 MG tablet Take 81 mg by mouth daily.    . Azelastine-Fluticasone (DYMISTA) 137-50 MCG/ACT SUSP Place 1-2 sprays into the nose at bedtime. 23 g 11  . Calcium Citrate-Vitamin D (CITRACAL + D PO) Take 1,200 mg by mouth daily.    . Cholecalciferol (VITAMIN D3) 2000 units TABS Take 1 tablet by mouth daily.    Marland Kitchen conjugated estrogens (PREMARIN) vaginal cream USE 1/2 GRAM VAGINALLY TWICE WEEKLY 30 g 1  . cromolyn (NASALCROM) 5.2 MG/ACT nasal spray Place 1 spray into both nostrils 2 (two) times daily.    . famotidine (PEPCID) 10 MG tablet Take 10 mg by mouth once.     . montelukast (SINGULAIR) 10 MG tablet TAKE 1 TABLET(10 MG) BY MOUTH AT BEDTIME 30 tablet 11  . Multiple Vitamins-Minerals (CENTRUM SILVER) tablet Take 1 tablet by mouth daily.    . Omega-3 Fatty Acids (FISH OIL) 1200 MG CAPS Take by mouth daily.    . pseudoephedrine (SUDAFED) 120 MG 12 hr tablet Take 120 mg by mouth once.     . triamterene-hydrochlorothiazide (DYAZIDE) 37.5-25 MG capsule Take 1 capsule by mouth daily. 1    .  vitamin E 400 UNIT capsule Take 800 Units by mouth daily.     . Zoledronic Acid (RECLAST IV) Inject into the vein. IV infusion yearly     No current facility-administered medications for this visit.     Family History  Problem Relation Age of Onset  . Alzheimer's disease Mother   . Allergies Mother   . Breast cancer Mother 87  . Dementia Mother   . Lung cancer Father 29  . Allergies Brother   . Diabetes Brother   . Transient ischemic attack Maternal Grandmother   . Peripheral Artery Disease Maternal Grandmother        s/p B leg amputation  . Stroke Maternal Grandmother   . Tuberculosis Maternal Grandfather   . Diabetes Brother 5  . Alzheimer's disease Brother 59       early onset  . Sudden death Paternal  Grandmother 70       unknown  . Breast cancer Paternal Aunt   . Osteoporosis Neg Hx     ROS:  Pertinent items are noted in HPI.  Otherwise, a comprehensive ROS was negative.  Exam:   BP 108/66 (BP Location: Right Arm, Patient Position: Sitting, Cuff Size: Normal)   Pulse 88   Resp 18   Ht 5\' 5"  (1.651 m)   Wt 127 lb 9.6 oz (57.9 kg)   LMP 09/03/1996   BMI 21.23 kg/m     General appearance: alert, cooperative and appears stated age Head: Normocephalic, without obvious abnormality, atraumatic Neck: no adenopathy, supple, symmetrical, trachea midline and thyroid normal to inspection and palpation Lungs: clear to auscultation bilaterally Breasts: normal appearance, no masses or tenderness, No nipple retraction or dimpling, No nipple discharge or bleeding, No axillary or supraclavicular adenopathy Heart: regular rate and rhythm Abdomen: soft, non-tender; no masses, no organomegaly Extremities: extremities normal, atraumatic, no cyanosis or edema Skin: Skin color, texture, turgor normal. No rashes or lesions Lymph nodes: Cervical, supraclavicular, and axillary nodes normal. No abnormal inguinal nodes palpated Neurologic: Grossly normal  Pelvic: External genitalia:  no lesions              Urethra:  normal appearing urethra with no masses, tenderness or lesions              Bartholins and Skenes: normal                 Vagina: normal appearing vagina with normal color and discharge, no lesions              Cervix: no lesions              Pap taken: No. Bimanual Exam:  Uterus:  normal size, contour, position, consistency, mobility, non-tender              Adnexa: no mass, fullness, tenderness              Rectal exam: Yes.  .  Confirms.              Anus:  normal sphincter tone, no lesions  Chaperone was present for exam.  Assessment:   Well woman visit with normal exam. Overactive bladder.  Off meds. Incomplete uterovaginal prolapse. Osteoporosis.  Doing Reclast injections.   Vulvovaginal atrophy.  FH of breast cancer in mother. Situational stress.   Plan: Mammogram screening discussed. Recommended self breast awareness. Pap and HR HPV as above. Guidelines for Calcium, Vitamin D, regular exercise program including cardiovascular and weight bearing exercise. Refill of vaginal Premarin cream.  Discussed increased potential risk of breast CA. We discussed the [place] [employer] for education and support in the care of family with cognitive impairment.  Follow up annually and prn.   After visit summary provided.

## 2017-06-11 ENCOUNTER — Ambulatory Visit (INDEPENDENT_AMBULATORY_CARE_PROVIDER_SITE_OTHER): Payer: Medicare Other | Admitting: Internal Medicine

## 2017-06-11 ENCOUNTER — Other Ambulatory Visit (INDEPENDENT_AMBULATORY_CARE_PROVIDER_SITE_OTHER): Payer: Medicare Other

## 2017-06-11 ENCOUNTER — Encounter: Payer: Self-pay | Admitting: Internal Medicine

## 2017-06-11 VITALS — BP 112/70 | HR 77 | Temp 97.5°F | Ht 65.0 in | Wt 130.0 lb

## 2017-06-11 DIAGNOSIS — Z0000 Encounter for general adult medical examination without abnormal findings: Secondary | ICD-10-CM

## 2017-06-11 DIAGNOSIS — R7989 Other specified abnormal findings of blood chemistry: Secondary | ICD-10-CM

## 2017-06-11 DIAGNOSIS — M810 Age-related osteoporosis without current pathological fracture: Secondary | ICD-10-CM | POA: Diagnosis not present

## 2017-06-11 DIAGNOSIS — E789 Disorder of lipoprotein metabolism, unspecified: Secondary | ICD-10-CM | POA: Diagnosis not present

## 2017-06-11 DIAGNOSIS — K219 Gastro-esophageal reflux disease without esophagitis: Secondary | ICD-10-CM | POA: Diagnosis not present

## 2017-06-11 LAB — T4, FREE: Free T4: 0.9 ng/dL (ref 0.60–1.60)

## 2017-06-11 LAB — LIPID PANEL
Cholesterol: 243 mg/dL — ABNORMAL HIGH (ref 0–200)
HDL: 68.4 mg/dL (ref >39.00)
LDL Cholesterol: 150 mg/dL — ABNORMAL HIGH (ref 0–99)
NonHDL: 175
Total CHOL/HDL Ratio: 4
Triglycerides: 124 mg/dL (ref 0.0–149.0)
VLDL: 24.8 mg/dL (ref 0.0–40.0)

## 2017-06-11 LAB — TSH: TSH: 6.32 u[IU]/mL — ABNORMAL HIGH (ref 0.35–4.50)

## 2017-06-11 NOTE — Progress Notes (Signed)
   Subjective:    Patient ID: Mary Malone, female    DOB: [birth year], 71 y.o.   MRN: [id number]  HPI Here for medicare wellness, no new complaints. Please see A/P for status and treatment of chronic medical problems.   Diet: heart healthy Physical activity: sedentary Depression/mood screen: negative Hearing: intact to whispered voice, mild loss currently seeing ENT for treatment Visual acuity: grossly normal with lens, performs annual eye exam  ADLs: capable Fall risk: none Home safety: good Cognitive evaluation: intact to orientation, naming, recall and repetition EOL planning: adv directives discussed  I have personally reviewed and have noted 1. The patient's medical and social history - reviewed today no changes 2. Their use of alcohol, tobacco or illicit drugs 3. Their current medications and supplements 4. The patient's functional ability including ADL's, fall risks, home safety risks and hearing or visual impairment. 5. Diet and physical activities 6. Evidence for depression or mood disorders 7. Care team reviewed and updated (available in snapshot)  Review of Systems  Constitutional: Negative.   HENT: Negative.   Eyes: Negative.   Respiratory: Negative for cough, chest tightness and shortness of breath.   Cardiovascular: Negative for chest pain, palpitations and leg swelling.  Gastrointestinal: Negative for abdominal distention, abdominal pain, constipation, diarrhea, nausea and vomiting.  Musculoskeletal: Negative.   Skin: Negative.   Neurological: Negative.   Psychiatric/Behavioral: Negative.       Objective:   Physical Exam  Constitutional: She is oriented to person, place, and time. She appears well-developed and well-nourished.  HENT:  Head: Normocephalic and atraumatic.  Eyes: EOM are normal.  Neck: Normal range of motion.  Cardiovascular: Normal rate and regular rhythm.   Pulmonary/Chest: Effort normal and breath sounds normal. No respiratory distress.  She has no wheezes. She has no rales.  Abdominal: Soft. Bowel sounds are normal. She exhibits no distension. There is no tenderness. There is no rebound.  Musculoskeletal: She exhibits no edema.  Neurological: She is alert and oriented to person, place, and time. Coordination normal.  Skin: Skin is warm and dry.  Psychiatric: She has a normal mood and affect.   Vitals:   06/11/17 1326  BP: 112/70  Pulse: 77  Temp: (!) 97.5 F (36.4 C)  TempSrc: Oral  SpO2: 98%  Weight: 130 lb (59 kg)  Height: 5\' 5"  (1.651 m)      Assessment & Plan:

## 2017-06-11 NOTE — Assessment & Plan Note (Signed)
Checking labs, mammogram up to date, colonoscopy due in 2024. Counseled about shingrix. Flu shot declined as she will get next month. Pneumonia and tetanus up to date. Counseled about sun safety and mole surveillance. Given 10 year screening recommendations.

## 2017-06-11 NOTE — Assessment & Plan Note (Signed)
Taking reclast started 2018, needs bone density 2020 for efficacy.

## 2017-06-11 NOTE — Patient Instructions (Addendum)
We will check the labs today.   Ask about the shingrix when you get the flu shot.   Health Maintenance, Female Adopting a healthy lifestyle and getting preventive care can go a long way to promote health and wellness. Talk with your health care provider about what schedule of regular examinations is right for you. This is a good chance for you to check in with your provider about disease prevention and staying healthy. In between checkups, there are plenty of things you can do on your own. Experts have done a lot of research about which lifestyle changes and preventive measures are most likely to keep you healthy. Ask your health care provider for more information. Weight and diet Eat a healthy diet  Be sure to include plenty of vegetables, fruits, low-fat dairy products, and lean protein.  Do not eat a lot of foods high in solid fats, added sugars, or salt.  Get regular exercise. This is one of the most important things you can do for your health. ? Most adults should exercise for at least 150 minutes each week. The exercise should increase your heart rate and make you sweat (moderate-intensity exercise). ? Most adults should also do strengthening exercises at least twice a week. This is in addition to the moderate-intensity exercise.  Maintain a healthy weight  Body mass index (BMI) is a measurement that can be used to identify possible weight problems. It estimates body fat based on height and weight. Your health care provider can help determine your BMI and help you achieve or maintain a healthy weight.  For females 41 years of age and older: ? A BMI below 18.5 is considered underweight. ? A BMI of 18.5 to 24.9 is normal. ? A BMI of 25 to 29.9 is considered overweight. ? A BMI of 30 and above is considered obese.  Watch levels of cholesterol and blood lipids  You should start having your blood tested for lipids and cholesterol at 70 years of age, then have this test every 5  years.  You may need to have your cholesterol levels checked more often if: ? Your lipid or cholesterol levels are high. ? You are older than 70 years of age. ? You are at high risk for heart disease.  Cancer screening Lung Cancer  Lung cancer screening is recommended for adults 12-32 years old who are at high risk for lung cancer because of a history of smoking.  A yearly low-dose CT scan of the lungs is recommended for people who: ? Currently smoke. ? Have quit within the past 15 years. ? Have at least a 30-pack-year history of smoking. A pack year is smoking an average of one pack of cigarettes a day for 1 year.  Yearly screening should continue until it has been 15 years since you quit.  Yearly screening should stop if you develop a health problem that would prevent you from having lung cancer treatment.  Breast Cancer  Practice breast self-awareness. This means understanding how your breasts normally appear and feel.  It also means doing regular breast self-exams. Let your health care provider know about any changes, no matter how small.  If you are in your 20s or 30s, you should have a clinical breast exam (CBE) by a health care provider every 1-3 years as part of a regular health exam.  If you are 22 or older, have a CBE every year. Also consider having a breast X-ray (mammogram) every year.  If you have a family  history of breast cancer, talk to your health care provider about genetic screening.  If you are at high risk for breast cancer, talk to your health care provider about having an MRI and a mammogram every year.  Breast cancer gene (BRCA) assessment is recommended for women who have family members with BRCA-related cancers. BRCA-related cancers include: ? Breast. ? Ovarian. ? Tubal. ? Peritoneal cancers.  Results of the assessment will determine the need for genetic counseling and BRCA1 and BRCA2 testing.  Cervical Cancer Your health care provider may  recommend that you be screened regularly for cancer of the pelvic organs (ovaries, uterus, and vagina). This screening involves a pelvic examination, including checking for microscopic changes to the surface of your cervix (Pap test). You may be encouraged to have this screening done every 3 years, beginning at age 83.  For women ages 16-65, health care providers may recommend pelvic exams and Pap testing every 3 years, or they may recommend the Pap and pelvic exam, combined with testing for human papilloma virus (HPV), every 5 years. Some types of HPV increase your risk of cervical cancer. Testing for HPV may also be done on women of any age with unclear Pap test results.  Other health care providers may not recommend any screening for nonpregnant women who are considered low risk for pelvic cancer and who do not have symptoms. Ask your health care provider if a screening pelvic exam is right for you.  If you have had past treatment for cervical cancer or a condition that could lead to cancer, you need Pap tests and screening for cancer for at least 20 years after your treatment. If Pap tests have been discontinued, your risk factors (such as having a new sexual partner) need to be reassessed to determine if screening should resume. Some women have medical problems that increase the chance of getting cervical cancer. In these cases, your health care provider may recommend more frequent screening and Pap tests.  Colorectal Cancer  This type of cancer can be detected and often prevented.  Routine colorectal cancer screening usually begins at 70 years of age and continues through 70 years of age.  Your health care provider may recommend screening at an earlier age if you have risk factors for colon cancer.  Your health care provider may also recommend using home test kits to check for hidden blood in the stool.  A small camera at the end of a tube can be used to examine your colon directly  (sigmoidoscopy or colonoscopy). This is done to check for the earliest forms of colorectal cancer.  Routine screening usually begins at age 43.  Direct examination of the colon should be repeated every 5-10 years through 70 years of age. However, you may need to be screened more often if early forms of precancerous polyps or small growths are found.  Skin Cancer  Check your skin from head to toe regularly.  Tell your health care provider about any new moles or changes in moles, especially if there is a change in a mole's shape or color.  Also tell your health care provider if you have a mole that is larger than the size of a pencil eraser.  Always use sunscreen. Apply sunscreen liberally and repeatedly throughout the day.  Protect yourself by wearing long sleeves, pants, a wide-brimmed hat, and sunglasses whenever you are outside.  Heart disease, diabetes, and high blood pressure  High blood pressure causes heart disease and increases the risk of stroke.  High blood pressure is more likely to develop in: ? People who have blood pressure in the high end of the normal range (130-139/85-89 mm Hg). ? People who are overweight or obese. ? People who are African American.  If you are 48-73 years of age, have your blood pressure checked every 3-5 years. If you are 45 years of age or older, have your blood pressure checked every year. You should have your blood pressure measured twice-once when you are at a hospital or clinic, and once when you are not at a hospital or clinic. Record the average of the two measurements. To check your blood pressure when you are not at a hospital or clinic, you can use: ? An automated blood pressure machine at a pharmacy. ? A home blood pressure monitor.  If you are between 11 years and 45 years old, ask your health care provider if you should take aspirin to prevent strokes.  Have regular diabetes screenings. This involves taking a blood sample to check your  fasting blood sugar level. ? If you are at a normal weight and have a low risk for diabetes, have this test once every three years after 70 years of age. ? If you are overweight and have a high risk for diabetes, consider being tested at a younger age or more often. Preventing infection Hepatitis B  If you have a higher risk for hepatitis B, you should be screened for this virus. You are considered at high risk for hepatitis B if: ? You were born in a country where hepatitis B is common. Ask your health care provider which countries are considered high risk. ? Your parents were born in a high-risk country, and you have not been immunized against hepatitis B (hepatitis B vaccine). ? You have HIV or AIDS. ? You use needles to inject street drugs. ? You live with someone who has hepatitis B. ? You have had sex with someone who has hepatitis B. ? You get hemodialysis treatment. ? You take certain medicines for conditions, including cancer, organ transplantation, and autoimmune conditions.  Hepatitis C  Blood testing is recommended for: ? Everyone born from [birth year] through 1964. ? Anyone with known risk factors for hepatitis C.  Sexually transmitted infections (STIs)  You should be screened for sexually transmitted infections (STIs) including gonorrhea and chlamydia if: ? You are sexually active and are younger than 70 years of age. ? You are older than 70 years of age and your health care provider tells you that you are at risk for this type of infection. ? Your sexual activity has changed since you were last screened and you are at an increased risk for chlamydia or gonorrhea. Ask your health care provider if you are at risk.  If you do not have HIV, but are at risk, it may be recommended that you take a prescription medicine daily to prevent HIV infection. This is called pre-exposure prophylaxis (PrEP). You are considered at risk if: ? You are sexually active and do not regularly use condoms  or know the HIV status of your partner(s). ? You take drugs by injection. ? You are sexually active with a partner who has HIV.  Talk with your health care provider about whether you are at high risk of being infected with HIV. If you choose to begin PrEP, you should first be tested for HIV. You should then be tested every 3 months for as long as you are taking PrEP. Pregnancy  If you  are premenopausal and you may become pregnant, ask your health care provider about preconception counseling.  If you may become pregnant, take 400 to 800 micrograms (mcg) of folic acid every day.  If you want to prevent pregnancy, talk to your health care provider about birth control (contraception). Osteoporosis and menopause  Osteoporosis is a disease in which the bones lose minerals and strength with aging. This can result in serious bone fractures. Your risk for osteoporosis can be identified using a bone density scan.  If you are 76 years of age or older, or if you are at risk for osteoporosis and fractures, ask your health care provider if you should be screened.  Ask your health care provider whether you should take a calcium or vitamin D supplement to lower your risk for osteoporosis.  Menopause may have certain physical symptoms and risks.  Hormone replacement therapy may reduce some of these symptoms and risks. Talk to your health care provider about whether hormone replacement therapy is right for you. Follow these instructions at home:  Schedule regular health, dental, and eye exams.  Stay current with your immunizations.  Do not use any tobacco products including cigarettes, chewing tobacco, or electronic cigarettes.  If you are pregnant, do not drink alcohol.  If you are breastfeeding, limit how much and how often you drink alcohol.  Limit alcohol intake to no more than 1 drink per day for nonpregnant women. One drink equals 12 ounces of beer, 5 ounces of wine, or 1 ounces of hard  liquor.  Do not use street drugs.  Do not share needles.  Ask your health care provider for help if you need support or information about quitting drugs.  Tell your health care provider if you often feel depressed.  Tell your health care provider if you have ever been abused or do not feel safe at home. This information is not intended to replace advice given to you by your health care provider. Make sure you discuss any questions you have with your health care provider. Document Released: 03/05/2011 Document Revised: 01/26/2016 Document Reviewed: 05/24/2015 Elsevier Interactive Patient Education  [employer].

## 2017-06-11 NOTE — Assessment & Plan Note (Signed)
Taking pepcid and doing fairly well. Some flare with fosamax which is not settled. Not using tums or maalox as symptoms are not that bad.

## 2017-08-07 ENCOUNTER — Ambulatory Visit: Payer: Medicare Other | Admitting: Endocrinology

## 2017-10-14 ENCOUNTER — Other Ambulatory Visit: Payer: Self-pay | Admitting: Internal Medicine

## 2017-11-25 ENCOUNTER — Encounter: Payer: Self-pay | Admitting: Internal Medicine

## 2017-11-25 MED ORDER — MONTELUKAST SODIUM 10 MG PO TABS: 10.0000 mg | ORAL_TABLET | Freq: Every day | ORAL | 2 refills | Status: DC

## 2018-01-01 ENCOUNTER — Other Ambulatory Visit: Payer: Self-pay | Admitting: Internal Medicine

## 2018-01-01 DIAGNOSIS — Z1231 Encounter for screening mammogram for malignant neoplasm of breast: Secondary | ICD-10-CM

## 2018-02-10 ENCOUNTER — Ambulatory Visit
Admission: RE | Admit: 2018-02-10 | Discharge: 2018-02-10 | Disposition: A | Discharge status: Home / Self Care | Payer: Medicare Other | Source: Ambulatory Visit | Attending: Internal Medicine | Admitting: Internal Medicine

## 2018-02-10 DIAGNOSIS — Z1231 Encounter for screening mammogram for malignant neoplasm of breast: Secondary | ICD-10-CM

## 2018-02-19 ENCOUNTER — Encounter: Payer: Self-pay | Admitting: Internal Medicine

## 2018-02-19 ENCOUNTER — Ambulatory Visit (INDEPENDENT_AMBULATORY_CARE_PROVIDER_SITE_OTHER): Payer: Medicare Other | Admitting: Internal Medicine

## 2018-02-19 DIAGNOSIS — R911 Solitary pulmonary nodule: Secondary | ICD-10-CM

## 2018-02-19 DIAGNOSIS — J302 Other seasonal allergic rhinitis: Secondary | ICD-10-CM | POA: Diagnosis not present

## 2018-02-19 DIAGNOSIS — J3089 Other allergic rhinitis: Secondary | ICD-10-CM

## 2018-02-19 DIAGNOSIS — H918X3 Other specified hearing loss, bilateral: Secondary | ICD-10-CM

## 2018-02-19 MED ORDER — AZELASTINE-FLUTICASONE 137-50 MCG/ACT NA SUSP: 1.0000 | Freq: Every day | NASAL | 11 refills | Status: DC

## 2018-02-19 MED ORDER — MONTELUKAST SODIUM 10 MG PO TABS: 10.0000 mg | ORAL_TABLET | Freq: Every day | ORAL | 12 refills | Status: DC

## 2018-02-19 NOTE — Progress Notes (Signed)
HPI female never smoker (widow-husband died of lung cancer) followed for allergic rhinitis, complicated by GERD IgE total 02/20/16- 7 EOS 0.2 Allergy Profile 05/12/15- negative with minor elevation only for cat dander, total IgE 12 ---------------------------------------------------------------------------------------------------------------------  02/19/17- 72 year old female never smoker (widow-husband died of lung cancer) followed for allergic rhinitis, complicated by GERD Follows for, Pt states still having issues with mucus. Had evaluation for decreased hearing at [facility]. Now asks my referral to local ENT, interested in hearing aids. Continues Nasalcrom which she gets on line. Still complains of a postnasal drip sensation when she lies down at night, such that she tries to sleep with head elevated. Head CT at no 5 in 2016 mentioned a 3 mm right upper lobe nodule. She asked about checking that. She got a My Chart message about needing pneumonia vaccine. She had Prevnar 13 in 2016, Pneumovax in 2010. One more Pneumovax would be appropriate. She wouldn't need more after that.  02/19/2018- 72 year old female never smoker (widow-husband died of lung cancer) followed for lung nodule, allergic rhinitis, complicated by GERD, hearing loss ----- Pt states she is doing well overall other than the usual congestion she has.  Nasalcrom, Singulair, Dymista Sleeps better with Dymista and Nasalcrom.  Feels L nostril sags closed lying down- pulls cheek laterally to open.  Chest ok.  Myringotomy tube R- working with Dr Thornell Mule, pending hearing aid. CT chest 02/27/2017 Negative.  No evidence of neoplasm or other active disease.  ROS-see HPI  + = positive Constitutional:   No-   weight loss, night sweats, fevers, chills, fatigue, lassitude. HEENT:   No-  headaches, difficulty swallowing, tooth/dental problems, sore throat,        sneezing, itching, ear ache, nasal congestion, +post nasal drip,  CV:  No-   chest  pain, orthopnea, PND, swelling in lower extremities, anasarca,  dizziness, palpitations Resp: No-   shortness of breath with exertion or at rest.              No-   productive cough,   non-productive cough,  No- coughing up of blood.              No-   change in color of mucus.  No- wheezing.   Skin: No-   rash or lesions. GI:    heartburn, indigestion,no- abdominal pain, nausea, vomiting,  GU:  MS:  No-   joint pain or swelling.   Neuro-     nothing unusual Psych:  No- change in mood or affect. No depression or anxiety.  No memory loss.  OBJ- Physical Exam General- Alert, Oriented, Affect-appropriate, Distress- none acute, trim Skin- rash-none, lesions- none, excoriation- none Lymphadenopathy- none Head- atraumatic            Eyes- Gross vision intact, PERRLA, conjunctivae and secretions clear            Ears- no problem with face-to-face conversation in quiet exam room. +R tube.             Nose- clear, no-Septal dev, mucus, polyps, erosion, perforation             Throat- Mallampati IV , mucosa clear , drainage- none, tonsils- atrophic Neck- flexible , trachea midline, no stridor , thyroid nl, carotid no bruit Chest - symmetrical excursion , unlabored           Heart/CV- RRR , no murmur , no gallop  , no rub, nl s1 s2                           -  JVD- none , edema- none, stasis changes- none, varices- none           Lung- clear to P&A, wheeze- none, cough- none , dullness-none, rub- none           Chest wall-  Abd-  Br/ Gen/ Rectal- Not done, not indicated Extrem- cyanosis- none, clubbing, none, atrophy- none, strength- nl Neuro- grossly intact to observation

## 2018-02-19 NOTE — Patient Instructions (Signed)
Refill scripts sent for Dymista and Singulair  Consider trying otc Breathe Right nasal Strips to hold your nose open better at night  Please call if we can help

## 2018-02-25 NOTE — Assessment & Plan Note (Signed)
She likes results working with both Nasalcrom and Dymista as needed Plan- try adding Breathe Right nasal strip at bedtime for improved patency

## 2018-02-25 NOTE — Assessment & Plan Note (Signed)
Continues working with ENT

## 2018-02-25 NOTE — Assessment & Plan Note (Signed)
Negative CT- will resolve this issue

## 2018-04-16 ENCOUNTER — Encounter: Payer: Self-pay | Admitting: Endocrinology

## 2018-04-16 ENCOUNTER — Other Ambulatory Visit: Payer: Self-pay | Admitting: Endocrinology

## 2018-04-16 ENCOUNTER — Ambulatory Visit
Admission: RE | Admit: 2018-04-16 | Discharge: 2018-04-16 | Disposition: A | Discharge status: Home / Self Care | Payer: Medicare Other | Source: Ambulatory Visit | Attending: Endocrinology | Admitting: Endocrinology

## 2018-04-16 ENCOUNTER — Ambulatory Visit (INDEPENDENT_AMBULATORY_CARE_PROVIDER_SITE_OTHER): Payer: Medicare Other | Admitting: Endocrinology

## 2018-04-16 VITALS — BP 132/78 | HR 98 | Ht 68.0 in | Wt 129.8 lb

## 2018-04-16 DIAGNOSIS — M5489 Other dorsalgia: Secondary | ICD-10-CM

## 2018-04-16 DIAGNOSIS — M549 Dorsalgia, unspecified: Secondary | ICD-10-CM | POA: Insufficient documentation

## 2018-04-16 DIAGNOSIS — M810 Age-related osteoporosis without current pathological fracture: Secondary | ICD-10-CM | POA: Diagnosis not present

## 2018-04-16 DIAGNOSIS — M533 Sacrococcygeal disorders, not elsewhere classified: Secondary | ICD-10-CM | POA: Insufficient documentation

## 2018-04-16 LAB — VITAMIN D 25 HYDROXY (VIT D DEFICIENCY, FRACTURES): VITD: 47.51 ng/mL (ref 30.00–100.00)

## 2018-04-16 NOTE — Patient Instructions (Addendum)
Please continue to take calcium 1200 mg per day, and vitamin-D, 400 units per day.  blood tests are requested for you today.  We'll let you know about the results.  I'll request another "Reclast" infusion.  you will receive a phone call, about a day and time for an appointment It is critically important to prevent falling down (keep floor areas well-lit, dry, and free of loose objects.  If you have a cane, walker, or wheelchair, you should use it, even for short trips around the house.  Wear flat-soled shoes.  Also, try not to rush). You will be due for another bone density in November.  If it is still low, you should consider adding "Prolia" (twice a year injection, given here at the office).   Please return in 1 year.

## 2018-04-16 NOTE — Progress Notes (Signed)
Subjective:    Patient ID: Mary Malone, female    DOB: Jun 15, 1946, 72 y.o.   MRN: [id number]  HPI Pt returns for f/u of osteoporosis: Dx'ed: 2014 Secondary cause: none found Fractures: never Past rx: fosamax (stopped due to heartburn, even with prilosec) Current rx: reclast since 2018.  Last DEXA result (2017): worse T-score was -4.1 (right hip).   Other:  Interval hx: pt states 6 mos of moderate low and mid-back pain.  No assoc numbness Past Medical History:  Diagnosis Date  . ALLERGIC RHINITIS   . Dense breast   . DEPRESSION   . Fibroid   . GERD   . Hearing loss   . HYPERLIPIDEMIA    diet control efforts  . Osteoarthritis   . Osteoporosis   . PMB (postmenopausal bleeding) 12/2001   polyps  . URINARY INCONTINENCE    [facility]  . Wisdom teeth removed 1866/1967    Past Surgical History:  Procedure Laterality Date  . BREAST CYST ASPIRATION  12/05/2001  . CATARACT EXTRACTION Bilateral 07/2012, 09/2012  . DILATION AND CURETTAGE OF UTERUS  2003  . HYSTEROSCOPY  01/2002   resection, sm fibroid  . Left knee surgey    . MENISCUS REPAIR     left knee 2010  . REFRACTIVE SURGERY  02/2016  . WISDOM TOOTH EXTRACTION      Social History   Socioeconomic History  . Marital status: Married    Spouse name: Not on file  . Number of children: Not on file  . Years of education: Not on file  . Highest education level: Not on file  Occupational History  . Not on file  Social Needs  . Financial resource strain: Not on file  . Food insecurity:    Worry: Not on file    Inability: Not on file  . Transportation needs:    Medical: Not on file    Non-medical: Not on file  Tobacco Use  . Smoking status: Never Smoker  . Smokeless tobacco: Never Used  Substance and Sexual Activity  . Alcohol use: No    Alcohol/week: 1.0 standard drinks    Types: 1 Standard drinks or equivalent per week  . Drug use: No  . Sexual activity: Never    Partners: Male    Birth control/protection:  Post-menopausal  Lifestyle  . Physical activity:    Days per week: Not on file    Minutes per session: Not on file  . Stress: Not on file  Relationships  . Social connections:    Talks on phone: Not on file    Gets together: Not on file    Attends religious service: Not on file    Active member of club or organization: Not on file    Attends meetings of clubs or organizations: Not on file    Relationship status: Not on file  . Intimate partner violence:    Fear of current or ex partner: Not on file    Emotionally abused: Not on file    Physically abused: Not on file    Forced sexual activity: Not on file  Other Topics Concern  . Not on file  Social History Narrative  . Not on file    Current Outpatient Medications on File Prior to Visit  Medication Sig Dispense Refill  . aspirin 81 MG tablet Take 81 mg by mouth daily.    . Azelastine-Fluticasone (DYMISTA) 137-50 MCG/ACT SUSP Place 1-2 sprays into the nose at bedtime. 23 g 11  .  Calcium Citrate-Vitamin D (CITRACAL + D PO) Take 1,200 mg by mouth daily.    . Cholecalciferol (VITAMIN D3) 2000 units TABS Take 1 tablet by mouth daily.    Marland Kitchen conjugated estrogens (PREMARIN) vaginal cream USE 1/2 GRAM VAGINALLY TWICE WEEKLY 30 g 1  . cromolyn (NASALCROM) 5.2 MG/ACT nasal spray Place 1 spray into both nostrils 2 (two) times daily.    . famotidine (PEPCID) 10 MG tablet Take 10 mg by mouth once.     . montelukast (SINGULAIR) 10 MG tablet Take 1 tablet (10 mg total) by mouth at bedtime. 30 tablet 12  . Multiple Vitamins-Minerals (CENTRUM SILVER) tablet Take 1 tablet by mouth daily.    . Omega-3 Fatty Acids (FISH OIL) 1200 MG CAPS Take by mouth daily.    . pseudoephedrine (SUDAFED) 120 MG 12 hr tablet Take 120 mg by mouth once.     . triamterene-hydrochlorothiazide (DYAZIDE) 37.5-25 MG capsule Take 1 capsule by mouth daily. 1    . vitamin E 400 UNIT capsule Take 800 Units by mouth daily.     . Zoledronic Acid (RECLAST IV) Inject into the  vein. IV infusion yearly     No current facility-administered medications on file prior to visit.     No Known Allergies  Family History  Problem Relation Age of Onset  . Alzheimer's disease Mother   . Allergies Mother   . Breast cancer Mother 38  . Dementia Mother   . Lung cancer Father 105  . Allergies Brother   . Diabetes Brother   . Transient ischemic attack Maternal Grandmother   . Peripheral Artery Disease Maternal Grandmother        s/p B leg amputation  . Stroke Maternal Grandmother   . Tuberculosis Maternal Grandfather   . Diabetes Brother 55  . Alzheimer's disease Brother 98       early onset  . Sudden death Paternal Grandmother 47       unknown  . Breast cancer Paternal Aunt   . Osteoporosis Neg Hx     BP 132/78 (BP Location: Right Arm, Patient Position: Sitting, Cuff Size: Normal)   Pulse 98   Ht 5\' 8"  (1.727 m)   Wt 129 lb 12.8 oz (58.9 kg)   LMP 09/03/1996   SpO2 98%   BMI 19.74 kg/m    Review of Systems Denies difficulty with gait, and falls    Objective:   Physical Exam VITAL SIGNS:  See vs page GENERAL: no distress Spine: no kyphosis Gait: normal and steady      Assessment & Plan:  Osteoporosis: due for another dose of reclast. Back pain, new: we need to check x-rays, for poss comp fxs.  Patient Instructions  Please continue to take calcium 1200 mg per day, and vitamin-D, 400 units per day.  blood tests are requested for you today.  We'll let you know about the results.  I'll request another "Reclast" infusion.  you will receive a phone call, about a day and time for an appointment It is critically important to prevent falling down (keep floor areas well-lit, dry, and free of loose objects.  If you have a cane, walker, or wheelchair, you should use it, even for short trips around the house.  Wear flat-soled shoes.  Also, try not to rush). You will be due for another bone density in November.  If it is still low, you should consider adding  "Prolia" (twice a year injection, given here at the office).   Please return  in 1 year.

## 2018-04-17 LAB — PTH, INTACT AND CALCIUM
Calcium: 10 mg/dL (ref 8.6–10.4)
PTH: 30 pg/mL (ref 14–64)

## 2018-05-27 ENCOUNTER — Ambulatory Visit (INDEPENDENT_AMBULATORY_CARE_PROVIDER_SITE_OTHER): Payer: Medicare Other | Admitting: Endocrinology

## 2018-05-27 ENCOUNTER — Telehealth: Payer: Self-pay | Admitting: Endocrinology

## 2018-05-27 DIAGNOSIS — M810 Age-related osteoporosis without current pathological fracture: Secondary | ICD-10-CM

## 2018-05-27 NOTE — Progress Notes (Signed)
After signing the consent form, and an IV was started with a 20G catheter into her left arm.Normal saline was infused into her left arm for 5 min., until it was determined that the IV was good, Reclast was then started at 2:30PM and infused until 3PM.  Normal saline was infused to flush the tubing, then the catheter was removed.  Patient reported no discomfort during the procedure, and site showed no signes of redness or swelling. Patient was encouraged to continue to drink 4  8 ounce glasses of water today, and to continue taking her calcium and vitamin D

## 2018-05-27 NOTE — Telephone Encounter (Signed)
-----   Message from Ocie Doyne, RN sent at 05/27/2018  2:36 PM EDT ----- Mary Malone is here today having a reclast infusion.  She has said that you said you wanted to do something more than the reclast.  That you were considering prolia, or some other injection.  She wants to know if that should be started right away, or at a later time.

## 2018-05-27 NOTE — Telephone Encounter (Signed)
This is from pt's avs: You will be due for another bone density in November.  If it is still low, you should consider adding "Prolia" (twice a year injection, given here at the office).  Just reclast for now--we'll add Prolia if necessary.

## 2018-05-28 NOTE — Telephone Encounter (Signed)
Not sure if this was sent to you also

## 2018-05-28 NOTE — Progress Notes (Signed)
72 y.o. G0P0 Married Caucasian female here for annual exam.    Using vaginal estrogen cream.  No more urinary tract infections since starting this.   Two episodes of urinary incontinence this year.  Off meds for overactive bladder.   Has compression fractures of spine.   Husband with memory issues.  They are moving into a town home.   PCP:   Pricilla Holm MD   Patient's last menstrual period was 09/03/1996.           Sexually active: No.  The current method of family planning is post menopausal status.    Exercising: No.  none Smoker:  no  Health Maintenance: Pap:  05/10/16 Neg 04-09-14 ASCUS:Neg HR HPV History of abnormal Pap:   Yes, 04-19-14 ASCUS:Neg HR HPV, Hx of Pos HPV in 2002? MMG:  02/10/18 Bi-rads 1 Density C Colonoscopy:06-16-14 normal with Dr. Lenise Herald due 06/2024.    BMD:   07-11-16  Result  Osteoporosis.  Dr. Loanne Drilling managing.   Doing Reclast.   TDaP:  03/2013 Gardasil:   no HIV:  ---- Hep C: --- Screening Labs:  PCP.    reports that she has never smoked. She has never used smokeless tobacco. She reports that she does not drink alcohol or use drugs.  Past Medical History:  Diagnosis Date  . ALLERGIC RHINITIS   . Back pain   . Dense breast   . DEPRESSION   . Fibroid   . GERD   . Hearing loss   . HYPERLIPIDEMIA    diet control efforts  . Osteoarthritis   . Osteoporosis   . PMB (postmenopausal bleeding) 12/2001   polyps  . URINARY INCONTINENCE    OAB  . Wisdom teeth removed 1866/1967    Past Surgical History:  Procedure Laterality Date  . BREAST CYST ASPIRATION  12/05/2001  . CATARACT EXTRACTION Bilateral 07/2012, 09/2012  . DILATION AND CURETTAGE OF UTERUS  2003  . HYSTEROSCOPY  01/2002   resection, sm fibroid  . Left knee surgey    . MENISCUS REPAIR     left knee 2010  . REFRACTIVE SURGERY  02/2016  . WISDOM TOOTH EXTRACTION      Current Outpatient Medications  Medication Sig Dispense Refill  . Azelastine-Fluticasone (DYMISTA) 137-50  MCG/ACT SUSP Place 1-2 sprays into the nose at bedtime. 23 g 11  . Calcium Citrate-Vitamin D (CITRACAL + D PO) Take 1,200 mg by mouth daily.    . Cholecalciferol (VITAMIN D3) 2000 units TABS Take 1 tablet by mouth daily.    Marland Kitchen conjugated estrogens (PREMARIN) vaginal cream USE 1/2 GRAM VAGINALLY TWICE WEEKLY 30 g 1  . cromolyn (NASALCROM) 5.2 MG/ACT nasal spray Place 1 spray into both nostrils 2 (two) times daily.    . famotidine (PEPCID) 10 MG tablet Take 10 mg by mouth once.     . montelukast (SINGULAIR) 10 MG tablet Take 1 tablet (10 mg total) by mouth at bedtime. 30 tablet 12  . Multiple Vitamins-Minerals (CENTRUM SILVER) tablet Take 1 tablet by mouth daily.    . Omega-3 Fatty Acids (FISH OIL) 1200 MG CAPS Take by mouth daily.    . pseudoephedrine (SUDAFED) 120 MG 12 hr tablet Take 120 mg by mouth once.     . triamterene-hydrochlorothiazide (DYAZIDE) 37.5-25 MG capsule Take 1 capsule by mouth daily. 1    . vitamin E 400 UNIT capsule Take 800 Units by mouth daily.     . Zoledronic Acid (RECLAST IV) Inject into the vein. IV infusion yearly    .  aspirin 81 MG tablet Take 81 mg by mouth daily.     No current facility-administered medications for this visit.     Family History  Problem Relation Age of Onset  . Alzheimer's disease Mother   . Allergies Mother   . Breast cancer Mother 14  . Dementia Mother   . Lung cancer Father 62  . Allergies Brother   . Diabetes Brother   . Transient ischemic attack Maternal Grandmother   . Peripheral Artery Disease Maternal Grandmother        s/p B leg amputation  . Stroke Maternal Grandmother   . Tuberculosis Maternal Grandfather   . Diabetes Brother 47  . Alzheimer's disease Brother 84       early onset  . Sudden death Paternal Grandmother 27       unknown  . Breast cancer Paternal Aunt   . Osteoporosis Neg Hx     Review of Systems  HENT:       Hearing loss   Genitourinary: Positive for frequency and urgency.  Musculoskeletal: Positive  for back pain.       Muscle and joint pain   Skin:       Itching   All other systems reviewed and are negative.   Exam:   BP 110/64   Pulse 62   Resp 14   Ht 5' 4.5" (1.638 m)   Wt 128 lb (58.1 kg)   LMP 09/03/1996   BMI 21.63 kg/m     General appearance: alert, cooperative and appears stated age Head: Normocephalic, without obvious abnormality, atraumatic Neck: no adenopathy, supple, symmetrical, trachea midline and thyroid normal to inspection and palpation Lungs: clear to auscultation bilaterally Breasts: normal appearance, no masses or tenderness, No nipple retraction or dimpling, No nipple discharge or bleeding, No axillary or supraclavicular adenopathy Heart: regular rate and rhythm Abdomen: soft, non-tender; no masses, no organomegaly Extremities: extremities normal, atraumatic, no cyanosis or edema Skin: Skin color, texture, turgor normal. No rashes or lesions Lymph nodes: Cervical, supraclavicular, and axillary nodes normal. No abnormal inguinal nodes palpated Neurologic: Grossly normal  Pelvic: External genitalia:  no lesions              Urethra:  normal appearing urethra with no masses, tenderness or lesions              Bartholins and Skenes: normal                 Vagina: normal appearing vagina with normal color and discharge, no lesions              Cervix: no lesions              Pap taken: Yes.   Bimanual Exam:  Uterus:  normal size, contour, position, consistency, mobility, non-tender              Adnexa: no mass, fullness, tenderness              Rectal exam: Yes.  .  Confirms.              Anus:  normal sphincter tone, no lesions  Chaperone was present for exam.  Assessment:   Well woman visit with normal exam. Overactive bladder.  Off meds. Incomplete uterovaginal prolapse. Osteoporosis.  Doing Reclast injections.  Compression fracture of thoracic/lumbar region. Vulvovaginal atrophy.  FH of breast cancer in mother.  Plan: Mammogram  screening. Recommended self breast awareness. Pap and HR HPV as above. Guidelines for Calcium, Vitamin  D, regular exercise program including cardiovascular and weight bearing exercise. Refill of vaginal estrogen cream.  I discussed potential effect on breast cancer.  BMD this year with endocrinology.  Support given for caring for her husband with cognitive decline. Follow up annually and prn.  After visit summary provided.

## 2018-05-29 ENCOUNTER — Ambulatory Visit (INDEPENDENT_AMBULATORY_CARE_PROVIDER_SITE_OTHER): Payer: Medicare Other | Admitting: Obstetrics and Gynecology

## 2018-05-29 ENCOUNTER — Encounter: Payer: Self-pay | Admitting: Obstetrics and Gynecology

## 2018-05-29 ENCOUNTER — Other Ambulatory Visit (HOSPITAL_COMMUNITY)
Admission: RE | Admit: 2018-05-29 | Discharge: 2018-05-29 | Disposition: A | Discharge status: Home / Self Care | Payer: Medicare Other | Source: Ambulatory Visit | Attending: Obstetrics and Gynecology | Admitting: Obstetrics and Gynecology

## 2018-05-29 VITALS — BP 110/64 | HR 62 | Resp 14 | Ht 64.5 in | Wt 128.0 lb

## 2018-05-29 DIAGNOSIS — Z01419 Encounter for gynecological examination (general) (routine) without abnormal findings: Secondary | ICD-10-CM | POA: Insufficient documentation

## 2018-05-29 MED ORDER — ESTROGENS, CONJUGATED 0.625 MG/GM VA CREA: TOPICAL_CREAM | VAGINAL | 1 refills | Status: DC

## 2018-05-29 NOTE — Patient Instructions (Signed)
Drink at least 4 glasses of water today. Continue to take your calcium and Vitamin D per Dr. Cordelia Pen order

## 2018-05-29 NOTE — Patient Instructions (Signed)

## 2018-06-02 LAB — CYTOLOGY - PAP: Diagnosis: NEGATIVE

## 2018-06-02 NOTE — Telephone Encounter (Signed)
Message given to Mrs. Mary Malone from Dr. Loanne Drilling.  She reported good understanding of this.

## 2018-06-03 ENCOUNTER — Telehealth: Payer: Self-pay | Admitting: Obstetrics and Gynecology

## 2018-06-03 ENCOUNTER — Encounter: Payer: Self-pay | Admitting: Endocrinology

## 2018-06-03 ENCOUNTER — Encounter: Payer: Self-pay | Admitting: Obstetrics and Gynecology

## 2018-06-03 NOTE — Telephone Encounter (Signed)
Left detailed message, ok per dpr. Advised as seen below per Dr. Quincy Simmonds. Return call to office if any additional questions.   Encounter closed.

## 2018-06-03 NOTE — Telephone Encounter (Signed)
Dr. Loanne Drilling will need to place the order for the bone density as he is managing the patient's osteoporosis.

## 2018-06-03 NOTE — Telephone Encounter (Signed)
Dr. Quincy Simmonds -last BMD 07/11/16 at [facility]. Patient referred to Dr. Loanne Drilling for severe osteoporosis. OK to place order for BMD?

## 2018-06-03 NOTE — Telephone Encounter (Signed)
Patient sent the following message through [place]. Routing to triage to assist patient with request.  Dr. Quincy Simmonds,  Dr. Cordelia Pen nurse called yesterday to let me know that Dr. Loanne Drilling wants to review my bone density scan in November before ordering any additional treatment for my osteoporosis. Will you be ordering that scan for me?  Thanks  Mary Malone

## 2018-06-04 ENCOUNTER — Other Ambulatory Visit: Payer: Self-pay | Admitting: Endocrinology

## 2018-06-04 DIAGNOSIS — M810 Age-related osteoporosis without current pathological fracture: Secondary | ICD-10-CM

## 2018-06-13 ENCOUNTER — Ambulatory Visit: Payer: Medicare Other

## 2018-06-13 ENCOUNTER — Ambulatory Visit: Payer: Medicare Other | Admitting: Internal Medicine

## 2018-06-17 NOTE — Progress Notes (Signed)
Subjective:   Mary Malone is a 72 y.o. female who presents for Medicare Annual (Subsequent) preventive examination.  Review of Systems:  No ROS.  Medicare Wellness Visit. Additional risk factors are reflected in the social history.  Cardiac Risk Factors include: advanced age (>73men, >15 women);dyslipidemia Sleep patterns: gets up 1 times nightly to void and sleeps 6 hours nightly.   Home Safety/Smoke Alarms: Feels safe in home. Smoke alarms in place.  Living environment; residence and Firearm Safety: 1-story house/ trailer, no firearms. Lives with husband, no needs for DME, limited support system Seat Belt Safety/Bike Helmet: Wears seat belt.      Objective:     Vitals: BP 138/68   Pulse 74   Temp 98 F (36.7 C)   Resp 17   Ht 5\' 5"  (1.651 m)   Wt 130 lb (59 kg)   LMP 09/03/1996   SpO2 99%   BMI 21.63 kg/m   Body mass index is 21.63 kg/m.  Advanced Directives 06/18/2018 02/17/2015  Does Patient Have a Medical Advance Directive? Yes Yes  Type of Paramedic of [place];Living will [place];Living will  Copy of [place] in Chart? No - copy requested -    Tobacco Social History   Tobacco Use  Smoking Status Never Smoker  Smokeless Tobacco Never Used     Counseling given: Not Answered  Past Medical History:  Diagnosis Date  . ALLERGIC RHINITIS   . Back pain   . Dense breast   . DEPRESSION   . Fibroid   . GERD   . Hearing loss   . HYPERLIPIDEMIA    diet control efforts  . Osteoarthritis   . Osteoporosis   . PMB (postmenopausal bleeding) 12/2001   polyps  . URINARY INCONTINENCE    OAB  . Wisdom teeth removed 1866/1967   Past Surgical History:  Procedure Laterality Date  . BREAST CYST ASPIRATION  12/05/2001  . CATARACT EXTRACTION Bilateral 07/2012, 09/2012  . DILATION AND CURETTAGE OF UTERUS  2003  . HYSTEROSCOPY  01/2002   resection, sm fibroid  . Left knee surgey    . MENISCUS REPAIR      left knee 2010  . REFRACTIVE SURGERY  02/2016  . WISDOM TOOTH EXTRACTION     Family History  Problem Relation Age of Onset  . Alzheimer's disease Mother   . Allergies Mother   . Breast cancer Mother 33  . Dementia Mother   . Lung cancer Father 43  . Allergies Brother   . Diabetes Brother   . Transient ischemic attack Maternal Grandmother   . Peripheral Artery Disease Maternal Grandmother        s/p B leg amputation  . Stroke Maternal Grandmother   . Tuberculosis Maternal Grandfather   . Diabetes Brother 99  . Alzheimer's disease Brother 15       early onset  . Sudden death Paternal Grandmother 63       unknown  . Breast cancer Paternal Aunt   . Osteoporosis Neg Hx    Social History   Socioeconomic History  . Marital status: Married    Spouse name: Not on file  . Number of children: Not on file  . Years of education: Not on file  . Highest education level: Not on file  Occupational History  . Not on file  Social Needs  . Financial resource strain: Not hard at all  . Food insecurity:    Worry: Never  true    Inability: Never true  . Transportation needs:    Medical: No    Non-medical: No  Tobacco Use  . Smoking status: Never Smoker  . Smokeless tobacco: Never Used  Substance and Sexual Activity  . Alcohol use: No    Alcohol/week: 1.0 standard drinks    Types: 1 Standard drinks or equivalent per week  . Drug use: No  . Sexual activity: Never    Partners: Male    Birth control/protection: Post-menopausal  Lifestyle  . Physical activity:    Days per week: 0 days    Minutes per session: 0 min  . Stress: Rather much  Relationships  . Social connections:    Talks on phone: Twice a week    Gets together: Twice a week    Attends religious service: 1 to 4 times per year    Active member of club or organization: Yes    Attends meetings of clubs or organizations: 1 to 4 times per year    Relationship status: Married  Other Topics Concern  . Not on file    Social History Narrative  . Not on file    Outpatient Encounter Medications as of 06/18/2018  Medication Sig  . aspirin 81 MG tablet Take 81 mg by mouth daily.  . Azelastine-Fluticasone (DYMISTA) 137-50 MCG/ACT SUSP Place 1-2 sprays into the nose at bedtime.  . Calcium Citrate-Vitamin D (CITRACAL + D PO) Take 1,200 mg by mouth daily.  . Cholecalciferol (VITAMIN D3) 2000 units TABS Take 1 tablet by mouth daily.  Marland Kitchen conjugated estrogens (PREMARIN) vaginal cream USE 1/2 GRAM VAGINALLY TWICE WEEKLY  . cromolyn (NASALCROM) 5.2 MG/ACT nasal spray Place 1 spray into both nostrils 2 (two) times daily.  . famotidine (PEPCID) 10 MG tablet Take 10 mg by mouth once.   . montelukast (SINGULAIR) 10 MG tablet Take 1 tablet (10 mg total) by mouth at bedtime.  . Multiple Vitamins-Minerals (CENTRUM SILVER) tablet Take 1 tablet by mouth daily.  . Omega-3 Fatty Acids (FISH OIL) 1200 MG CAPS Take by mouth daily.  . pseudoephedrine (SUDAFED) 120 MG 12 hr tablet Take 120 mg by mouth once.   . vitamin E 400 UNIT capsule Take 800 Units by mouth daily.   . Zoledronic Acid (RECLAST IV) Inject into the vein. IV infusion yearly  . [DISCONTINUED] triamterene-hydrochlorothiazide (DYAZIDE) 37.5-25 MG capsule Take 1 capsule by mouth daily. 1   No facility-administered encounter medications on file as of 06/18/2018.     Activities of Daily Living In your present state of health, do you have any difficulty performing the following activities: 06/18/2018  Hearing? N  Vision? N  Difficulty concentrating or making decisions? N  Walking or climbing stairs? N  Dressing or bathing? N  Doing errands, shopping? N  Preparing Food and eating ? N  Using the Toilet? N  In the past six months, have you accidently leaked urine? N  Do you have problems with loss of bowel control? N  Managing your Medications? N  Managing your Finances? N  Housekeeping or managing your Housekeeping? N  Some recent data might be hidden     Patient Care Team: Hoyt Koch, MD as PCP - General (Internal Medicine) Marlou Sa, Tonna Corner, MD (Orthopedic Surgery) Deneise Lever, MD (Pulmonary Disease) Juanita Craver, MD (Gastroenterology) Rutherford Guys, MD (Ophthalmology) Yisroel Ramming, Everardo All, MD (Obstetrics and Gynecology) Garvin Fila, MD as Consulting Physician (Neurology) Renato Shin, MD as Consulting Physician (Endocrinology)  Assessment:   This is a routine wellness examination for [place]. Physical assessment deferred to PCP.   Exercise Activities and Dietary recommendations Current Exercise Habits: The patient does not participate in regular exercise at present(Patient is not sedentary, stays very active doing many house-hold needs daily.), Exercise limited by: None identified  Diet (meal preparation, eat out, water intake, caffeinated beverages, dairy products, fruits and vegetables): in general, an "unhealthy" diet.  Patient explains that she is too busy trying to prepare her house to sale and also taking care of her husband to prepare meals.   Reviewed heart healthy diet. Encouraged patient to maintain daily water and healthy fluid intake. Discussed buying nutritious prepared meals at the grocery store to avoid fast food and also drinking nutritional supplements.     Goals    . Patient Stated     Continue to take time for myself and relax by doing things that bring me joy.        Fall Risk Fall Risk  06/18/2018 06/11/2017 02/13/2016 10/04/2015 07/11/2015  Falls in the past year? No No No No No    Depression Screen PHQ 2/9 Scores 06/18/2018 06/11/2017 02/13/2016 02/03/2014  PHQ - 2 Score 2 0 0 0  PHQ- 9 Score 4 - - -     Cognitive Function MMSE - Mini Mental State Exam 06/18/2018  Not completed: Refused       Ad8 score reviewed for issues:  Issues making decisions: no  Less interest in hobbies / activities: no  Repeats questions, stories (family complaining): no  Trouble using  ordinary gadgets (microwave, computer, phone):no  Forgets the month or year: no  Mismanaging finances: no  Remembering appts: no  Daily problems with thinking and/or memory: no Ad8 score is= 0  Immunization History  Administered Date(s) Administered  . Influenza Split 05/14/2013  . Influenza Whole 05/09/2011  . Influenza, High Dose Seasonal PF 06/17/2017  . Influenza,inj,Quad PF,6+ Mos 05/04/2014  . Influenza-Unspecified 05/15/2012, 05/19/2012, 06/04/2015, 06/14/2016, 06/12/2018  . Pneumococcal Conjugate-13 02/10/2015  . Pneumococcal Polysaccharide-23 09/22/2008, 02/19/2017  . Td 01/28/2013   Screening Tests Health Maintenance  Topic Date Due  . MAMMOGRAM  02/11/2020  . TETANUS/TDAP  01/29/2023  . COLONOSCOPY  06/16/2024  . INFLUENZA VACCINE  Completed  . DEXA SCAN  Completed  . Hepatitis C Screening  Completed  . PNA vac Low Risk Adult  Completed      Plan:   Continue doing brain stimulating activities (puzzles, reading, adult coloring books, staying active) to keep memory sharp.   Continue to eat heart healthy diet (full of fruits, vegetables, whole grains, lean protein, water--limit salt, fat, and sugar intake) and increase physical activity as tolerated.  I have personally reviewed and noted the following in the patient's chart:   . Medical and social history . Use of alcohol, tobacco or illicit drugs  . Current medications and supplements . Functional ability and status . Nutritional status . Physical activity . Advanced directives . List of other physicians . Vitals . Screenings to include cognitive, depression, and falls . Referrals and appointments  In addition, I have reviewed and discussed with patient certain preventive protocols, quality metrics, and best practice recommendations. A written personalized care plan for preventive services as well as general preventive health recommendations were provided to patient.     Michiel Cowboy,  RN  06/18/2018

## 2018-06-18 ENCOUNTER — Ambulatory Visit: Payer: Medicare Other | Admitting: *Deleted

## 2018-06-18 ENCOUNTER — Encounter: Payer: Self-pay | Admitting: Internal Medicine

## 2018-06-18 ENCOUNTER — Ambulatory Visit (INDEPENDENT_AMBULATORY_CARE_PROVIDER_SITE_OTHER): Payer: Medicare Other | Admitting: Internal Medicine

## 2018-06-18 ENCOUNTER — Other Ambulatory Visit (INDEPENDENT_AMBULATORY_CARE_PROVIDER_SITE_OTHER): Payer: Medicare Other

## 2018-06-18 VITALS — BP 138/68 | HR 74 | Temp 98.0°F | Resp 17 | Ht 65.0 in | Wt 130.0 lb

## 2018-06-18 VITALS — BP 138/68 | HR 78 | Temp 98.0°F | Ht 65.0 in | Wt 130.0 lb

## 2018-06-18 DIAGNOSIS — N3281 Overactive bladder: Secondary | ICD-10-CM

## 2018-06-18 DIAGNOSIS — Z0000 Encounter for general adult medical examination without abnormal findings: Secondary | ICD-10-CM

## 2018-06-18 DIAGNOSIS — M810 Age-related osteoporosis without current pathological fracture: Secondary | ICD-10-CM | POA: Diagnosis not present

## 2018-06-18 DIAGNOSIS — J3089 Other allergic rhinitis: Secondary | ICD-10-CM

## 2018-06-18 DIAGNOSIS — J302 Other seasonal allergic rhinitis: Secondary | ICD-10-CM

## 2018-06-18 LAB — COMPREHENSIVE METABOLIC PANEL
ALT: 17 U/L (ref 0–35)
AST: 17 U/L (ref 0–37)
Albumin: 4.4 g/dL (ref 3.5–5.2)
Alkaline Phosphatase: 47 U/L (ref 39–117)
BUN: 22 mg/dL (ref 6–23)
CO2: 30 mEq/L (ref 19–32)
Calcium: 10 mg/dL (ref 8.4–10.5)
Chloride: 105 mEq/L (ref 96–112)
Creatinine, Ser: 0.94 mg/dL (ref 0.40–1.20)
GFR: 62.34 mL/min (ref >60.00)
Glucose, Bld: 97 mg/dL (ref 70–99)
Potassium: 4.7 mEq/L (ref 3.5–5.1)
Sodium: 141 mEq/L (ref 135–145)
Total Bilirubin: 0.6 mg/dL (ref 0.2–1.2)
Total Protein: 7.3 g/dL (ref 6.0–8.3)

## 2018-06-18 LAB — LIPID PANEL
Cholesterol: 227 mg/dL — ABNORMAL HIGH (ref 0–200)
HDL: 80.5 mg/dL (ref >39.00)
LDL Cholesterol: 134 mg/dL — ABNORMAL HIGH (ref 0–99)
NonHDL: 146.4
Total CHOL/HDL Ratio: 3
Triglycerides: 63 mg/dL (ref 0.0–149.0)
VLDL: 12.6 mg/dL (ref 0.0–40.0)

## 2018-06-18 LAB — CBC
HCT: 37.6 % (ref 36.0–46.0)
Hemoglobin: 12.9 g/dL (ref 12.0–15.0)
MCHC: 34.3 g/dL (ref 30.0–36.0)
MCV: 92.2 fl (ref 78.0–100.0)
Platelets: 273 10*3/uL (ref 150.0–400.0)
RBC: 4.07 Mil/uL (ref 3.87–5.11)
RDW: 12.6 % (ref 11.5–15.5)
WBC: 5.4 10*3/uL (ref 4.0–10.5)

## 2018-06-18 LAB — TSH: TSH: 4.67 u[IU]/mL — ABNORMAL HIGH (ref 0.35–4.50)

## 2018-06-18 LAB — T4, FREE: Free T4: 0.81 ng/dL (ref 0.60–1.60)

## 2018-06-18 NOTE — Progress Notes (Signed)
   Subjective:    Patient ID: Mary Malone, female    DOB: [birth year], 72 y.o.   MRN: [id number]  HPI The patient is a 72 YO female coming in for physical. No new concerns.  PMH, [facility], social history reviewed and updated  Review of Systems  Constitutional: Negative.   HENT: Negative.   Eyes: Negative.   Respiratory: Negative for cough, chest tightness and shortness of breath.   Cardiovascular: Negative for chest pain, palpitations and leg swelling.  Gastrointestinal: Negative for abdominal distention, abdominal pain, constipation, diarrhea, nausea and vomiting.  Musculoskeletal: Negative.   Skin: Negative.   Neurological: Negative.   Psychiatric/Behavioral: Negative.       Objective:   Physical Exam  Constitutional: She is oriented to person, place, and time. She appears well-developed and well-nourished.  HENT:  Head: Normocephalic and atraumatic.  Eyes: EOM are normal.  Neck: Normal range of motion.  Cardiovascular: Normal rate and regular rhythm.  Pulmonary/Chest: Effort normal and breath sounds normal. No respiratory distress. She has no wheezes. She has no rales.  Abdominal: Soft. Bowel sounds are normal. She exhibits no distension. There is no tenderness. There is no rebound.  Musculoskeletal: She exhibits no edema.  Neurological: She is alert and oriented to person, place, and time. Coordination normal.  Skin: Skin is warm and dry.  Psychiatric: She has a normal mood and affect.   Vitals:   06/18/18 0953  BP: 138/68  Pulse: 78  Temp: 98 F (36.7 C)  TempSrc: Oral  SpO2: 99%  Weight: 130 lb (59 kg)  Height: 5\' 5"  (1.651 m)      Assessment & Plan:

## 2018-06-18 NOTE — Patient Instructions (Addendum)
Continue doing brain stimulating activities (puzzles, reading, adult coloring books, staying active) to keep memory sharp.   Continue to eat heart healthy diet (full of fruits, vegetables, whole grains, lean protein, water--limit salt, fat, and sugar intake) and increase physical activity as tolerated.   [name] , Thank you for taking time to come for your Medicare Wellness Visit. I appreciate your ongoing commitment to your health goals. Please review the following plan we discussed and let me know if I can assist you in the future.   These are the goals we discussed: Goals    . Patient Stated     Continue to take time for myself and relax by doing things that bring me joy.        This is a list of the screening recommended for you and due dates:  Health Maintenance  Topic Date Due  . Mammogram  02/11/2020  . Tetanus Vaccine  01/29/2023  . Colon Cancer Screening  06/16/2024  . Flu Shot  Completed  . DEXA scan (bone density measurement)  Completed  .  Hepatitis C: One time screening is recommended by Center for Disease Control  (CDC) for  adults born from [birth year] through 1964.   Completed  . Pneumonia vaccines  Completed   Health Maintenance, Female Adopting a healthy lifestyle and getting preventive care can go a long way to promote health and wellness. Talk with your health care provider about what schedule of regular examinations is right for you. This is a good chance for you to check in with your provider about disease prevention and staying healthy. In between checkups, there are plenty of things you can do on your own. Experts have done a lot of research about which lifestyle changes and preventive measures are most likely to keep you healthy. Ask your health care provider for more information. Weight and diet Eat a healthy diet  Be sure to include plenty of vegetables, fruits, low-fat dairy products, and lean protein.  Do not eat a lot of foods high in solid fats, added  sugars, or salt.  Get regular exercise. This is one of the most important things you can do for your health. ? Most adults should exercise for at least 150 minutes each week. The exercise should increase your heart rate and make you sweat (moderate-intensity exercise). ? Most adults should also do strengthening exercises at least twice a week. This is in addition to the moderate-intensity exercise.  Maintain a healthy weight  Body mass index (BMI) is a measurement that can be used to identify possible weight problems. It estimates body fat based on height and weight. Your health care provider can help determine your BMI and help you achieve or maintain a healthy weight.  For females 42 years of age and older: ? A BMI below 18.5 is considered underweight. ? A BMI of 18.5 to 24.9 is normal. ? A BMI of 25 to 29.9 is considered overweight. ? A BMI of 30 and above is considered obese.  Watch levels of cholesterol and blood lipids  You should start having your blood tested for lipids and cholesterol at 71 years of age, then have this test every 5 years.  You may need to have your cholesterol levels checked more often if: ? Your lipid or cholesterol levels are high. ? You are older than 71 years of age. ? You are at high risk for heart disease.  Cancer screening Lung Cancer  Lung cancer screening is recommended for adults  55-80 years old who are at high risk for lung cancer because of a history of smoking.  A yearly low-dose CT scan of the lungs is recommended for people who: ? Currently smoke. ? Have quit within the past 15 years. ? Have at least a 30-pack-year history of smoking. A pack year is smoking an average of one pack of cigarettes a day for 1 year.  Yearly screening should continue until it has been 15 years since you quit.  Yearly screening should stop if you develop a health problem that would prevent you from having lung cancer treatment.  Breast Cancer  Practice breast  self-awareness. This means understanding how your breasts normally appear and feel.  It also means doing regular breast self-exams. Let your health care provider know about any changes, no matter how small.  If you are in your 20s or 30s, you should have a clinical breast exam (CBE) by a health care provider every 1-3 years as part of a regular health exam.  If you are 40 or older, have a CBE every year. Also consider having a breast X-ray (mammogram) every year.  If you have a family history of breast cancer, talk to your health care provider about genetic screening.  If you are at high risk for breast cancer, talk to your health care provider about having an MRI and a mammogram every year.  Breast cancer gene (BRCA) assessment is recommended for women who have family members with BRCA-related cancers. BRCA-related cancers include: ? Breast. ? Ovarian. ? Tubal. ? Peritoneal cancers.  Results of the assessment will determine the need for genetic counseling and BRCA1 and BRCA2 testing.  Cervical Cancer Your health care provider may recommend that you be screened regularly for cancer of the pelvic organs (ovaries, uterus, and vagina). This screening involves a pelvic examination, including checking for microscopic changes to the surface of your cervix (Pap test). You may be encouraged to have this screening done every 3 years, beginning at age 21.  For women ages 30-65, health care providers may recommend pelvic exams and Pap testing every 3 years, or they may recommend the Pap and pelvic exam, combined with testing for human papilloma virus (HPV), every 5 years. Some types of HPV increase your risk of cervical cancer. Testing for HPV may also be done on women of any age with unclear Pap test results.  Other health care providers may not recommend any screening for nonpregnant women who are considered low risk for pelvic cancer and who do not have symptoms. Ask your health care provider if a  screening pelvic exam is right for you.  If you have had past treatment for cervical cancer or a condition that could lead to cancer, you need Pap tests and screening for cancer for at least 20 years after your treatment. If Pap tests have been discontinued, your risk factors (such as having a new sexual partner) need to be reassessed to determine if screening should resume. Some women have medical problems that increase the chance of getting cervical cancer. In these cases, your health care provider may recommend more frequent screening and Pap tests.  Colorectal Cancer  This type of cancer can be detected and often prevented.  Routine colorectal cancer screening usually begins at 71 years of age and continues through 71 years of age.  Your health care provider may recommend screening at an earlier age if you have risk factors for colon cancer.  Your health care provider may also recommend using   home test kits to check for hidden blood in the stool.  A small camera at the end of a tube can be used to examine your colon directly (sigmoidoscopy or colonoscopy). This is done to check for the earliest forms of colorectal cancer.  Routine screening usually begins at age 50.  Direct examination of the colon should be repeated every 5-10 years through 71 years of age. However, you may need to be screened more often if early forms of precancerous polyps or small growths are found.  Skin Cancer  Check your skin from head to toe regularly.  Tell your health care provider about any new moles or changes in moles, especially if there is a change in a mole's shape or color.  Also tell your health care provider if you have a mole that is larger than the size of a pencil eraser.  Always use sunscreen. Apply sunscreen liberally and repeatedly throughout the day.  Protect yourself by wearing long sleeves, pants, a wide-brimmed hat, and sunglasses whenever you are outside.  Heart disease, diabetes, and  high blood pressure  High blood pressure causes heart disease and increases the risk of stroke. High blood pressure is more likely to develop in: ? People who have blood pressure in the high end of the normal range (130-139/85-89 mm Hg). ? People who are overweight or obese. ? People who are African American.  If you are 18-39 years of age, have your blood pressure checked every 3-5 years. If you are 40 years of age or older, have your blood pressure checked every year. You should have your blood pressure measured twice-once when you are at a hospital or clinic, and once when you are not at a hospital or clinic. Record the average of the two measurements. To check your blood pressure when you are not at a hospital or clinic, you can use: ? An automated blood pressure machine at a pharmacy. ? A home blood pressure monitor.  If you are between 55 years and 79 years old, ask your health care provider if you should take aspirin to prevent strokes.  Have regular diabetes screenings. This involves taking a blood sample to check your fasting blood sugar level. ? If you are at a normal weight and have a low risk for diabetes, have this test once every three years after 71 years of age. ? If you are overweight and have a high risk for diabetes, consider being tested at a younger age or more often. Preventing infection Hepatitis B  If you have a higher risk for hepatitis B, you should be screened for this virus. You are considered at high risk for hepatitis B if: ? You were born in a country where hepatitis B is common. Ask your health care provider which countries are considered high risk. ? Your parents were born in a high-risk country, and you have not been immunized against hepatitis B (hepatitis B vaccine). ? You have HIV or AIDS. ? You use needles to inject street drugs. ? You live with someone who has hepatitis B. ? You have had sex with someone who has hepatitis B. ? You get hemodialysis  treatment. ? You take certain medicines for conditions, including cancer, organ transplantation, and autoimmune conditions.  Hepatitis C  Blood testing is recommended for: ? Everyone born from 1945 through 1965. ? Anyone with known risk factors for hepatitis C.  Sexually transmitted infections (STIs)  You should be screened for sexually transmitted infections (STIs) including gonorrhea and   chlamydia if: ? You are sexually active and are younger than 71 years of age. ? You are older than 71 years of age and your health care provider tells you that you are at risk for this type of infection. ? Your sexual activity has changed since you were last screened and you are at an increased risk for chlamydia or gonorrhea. Ask your health care provider if you are at risk.  If you do not have HIV, but are at risk, it may be recommended that you take a prescription medicine daily to prevent HIV infection. This is called pre-exposure prophylaxis (PrEP). You are considered at risk if: ? You are sexually active and do not regularly use condoms or know the HIV status of your partner(s). ? You take drugs by injection. ? You are sexually active with a partner who has HIV.  Talk with your health care provider about whether you are at high risk of being infected with HIV. If you choose to begin PrEP, you should first be tested for HIV. You should then be tested every 3 months for as long as you are taking PrEP. Pregnancy  If you are premenopausal and you may become pregnant, ask your health care provider about preconception counseling.  If you may become pregnant, take 400 to 800 micrograms (mcg) of folic acid every day.  If you want to prevent pregnancy, talk to your health care provider about birth control (contraception). Osteoporosis and menopause  Osteoporosis is a disease in which the bones lose minerals and strength with aging. This can result in serious bone fractures. Your risk for osteoporosis  can be identified using a bone density scan.  If you are 43 years of age or older, or if you are at risk for osteoporosis and fractures, ask your health care provider if you should be screened.  Ask your health care provider whether you should take a calcium or vitamin D supplement to lower your risk for osteoporosis.  Menopause may have certain physical symptoms and risks.  Hormone replacement therapy may reduce some of these symptoms and risks. Talk to your health care provider about whether hormone replacement therapy is right for you. Follow these instructions at home:  Schedule regular health, dental, and eye exams.  Stay current with your immunizations.  Do not use any tobacco products including cigarettes, chewing tobacco, or electronic cigarettes.  If you are pregnant, do not drink alcohol.  If you are breastfeeding, limit how much and how often you drink alcohol.  Limit alcohol intake to no more than 1 drink per day for nonpregnant women. One drink equals 12 ounces of beer, 5 ounces of Maridee Slape, or 1 ounces of hard liquor.  Do not use street drugs.  Do not share needles.  Ask your health care provider for help if you need support or information about quitting drugs.  Tell your health care provider if you often feel depressed.  Tell your health care provider if you have ever been abused or do not feel safe at home. This information is not intended to replace advice given to you by your health care provider. Make sure you discuss any questions you have with your health care provider. Document Released: 03/05/2011 Document Revised: 01/26/2016 Document Reviewed: 05/24/2015 Elsevier Interactive Patient Education  [employer].

## 2018-06-18 NOTE — Progress Notes (Signed)
Medical screening examination/treatment/procedure(s) were performed by non-physician practitioner and as supervising physician I was immediately available for consultation/collaboration. I agree with above. Meliyah Simon A Jayveon Convey, MD 

## 2018-06-19 NOTE — Assessment & Plan Note (Signed)
Taking sinulair and dymista and cromolyn with good control of symptoms.

## 2018-06-19 NOTE — Assessment & Plan Note (Signed)
Getting reclast through endo. Bone density due in November and already scheduled. If no improvement may need additional therapy.

## 2018-06-19 NOTE — Assessment & Plan Note (Signed)
Premarin cream helps some.

## 2018-06-19 NOTE — Assessment & Plan Note (Signed)
Flu shot up to date. Pneumonia complete. Shingrix counseled. Tetanus up to date. Colonoscopy up to date. Mammogram up to date, pap smear aged out and dexa up to date. Counseled about sun safety and mole surveillance. Counseled about the dangers of distracted driving. Given 10 year screening recommendations.  

## 2018-06-30 ENCOUNTER — Ambulatory Visit: Payer: Self-pay

## 2018-06-30 ENCOUNTER — Ambulatory Visit (INDEPENDENT_AMBULATORY_CARE_PROVIDER_SITE_OTHER): Payer: Medicare Other

## 2018-06-30 ENCOUNTER — Encounter: Payer: Self-pay | Admitting: Family Medicine

## 2018-06-30 ENCOUNTER — Ambulatory Visit (INDEPENDENT_AMBULATORY_CARE_PROVIDER_SITE_OTHER): Payer: Medicare Other | Admitting: Family Medicine

## 2018-06-30 VITALS — BP 122/70 | HR 81 | Temp 98.1°F | Resp 16 | Ht 65.0 in | Wt 130.1 lb

## 2018-06-30 DIAGNOSIS — M79605 Pain in left leg: Secondary | ICD-10-CM

## 2018-06-30 DIAGNOSIS — M25572 Pain in left ankle and joints of left foot: Secondary | ICD-10-CM | POA: Diagnosis not present

## 2018-06-30 DIAGNOSIS — L03116 Cellulitis of left lower limb: Secondary | ICD-10-CM

## 2018-06-30 MED ORDER — CEPHALEXIN 500 MG PO CAPS: 500.0000 mg | ORAL_CAPSULE | Freq: Two times a day (BID) | ORAL | 0 refills | Status: AC

## 2018-06-30 NOTE — Telephone Encounter (Signed)
Pt. Reports Saturday started having left ankle pain and swelling. Hurts with weight bearing. Pain is in ankle and front "of my shin bone." No redness, but "My shin looks discolored like an old bruise." No recent injury. No calf pain of fever. Has not taken anything for pain. Uses Aleve for pain - will try and see if this relieves any pain. Appointment made for today. No availability at [facility] office.  Reason for Disposition . [1] Very swollen joint AND [2] no fever  Answer Assessment - Initial Assessment Questions 1. ONSET: "When did the pain start?"      Saturday 2. LOCATION: "Where is the pain located?"      Left ankle and up her shin bone 3. PAIN: "How bad is the pain?"    (Scale 1-10; or mild, moderate, severe)  - MILD (1-3): doesn't interfere with normal activities   - MODERATE (4-7): interferes with normal activities (e.g., work or school) or awakens from sleep, limping   - SEVERE (8-10): excruciating pain, unable to do any normal activities, unable to walk      Walking on it    5-6 4. WORK OR EXERCISE: "Has there been any recent work or exercise that involved this part of the body?"      No 5. CAUSE: "What do you think is causing the ankle pain?"     Unsure 6. OTHER SYMPTOMS: "Do you have any other symptoms?" (e.g., calf pain, rash, fever, swelling)     Discoloration above the ankle on her shin 7. PREGNANCY: "Is there any chance you are pregnant?" "When was your last menstrual period?"     No  Protocols used: ANKLE PAIN-A-AH

## 2018-06-30 NOTE — Telephone Encounter (Signed)
Noted  

## 2018-06-30 NOTE — Progress Notes (Signed)
ACUTE VISIT   HPI:  Chief Complaint  Patient presents with  . Left ankle pain    swelling and pain in left ankle that 3 days ago, radiates to shin    Mary Malone is a 72 y.o. female, who is here today with her husband complaining of left ankle and distal LE pain that started 3 days ago. No Hx of trauma but a day prior to symptoms she walk for long on "concrete floor up and down."   Yesterday it "was very painful", 5-6/10. It is better today. Sharp pain on anterior aspect of ankle and radiated to pretibial area.  Pain is constant, exacerbated by walking and alleviated by rest. No edema but has noted mild erythema.  Noted small superficial excoriation, she is not sure what caused it but she has scratched lesion trying to remove scab.  No on hormonal therapy,no recent long travel,or surgery. She denies chest pain,dyspnea,palpitation,or diaphoresis.   She has taken Advil ,last dose today at 1 pm.     Review of Systems  Constitutional: Positive for activity change. Negative for appetite change, chills and fever.  Respiratory: Negative for shortness of breath and wheezing.   Cardiovascular: Negative for chest pain and palpitations.  Gastrointestinal: Negative for nausea and vomiting.  Musculoskeletal: Positive for arthralgias and myalgias. Negative for joint swelling.  Skin: Positive for rash. Negative for wound.  Neurological: Negative for weakness and numbness.      Current Outpatient Medications on File Prior to Visit  Medication Sig Dispense Refill  . aspirin 81 MG tablet Take 81 mg by mouth daily.    . Azelastine-Fluticasone (DYMISTA) 137-50 MCG/ACT SUSP Place 1-2 sprays into the nose at bedtime. 23 g 11  . Calcium Citrate-Vitamin D (CITRACAL + D PO) Take 1,200 mg by mouth daily.    . Cholecalciferol (VITAMIN D3) 2000 units TABS Take 1 tablet by mouth daily.    Marland Kitchen conjugated estrogens (PREMARIN) vaginal cream USE 1/2 GRAM VAGINALLY TWICE WEEKLY 30 g 1    . cromolyn (NASALCROM) 5.2 MG/ACT nasal spray Place 1 spray into both nostrils 2 (two) times daily.    . famotidine (PEPCID) 10 MG tablet Take 10 mg by mouth once.     . montelukast (SINGULAIR) 10 MG tablet Take 1 tablet (10 mg total) by mouth at bedtime. 30 tablet 12  . Multiple Vitamins-Minerals (CENTRUM SILVER) tablet Take 1 tablet by mouth daily.    . Omega-3 Fatty Acids (FISH OIL) 1200 MG CAPS Take by mouth daily.    . pseudoephedrine (SUDAFED) 120 MG 12 hr tablet Take 120 mg by mouth once.     . vitamin E 400 UNIT capsule Take 800 Units by mouth daily.     . Zoledronic Acid (RECLAST IV) Inject into the vein. IV infusion yearly     No current facility-administered medications on file prior to visit.      Past Medical History:  Diagnosis Date  . ALLERGIC RHINITIS   . Back pain   . Dense breast   . DEPRESSION   . Fibroid   . GERD   . Hearing loss   . HYPERLIPIDEMIA    diet control efforts  . Osteoarthritis   . Osteoporosis   . PMB (postmenopausal bleeding) 12/2001   polyps  . URINARY INCONTINENCE    OAB  . Wisdom teeth removed 1866/1967   No Known Allergies  Social History   Socioeconomic History  . Marital status: Married    Spouse  name: Not on file  . Number of children: Not on file  . Years of education: Not on file  . Highest education level: Not on file  Occupational History  . Not on file  Social Needs  . Financial resource strain: Not hard at all  . Food insecurity:    Worry: Never true    Inability: Never true  . Transportation needs:    Medical: No    Non-medical: No  Tobacco Use  . Smoking status: Never Smoker  . Smokeless tobacco: Never Used  Substance and Sexual Activity  . Alcohol use: No    Alcohol/week: 1.0 standard drinks    Types: 1 Standard drinks or equivalent per week  . Drug use: No  . Sexual activity: Never    Partners: Male    Birth control/protection: Post-menopausal  Lifestyle  . Physical activity:    Days per week: 0 days     Minutes per session: 0 min  . Stress: Rather much  Relationships  . Social connections:    Talks on phone: Twice a week    Gets together: Twice a week    Attends religious service: 1 to 4 times per year    Active member of club or organization: Yes    Attends meetings of clubs or organizations: 1 to 4 times per year    Relationship status: Married  Other Topics Concern  . Not on file  Social History Narrative  . Not on file    Vitals:   06/30/18 1639  BP: 122/70  Pulse: 81  Resp: 16  Temp: 98.1 F (36.7 C)  SpO2: 98%   Body mass index is 21.65 kg/m.   Physical Exam  Nursing note and vitals reviewed. Constitutional: She is oriented to person, place, and time. She appears well-developed and well-nourished. No distress.  HENT:  Head: Normocephalic and atraumatic.  Mouth/Throat: Oropharynx is clear and moist and mucous membranes are normal.  Eyes: Conjunctivae are normal.  Cardiovascular:  Pulses:      Dorsalis pedis pulses are 2+ on the left side.       Posterior tibial pulses are 2+ on the left side.  Homans sign negative.  Respiratory: Effort normal and breath sounds normal. No respiratory distress.  Musculoskeletal: She exhibits no edema.       Left ankle: Tenderness. Medial malleolus tenderness found. No lateral malleolus tenderness found. Achilles tendon exhibits no pain, no defect and normal Thompson's test results.  Lymphadenopathy:    She has no cervical adenopathy.  Neurological: She is alert and oriented to person, place, and time.  Skin: Skin is warm. There is erythema.     2-3 mm superficial excoriation pretibial left lower extremity.  Surrounded mild erythema, local heat and mild tenderness on the area.  Psychiatric: She has a normal mood and affect. Her speech is normal.  Well groomed, good eye contact.      ASSESSMENT AND PLAN:   Ms. Jerrell was seen today for left ankle pain.  Diagnoses and all orders for this visit:  Acute left ankle  pain  ? Ankle sprain.       Relative rest.Lower extremity elevation. Further recommendation will be given according to imaging results.  -     DG Ankle Complete Left; Future  Cellulitis of left lower extremity  On pretibial area. Instructed about warning signs. Cephalexin x 5 days. F/U in a week,before if needed.  -     DG Tibia/Fibula Left; Future -  cephALEXin (KEFLEX) 500 MG capsule; Take 1 capsule (500 mg total) by mouth 2 (two) times daily for 5 days.  Acute leg pain, left  I do not think history and clinical findings are suggestive of DVT, explained to patient and her husband that probability is never 0 but the likelihood is low. She was instructed to monitor for warning signs. Ambulation as tolerated.    Return in about 1 week (around 07/07/2018) for PCP.        Tameah Mihalko G. [place], MD  [facility]. [place] office.

## 2018-06-30 NOTE — Patient Instructions (Signed)
A few things to remember from today's visit:   Acute left ankle pain - Plan: DG Ankle Complete Left  Cellulitis of left lower extremity - Plan: DG Tibia/Fibula Left, cephALEXin (KEFLEX) 500 MG capsule  Acute leg pain, left  Ambulation. Aleve 220 mg bid for 5-7 days.  If calf pain or redness or worsening leg pain please seek immediate medical attention.  Please be sure medication list is accurate. If a new problem present, please set up appointment sooner than planned today.

## 2018-07-08 ENCOUNTER — Ambulatory Visit (INDEPENDENT_AMBULATORY_CARE_PROVIDER_SITE_OTHER): Payer: Medicare Other | Admitting: Internal Medicine

## 2018-07-08 ENCOUNTER — Encounter: Payer: Self-pay | Admitting: Internal Medicine

## 2018-07-08 DIAGNOSIS — M79605 Pain in left leg: Secondary | ICD-10-CM | POA: Diagnosis not present

## 2018-07-08 NOTE — Progress Notes (Signed)
   Subjective:    Patient ID: Mary Malone, female    DOB: [birth year], 71 y.o.   MRN: [id number]  HPI The patient is a 72 YO female coming in for follow up of cellulitis on the left lower leg. She did not have a trigger to episode. She was seen about 1 week ago and put on keflex and had 2 x-rays done. She does have osteoporosis and was worried about fracture. She has had good improvement in symptoms with the antibiotic. She denies fevers or chills. Denies falls. She is back to normal activity with minimal pain. Denies problem with antibiotics. Not taking anything for pain although she has used compression stockings some. Tried to elevate her feet but did not very often.   Review of Systems  Constitutional: Negative.   HENT: Negative.   Eyes: Negative.   Respiratory: Negative for cough, chest tightness and shortness of breath.   Cardiovascular: Negative for chest pain, palpitations and leg swelling.  Gastrointestinal: Negative for abdominal distention, abdominal pain, constipation, diarrhea, nausea and vomiting.  Musculoskeletal: Negative.   Skin: Negative.   Neurological: Negative.   Psychiatric/Behavioral: Negative.       Objective:   Physical Exam  Constitutional: She is oriented to person, place, and time. She appears well-developed and well-nourished.  HENT:  Head: Normocephalic and atraumatic.  Eyes: EOM are normal.  Neck: Normal range of motion.  Cardiovascular: Normal rate and regular rhythm.  Pulmonary/Chest: Effort normal and breath sounds normal. No respiratory distress. She has no wheezes. She has no rales.  Abdominal: Soft. Bowel sounds are normal. She exhibits no distension. There is no tenderness. There is no rebound.  Musculoskeletal: She exhibits no edema.  Minimal tenderness left shin without erythema.   Neurological: She is alert and oriented to person, place, and time. Coordination normal.  Skin: Skin is warm and dry.   Vitals:   07/08/18 1312  BP: 120/60    Pulse: 80  Temp: 97.8 F (36.6 C)  TempSrc: Oral  SpO2: 98%  Weight: 128 lb (58.1 kg)  Height: 5\' 5"  (1.651 m)      Assessment & Plan:

## 2018-07-08 NOTE — Patient Instructions (Signed)
You can wear compression socks when very active to help avoid swelling but otherwise this should go all the way back to normal.

## 2018-07-08 NOTE — Assessment & Plan Note (Signed)
Cellulitis is resolving and almost normal on exam. She will finish keflex and use compression stockings when needed. Tylenol for pain if needed.

## 2018-08-21 ENCOUNTER — Ambulatory Visit
Admission: RE | Admit: 2018-08-21 | Discharge: 2018-08-21 | Disposition: A | Discharge status: Home / Self Care | Payer: Medicare Other | Source: Ambulatory Visit | Attending: Endocrinology | Admitting: Endocrinology

## 2018-08-21 DIAGNOSIS — M810 Age-related osteoporosis without current pathological fracture: Secondary | ICD-10-CM

## 2019-02-20 ENCOUNTER — Other Ambulatory Visit: Payer: Self-pay

## 2019-02-20 ENCOUNTER — Encounter: Payer: Self-pay | Admitting: Internal Medicine

## 2019-02-20 ENCOUNTER — Ambulatory Visit (INDEPENDENT_AMBULATORY_CARE_PROVIDER_SITE_OTHER): Payer: Medicare Other | Admitting: Internal Medicine

## 2019-02-20 DIAGNOSIS — J3089 Other allergic rhinitis: Secondary | ICD-10-CM

## 2019-02-20 DIAGNOSIS — J302 Other seasonal allergic rhinitis: Secondary | ICD-10-CM

## 2019-02-20 NOTE — Assessment & Plan Note (Signed)
Better Spring this year despite rainy weather. Nasal sprays have worked well. She isn't sure abut needing refills yet, so she will contact us when ready. 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 She understands ENT can look at this if/ when she is ready.

## 2019-02-20 NOTE — Patient Instructions (Signed)
I'm glad you are doing well. Please let us know if you need refills for your nasal sprays.  The ENT doctors can talk with you about stabilizing the part of your nose that collapses, if you ever get tired of the nasal strips.  Please call if we can help

## 2019-02-20 NOTE — Progress Notes (Signed)
HPI female never smoker (widow-husband died of lung cancer) followed for allergic rhinitis, complicated by GERD IgE total 02/20/16- 7 EOS 0.2 Allergy Profile 05/12/15- negative with minor elevation only for cat dander, total IgE 12 --------------------------------------------------------------------------------------------------- 02/19/2018- 73 year old female never smoker (widow-husband died of lung cancer) followed for lung nodule, allergic rhinitis, complicated by GERD, hearing loss ----- Pt states she is doing well overall other than the usual congestion she has.  Nasalcrom, Singulair, Dymista Sleeps better with Dymista and Nasalcrom.  Feels L nostril sags closed lying down- pulls cheek laterally to open.  Chest ok.  Myringotomy tube R- working with Dr Thornell Mule, pending hearing aid. CT chest 02/27/2017 Negative.  No evidence of neoplasm or other active disease.  ROS-see HPI  + = positive Constitutional:   No-   weight loss, night sweats, fevers, chills, fatigue, lassitude. HEENT:   No-  headaches, difficulty swallowing, tooth/dental problems, sore throat,        sneezing, itching, ear ache, nasal congestion, +post nasal drip,  CV:  No-   chest pain, orthopnea, PND, swelling in lower extremities, anasarca,  dizziness, palpitations Resp: No-   shortness of breath with exertion or at rest.              No-   productive cough,   non-productive cough,  No- coughing up of blood.              No-   change in color of mucus.  No- wheezing.   Skin: No-   rash or lesions. GI:    heartburn, indigestion,no- abdominal pain, nausea, vomiting,  GU:  MS:  No-   joint pain or swelling.   Neuro-     nothing unusual Psych:  No- change in mood or affect. No depression or anxiety.  No memory loss.  OBJ- Physical Exam General- Alert, Oriented, Affect-appropriate, Distress- none acute, trim Skin- rash-none, lesions- none, excoriation- none Lymphadenopathy- none Head- atraumatic            Eyes- Gross vision  intact, PERRLA, conjunctivae and secretions clear            Ears- no problem with face-to-face conversation in quiet exam room. +R tube.             Nose- clear, no-Septal dev, mucus, polyps, erosion, perforation             Throat- Mallampati IV , mucosa clear , drainage- none, tonsils- atrophic Neck- flexible , trachea midline, no stridor , thyroid nl, carotid no bruit Chest - symmetrical excursion , unlabored           Heart/CV- RRR , no murmur , no gallop  , no rub, nl s1 s2                           - JVD- none , edema- none, stasis changes- none, varices- none           Lung- clear to P&A, wheeze- none, cough- none , dullness-none, rub- none           Chest wall-  Abd-  Br/ Gen/ Rectal- Not done, not indicated Extrem- cyanosis- none, clubbing, none, atrophy- none, strength- nl Neuro- grossly intact to observation  02/20/2019- Virtual Visit via Telephone Note  I connected with Mary Malone on 02/20/19 at 11:00 AM EDT by telephone and verified that I am speaking with the correct person using two identifiers.  Location: Patient: H Provider: Jenetta Downer  I discussed the limitations, risks, security and privacy concerns of performing an evaluation and management service by telephone and the availability of in person appointments. I also discussed with the patient that there may be a patient responsible charge related to this service. The patient expressed understanding and agreed to proceed.   History of Present Illness: 73 year old female never smoker (widow-husband died of lung cancer) followed for lung nodule, allergic rhinitis, complicated by GERD, hearing loss CT in 2018 was negative.  Had cellulitis L leg Rx'd by Dr Sharlet Salina w Keflex last Fall. Nasalcrom, Singulair,  Spring wasn't bad this year- meds controlled.  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 No hx ENT surgery. I suggested she might have PCP refill meds.  She wants to maintain connection here.   Observations/Objective:   Assessment and Plan: Allergic Rhinitis- controlled with current meds. Refill when needed Lung nodule- not seen on 2018 CT and likely benign/ resolved. Consider CXR next ov.   Follow Up Instructions:   1 year  I discussed the assessment and treatment plan with the patient. The patient was provided an opportunity to ask questions and all were answered. The patient agreed with the plan and demonstrated an understanding of the instructions.   The patient was advised to call back or seek an in-person evaluation if the symptoms worsen or if the condition fails to improve as anticipated.  I provided 18 minutes of non-face-to-face time during this encounter.   Baird Lyons, MD

## 2019-03-10 ENCOUNTER — Other Ambulatory Visit: Payer: Self-pay | Admitting: Internal Medicine

## 2019-03-13 ENCOUNTER — Other Ambulatory Visit: Payer: Self-pay | Admitting: Internal Medicine

## 2019-03-13 DIAGNOSIS — Z1231 Encounter for screening mammogram for malignant neoplasm of breast: Secondary | ICD-10-CM

## 2019-03-17 ENCOUNTER — Other Ambulatory Visit: Payer: Self-pay | Admitting: Internal Medicine

## 2019-04-16 ENCOUNTER — Other Ambulatory Visit: Payer: Self-pay

## 2019-04-20 ENCOUNTER — Ambulatory Visit (INDEPENDENT_AMBULATORY_CARE_PROVIDER_SITE_OTHER): Payer: Medicare Other | Admitting: Endocrinology

## 2019-04-20 ENCOUNTER — Encounter: Payer: Self-pay | Admitting: Endocrinology

## 2019-04-20 ENCOUNTER — Other Ambulatory Visit: Payer: Self-pay

## 2019-04-20 VITALS — BP 130/60 | HR 85 | Ht 65.0 in | Wt 124.0 lb

## 2019-04-20 DIAGNOSIS — M810 Age-related osteoporosis without current pathological fracture: Secondary | ICD-10-CM | POA: Diagnosis not present

## 2019-04-20 DIAGNOSIS — E559 Vitamin D deficiency, unspecified: Secondary | ICD-10-CM

## 2019-04-20 DIAGNOSIS — E039 Hypothyroidism, unspecified: Secondary | ICD-10-CM | POA: Diagnosis not present

## 2019-04-20 LAB — VITAMIN D 25 HYDROXY (VIT D DEFICIENCY, FRACTURES): VITD: 55.8 ng/mL (ref 30.00–100.00)

## 2019-04-20 LAB — TSH: TSH: 5.64 u[IU]/mL — ABNORMAL HIGH (ref 0.35–4.50)

## 2019-04-20 MED ORDER — LEVOTHYROXINE SODIUM 25 MCG PO TABS: 25.0000 ug | ORAL_TABLET | Freq: Every day | ORAL | 3 refills | Status: DC

## 2019-04-20 NOTE — Patient Instructions (Addendum)
Blood tests are requested for you today.  We'll let you know about the results.  Please do the infusion again next month, as scheduled.   Please come back for a follow-up appointment in 1 year.

## 2019-04-20 NOTE — Progress Notes (Signed)
   Subjective:    Patient ID: Mary Malone, female    DOB: 06/09/1946, 73 y.o.   MRN: [id number]  HPI Pt returns for f/u of osteoporosis:  Dx'ed: 2014 Secondary cause: none found.  Fractures: T-spine comp fx (non-traumatic, noted 2018 on chest CT) Past rx: fosamax (stopped due to heartburn, even with prilosec).  Current rx: reclast since 2018.  Last DEXA result (2019): worst T-score was -2.3 (left hip).   Other: she also take premarin vaginal cream.   Interval hx: pt states ongoing mild pain at the mid to lower back.  No assoc numbness; she does not know what vit-D dosage she takes.     Review of Systems Denies falls and LOC.  She reports cracking of fingernails.      Objective:   Physical Exam VITAL SIGNS:  See vs page GENERAL: no distress Gait: normal and steady.     Lab Results  Component Value Date   TSH 5.64 (H) 04/20/2019      Assessment & Plan:  Osteoporosis: Please continue the same reclast for now.  Based on next DEXA, she may need additional rx.  Vit-D def: due for recheck Hypothyroidism, new to me: this is very mild.  However, she requests rx, because of fingernail sxs.    Patient Instructions  Blood tests are requested for you today.  We'll let you know about the results.  Please do the infusion again next month, as scheduled.   Please come back for a follow-up appointment in 1 year.

## 2019-04-21 LAB — PTH, INTACT AND CALCIUM
Calcium: 10.2 mg/dL (ref 8.6–10.4)
PTH: 25 pg/mL (ref 14–64)

## 2019-04-29 ENCOUNTER — Other Ambulatory Visit: Payer: Self-pay

## 2019-04-29 ENCOUNTER — Ambulatory Visit
Admission: RE | Admit: 2019-04-29 | Discharge: 2019-04-29 | Disposition: A | Discharge status: Home / Self Care | Payer: Medicare Other | Source: Ambulatory Visit | Attending: Internal Medicine | Admitting: Internal Medicine

## 2019-04-29 DIAGNOSIS — Z1231 Encounter for screening mammogram for malignant neoplasm of breast: Secondary | ICD-10-CM

## 2019-06-02 ENCOUNTER — Ambulatory Visit: Payer: Medicare Other

## 2019-06-02 ENCOUNTER — Other Ambulatory Visit: Payer: Self-pay

## 2019-06-03 ENCOUNTER — Encounter: Payer: Self-pay | Admitting: Obstetrics and Gynecology

## 2019-06-03 ENCOUNTER — Ambulatory Visit (INDEPENDENT_AMBULATORY_CARE_PROVIDER_SITE_OTHER): Payer: Medicare Other | Admitting: Obstetrics and Gynecology

## 2019-06-03 VITALS — BP 154/68 | HR 84 | Temp 98.1°F | Resp 16 | Ht 65.0 in | Wt 123.8 lb

## 2019-06-03 DIAGNOSIS — Z01419 Encounter for gynecological examination (general) (routine) without abnormal findings: Secondary | ICD-10-CM | POA: Diagnosis not present

## 2019-06-03 NOTE — Patient Instructions (Signed)

## 2019-06-03 NOTE — Progress Notes (Signed)
73 y.o. G0P0 Married Caucasian female here for annual exam.    Denies vaginal bleeding.  No pelvic pain or vaginal discharge.  Stopped doing pelvic floor exercises. She states she waits too long to go and then has a hard time getting there on time.  Drinking coffee and Coke during the day.   Husband with early on set Alzheimer's and dx of prostate cancer.  Move to a new home as well.  PCP:  Pricilla Holm, MD   Patient's last menstrual period was 09/03/1996.           Sexually active: No.  The current method of family planning is post menopausal status.    Exercising: Yes.    housework, just moved recently--lots of unpacking Smoker:  no  Health Maintenance: Pap: 05-29-18 Neg, 05/10/16 Neg, 04-09-14 ASCUS:Neg HR HPV History of abnormal Pap:  Yes, 04-19-14 ASCUS:Neg HR HPV, Hx of Pos HPV in 2002? MMG: 04-29-19 3D/Neg/density C/BiRads1 Colonoscopy:06-16-14 normal with Dr. Lenise Herald due 06/2024  BMD: 08-21-18  Result :Osteopenia--Hx osteoporosis.  Doing Reclast.  TDaP:  03/2013 Gardasil:   n/a [id number] Hep C:02-13-16 Neg Screening Labs: PCP. Flu vaccine:  Done.    reports that she has never smoked. She has never used smokeless tobacco. She reports that she does not drink alcohol or use drugs.  Past Medical History:  Diagnosis Date  . ALLERGIC RHINITIS   . Back pain   . Dense breast   . DEPRESSION   . Fibroid   . GERD   . Hearing loss   . HYPERLIPIDEMIA    diet control efforts  . Osteoarthritis   . Osteoporosis   . PMB (postmenopausal bleeding) 12/2001   polyps  . Thyroid disease    hypothyroidism  . URINARY INCONTINENCE    OAB  . Wisdom teeth removed 1866/1967    Past Surgical History:  Procedure Laterality Date  . BREAST CYST ASPIRATION  12/05/2001  . CATARACT EXTRACTION Bilateral 07/2012, 09/2012  . DILATION AND CURETTAGE OF UTERUS  2003  . HYSTEROSCOPY  01/2002   resection, sm fibroid  . Left knee surgey    . MENISCUS REPAIR     left knee 2010  . REFRACTIVE  SURGERY  02/2016  . WISDOM TOOTH EXTRACTION      Current Outpatient Medications  Medication Sig Dispense Refill  . aspirin 81 MG tablet Take 81 mg by mouth daily.    . Azelastine-Fluticasone 137-50 MCG/ACT SUSP SHAKE LIQUID AND USE 1 TO 2 SPRAYS IN EACH NOSTRIL AT BEDTIME 23 g 11  . Calcium Citrate-Vitamin D (CITRACAL + D PO) Take 1,200 mg by mouth daily.    . Cholecalciferol (VITAMIN D3) 2000 units TABS Take 1 tablet by mouth daily.    Marland Kitchen conjugated estrogens (PREMARIN) vaginal cream USE 1/2 GRAM VAGINALLY TWICE WEEKLY 30 g 1  . cromolyn (NASALCROM) 5.2 MG/ACT nasal spray Place 1 spray into both nostrils once. Taking once in the morning 06/19    . famotidine (PEPCID) 10 MG tablet Take 10 mg by mouth once.     Marland Kitchen levothyroxine (SYNTHROID) 25 MCG tablet Take 1 tablet (25 mcg total) by mouth daily before breakfast. 90 tablet 3  . montelukast (SINGULAIR) 10 MG tablet TAKE 1 TABLET(10 MG) BY MOUTH AT BEDTIME 30 tablet 10  . Multiple Vitamins-Minerals (CENTRUM SILVER) tablet Take 1 tablet by mouth daily.    . Omega-3 Fatty Acids (FISH OIL) 1200 MG CAPS Take by mouth daily.    Marland Kitchen PREVIDENT 5000 BOOSTER PLUS 1.1 %  PSTE 3 (three) times daily as needed.    . pseudoephedrine (SUDAFED) 120 MG 12 hr tablet Take 120 mg by mouth once.     . vitamin E 400 UNIT capsule Take 800 Units by mouth daily.     . Zoledronic Acid (RECLAST IV) Inject into the vein. IV infusion yearly     No current facility-administered medications for this visit.     Family History  Problem Relation Age of Onset  . Alzheimer's disease Mother   . Allergies Mother   . Breast cancer Mother 75  . Dementia Mother   . Lung cancer Father 45  . Allergies Brother   . Diabetes Brother   . Transient ischemic attack Maternal Grandmother   . Peripheral Artery Disease Maternal Grandmother        s/p B leg amputation  . Stroke Maternal Grandmother   . Tuberculosis Maternal Grandfather   . Diabetes Brother 34  . Alzheimer's disease  Brother 47       early onset  . Sudden death Paternal Grandmother 68       unknown  . Breast cancer Paternal Aunt   . Osteoporosis Neg Hx     Review of Systems  All other systems reviewed and are negative.   Exam:   BP (!) 154/68   Pulse 84   Temp 98.1 F (36.7 C) (Temporal)   Resp 16   Ht 5\' 5"  (1.651 m)   Wt 123 lb 12.8 oz (56.2 kg)   LMP 09/03/1996   BMI 20.60 kg/m     General appearance: alert, cooperative and appears stated age Head: normocephalic, without obvious abnormality, atraumatic Neck: no adenopathy, supple, symmetrical, trachea midline and thyroid normal to inspection and palpation Lungs: clear to auscultation bilaterally Breasts: normal appearance, no masses or tenderness, No nipple retraction or dimpling, No nipple discharge or bleeding, No axillary adenopathy Heart: regular rate and rhythm Abdomen: soft, non-tender; no masses, no organomegaly Extremities: extremities normal, atraumatic, no cyanosis or edema Skin: skin color, texture, turgor normal. No rashes or lesions Lymph nodes: cervical, supraclavicular, and axillary nodes normal. Neurologic: grossly normal  Pelvic: External genitalia:  no lesions              No abnormal inguinal nodes palpated.              Urethra:  normal appearing urethra with no masses, tenderness or lesions              Bartholins and Skenes: normal                 Vagina: normal appearing vagina with normal color and discharge, no lesions              Cervix: no lesions              Pap taken: No. Bimanual Exam:  Uterus:  normal size, contour, position, consistency, mobility, non-tender              Adnexa: no mass, fullness, tenderness              Rectal exam: Yes.  .  Confirms.              Anus:  normal sphincter tone, no lesions  Chaperone was present for exam.  Assessment:   Well woman visit with normal exam. Overactive bladder.  Off meds. Hx incomplete uterovaginal prolapse. Osteoporosis. Doing Reclast  injections.  Compression fracture of thoracic/lumbar region. Vulvovaginal atrophy. FH of breast cancer  in mother.  Plan: Mammogram screening discussed. Self breast awareness reviewed. Pap and HR HPV as above. Guidelines for Calcium, Vitamin D, regular exercise program including cardiovascular and weight bearing exercise. We discussed bladder irritants.  Follow up annually and prn.   After visit summary provided.

## 2019-06-09 ENCOUNTER — Other Ambulatory Visit: Payer: Self-pay

## 2019-06-09 ENCOUNTER — Ambulatory Visit (INDEPENDENT_AMBULATORY_CARE_PROVIDER_SITE_OTHER): Payer: Medicare Other | Admitting: Nutrition

## 2019-06-09 DIAGNOSIS — M810 Age-related osteoporosis without current pathological fracture: Secondary | ICD-10-CM

## 2019-06-09 NOTE — Progress Notes (Signed)
Per Dr. Cordelia Pen note on 08/22/18, and after signing the consent form, and an IV was started with a 22G catheter into her left arm.Normal saline was infused into her left arm for 5 min., until it was determined that the IV in place, Reclast was then started at 2:15 PM and infused until 2:50PM.  Normal saline was infused to flush the tubing, then the catheter was removed.  Patient reported no discomfort during the procedure, and site showed no signes of redness or swelling. Patient was encouraged to continue to drink 4  6 ounce glasses of water today, and to continue taking her calcium and vitamin D per Dr. Cordelia Pen orders

## 2019-06-09 NOTE — Patient Instructions (Signed)
Drink 4-6 glasses of water today Take Calcium and Vitamin D per Dr. Cordelia Pen order

## 2019-08-21 ENCOUNTER — Other Ambulatory Visit: Payer: Self-pay | Admitting: Obstetrics and Gynecology

## 2019-08-21 NOTE — Telephone Encounter (Signed)
Medication refill request: premarin  Last AEX:  06-03-2019 BS Next AEX: 06-08-20 Last MMG (if hormonal medication request): 04-29-2019 density C/BIRADS 1 negative  Refill authorized: Today, please advise.   Medication pended for #30g, 1RF. Please refill if appropriate.

## 2019-09-04 HISTORY — DX: Hypercalcemia: E83.52

## 2020-02-18 ENCOUNTER — Other Ambulatory Visit: Payer: Self-pay

## 2020-02-18 ENCOUNTER — Encounter: Payer: Self-pay | Admitting: Internal Medicine

## 2020-02-18 ENCOUNTER — Ambulatory Visit (INDEPENDENT_AMBULATORY_CARE_PROVIDER_SITE_OTHER): Payer: Medicare Other

## 2020-02-18 ENCOUNTER — Ambulatory Visit (INDEPENDENT_AMBULATORY_CARE_PROVIDER_SITE_OTHER): Payer: Medicare Other | Admitting: Internal Medicine

## 2020-02-18 VITALS — BP 118/74 | HR 77 | Temp 97.2°F | Ht 65.0 in | Wt 130.4 lb

## 2020-02-18 DIAGNOSIS — J302 Other seasonal allergic rhinitis: Secondary | ICD-10-CM

## 2020-02-18 DIAGNOSIS — R911 Solitary pulmonary nodule: Secondary | ICD-10-CM | POA: Diagnosis not present

## 2020-02-18 DIAGNOSIS — J3089 Other allergic rhinitis: Secondary | ICD-10-CM

## 2020-02-18 MED ORDER — MONTELUKAST SODIUM 10 MG PO TABS: ORAL_TABLET | ORAL | 12 refills | Status: DC

## 2020-02-18 MED ORDER — CROMOLYN SODIUM 5.2 MG/ACT NA AERS: 1.0000 | INHALATION_SPRAY | Freq: Once | NASAL | 12 refills | Status: DC

## 2020-02-18 MED ORDER — AZELASTINE-FLUTICASONE 137-50 MCG/ACT NA SUSP: NASAL | 12 refills | Status: DC

## 2020-02-18 NOTE — Progress Notes (Signed)
HPI female never smoker (widow-husband died of lung cancer) followed for allergic rhinitis, complicated by GERD IgE total 02/20/16- 7 EOS 0.2 Allergy Profile 05/12/15- negative with minor elevation only for cat dander, total IgE 12 --------------------------------------------------------------------------------------------------- 02/20/2019- Virtual Visit via Telephone Note  History of Present Illness: 74 year old female never smoker (widow-husband died of lung cancer) followed for lung nodule, allergic rhinitis, complicated by GERD, hearing loss CT in 2018 was negative.  Had cellulitis L leg Rx'd by Dr Sharlet Salina w Keflex last Fall. Nasalcrom, Singulair,  Spring wasn't bad this year- meds controlled.  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 No hx ENT surgery. I suggested she might have PCP refill meds. She wants to maintain connection here.   Observations/Objective:   Assessment and Plan: Allergic Rhinitis- controlled with current meds. Refill when needed Lung nodule- not seen on 2018 CT and likely benign/ resolved. Consider CXR next ov.   Follow Up Instructions:  1 year ---------------------------- 67/`29/37- 74 year old female never smoker (widow-husband died of lung cancer) followed for lung nodule, allergic rhinitis, complicated by GERD, hearing loss CT in 2018 was negative. Nasalcrom, Singulair,  Had 2 Phizer Covax Doing very well with no significant exacerbation. Got through Spring pollen ok. Mask and social distancing have helped. Would like to try off singulair. Still has dymista and nasalcrom nasal sprays when needed. Rarley needs sudafed.  Will update imaging for this former smoker with past hx lung nodules.   ROS-see HPI  + = positive Constitutional:   No-   weight loss, night sweats, fevers, chills, fatigue, lassitude. HEENT:   No-  headaches, difficulty swallowing, tooth/dental problems, sore throat,         sneezing, itching, ear ache, nasal congestion, +post nasal drip,  CV:  No-   chest pain, orthopnea, PND, swelling in lower extremities, anasarca,  dizziness, palpitations Resp: No-   shortness of breath with exertion or at rest.              No-   productive cough,   non-productive cough,  No- coughing up of blood.              No-   change in color of mucus.  No- wheezing.   Skin: No-   rash or lesions. GI:    heartburn, indigestion,no- abdominal pain, nausea, vomiting,  GU:  MS:  No-   joint pain or swelling.   Neuro-     nothing unusual Psych:  No- change in mood or affect. No depression or anxiety.  No memory loss.  OBJ- Physical Exam General- Alert, Oriented, Affect-appropriate, Distress- none acute, trim Skin- rash-none, lesions- none, excoriation- none Lymphadenopathy- none Head- atraumatic            Eyes- Gross vision intact, PERRLA, conjunctivae and secretions clear            Ears- no problem with face-to-face conversation in quiet exam room. +R tube.             Nose- clear, no-Septal dev, mucus, polyps, erosion, perforation             Throat- Mallampati IV , mucosa clear , drainage- none, tonsils- atrophic Neck- flexible , trachea midline, no stridor , thyroid nl, carotid no bruit Chest - symmetrical excursion , unlabored           Heart/CV- RRR , no murmur , no gallop  , no rub, nl s1 s2                           -  JVD- none , edema- none, stasis changes- none, varices- none           Lung- clear to P&A, wheeze- none, cough- none , dullness-none, rub- none           Chest wall-  Abd-  Br/ Gen/ Rectal- Not done, not indicated Extrem- cyanosis- none, clubbing, none, atrophy- none, strength- nl Neuro- grossly intact to observation     

## 2020-02-18 NOTE — Patient Instructions (Signed)
Script printed for montelukast to fill if needed  Scripts sent refilling nasalcrom and dymista  Order- CXR   Dx remote lung nodule  Please call if we can help

## 2020-02-19 ENCOUNTER — Telehealth: Payer: Self-pay | Admitting: Internal Medicine

## 2020-02-19 NOTE — Telephone Encounter (Signed)
Deneise Lever, MD  02/19/2020 9:30 AM EDT     CXR- stable and clear with no visible lung nodules.    Called and spoke with pt letting her know the results of the cxr and she verbalized understanding. Nothing further needed.

## 2020-02-20 NOTE — Assessment & Plan Note (Signed)
Former smoker. Remote nodule on imaging, had resolved. Plan- CXR

## 2020-02-20 NOTE — Assessment & Plan Note (Signed)
Adequate control with seasonal use of nasal sprays and sudafed. Plan- ok to stop singulair for observation, but she asks refill script printed to hold.

## 2020-03-21 ENCOUNTER — Other Ambulatory Visit: Payer: Self-pay | Admitting: Internal Medicine

## 2020-03-21 DIAGNOSIS — Z1231 Encounter for screening mammogram for malignant neoplasm of breast: Secondary | ICD-10-CM

## 2020-04-18 ENCOUNTER — Other Ambulatory Visit: Payer: Self-pay | Admitting: Endocrinology

## 2020-04-19 ENCOUNTER — Other Ambulatory Visit: Payer: Self-pay

## 2020-04-19 ENCOUNTER — Encounter: Payer: Self-pay | Admitting: Endocrinology

## 2020-04-19 ENCOUNTER — Ambulatory Visit (INDEPENDENT_AMBULATORY_CARE_PROVIDER_SITE_OTHER): Payer: Medicare Other | Admitting: Endocrinology

## 2020-04-19 ENCOUNTER — Ambulatory Visit
Admission: RE | Admit: 2020-04-19 | Discharge: 2020-04-19 | Disposition: A | Discharge status: Home / Self Care | Payer: Medicare Other | Source: Ambulatory Visit | Attending: Endocrinology | Admitting: Endocrinology

## 2020-04-19 VITALS — BP 142/78 | HR 76 | Ht 65.0 in | Wt 132.0 lb

## 2020-04-19 DIAGNOSIS — M533 Sacrococcygeal disorders, not elsewhere classified: Secondary | ICD-10-CM

## 2020-04-19 DIAGNOSIS — M810 Age-related osteoporosis without current pathological fracture: Secondary | ICD-10-CM | POA: Diagnosis not present

## 2020-04-19 DIAGNOSIS — R200 Anesthesia of skin: Secondary | ICD-10-CM | POA: Diagnosis not present

## 2020-04-19 LAB — BASIC METABOLIC PANEL
BUN: 22 mg/dL (ref 6–23)
CO2: 32 mEq/L (ref 19–32)
Calcium: 10.3 mg/dL (ref 8.4–10.5)
Chloride: 104 mEq/L (ref 96–112)
Creatinine, Ser: 0.99 mg/dL (ref 0.40–1.20)
GFR: 54.96 mL/min — ABNORMAL LOW (ref >60.00)
Glucose, Bld: 80 mg/dL (ref 70–99)
Potassium: 4.6 mEq/L (ref 3.5–5.1)
Sodium: 142 mEq/L (ref 135–145)

## 2020-04-19 LAB — VITAMIN B12: Vitamin B-12: >1526 pg/mL — ABNORMAL HIGH (ref 211–911)

## 2020-04-19 LAB — VITAMIN D 25 HYDROXY (VIT D DEFICIENCY, FRACTURES): VITD: 40.44 ng/mL (ref 30.00–100.00)

## 2020-04-19 NOTE — Progress Notes (Signed)
Subjective:    Patient ID: Mary Malone, female    DOB: 1945/11/28, 74 y.o.   MRN: [id number]  HPI Pt returns for f/u of osteoporosis:  Dx'ed: 2014 Secondary cause: none found.  Fractures: T-spine comp fx (non-traumatic, noted 2018 on chest CT) Past rx: fosamax (stopped due to heartburn, even with prilosec).  Current rx: reclast since 2018.  Last DEXA result (2019): worst T-score was -2.3 (left hip).   Other: she also take premarin vaginal cream.   Interval hx: pt states 3 days of increase in chronic pain at the sacral area.  No assoc numbness; she takes Vit-D, 1000 units/d. Past Medical History:  Diagnosis Date  . ALLERGIC RHINITIS   . Back pain   . Dense breast   . DEPRESSION   . Fibroid   . GERD   . Hearing loss   . HYPERLIPIDEMIA    diet control efforts  . Osteoarthritis   . Osteoporosis   . PMB (postmenopausal bleeding) 12/2001   polyps  . Thyroid disease    hypothyroidism  . URINARY INCONTINENCE    OAB  . Wisdom teeth removed 1866/1967    Past Surgical History:  Procedure Laterality Date  . BREAST CYST ASPIRATION  12/05/2001  . CATARACT EXTRACTION Bilateral 07/2012, 09/2012  . DILATION AND CURETTAGE OF UTERUS  2003  . HYSTEROSCOPY  01/2002   resection, sm fibroid  . Left knee surgey    . MENISCUS REPAIR     left knee 2010  . REFRACTIVE SURGERY  02/2016  . WISDOM TOOTH EXTRACTION      Social History   Socioeconomic History  . Marital status: Married    Spouse name: Not on file  . Number of children: Not on file  . Years of education: Not on file  . Highest education level: Not on file  Occupational History  . Not on file  Tobacco Use  . Smoking status: Never Smoker  . Smokeless tobacco: Never Used  Vaping Use  . Vaping Use: Never used  Substance and Sexual Activity  . Alcohol use: No    Alcohol/week: 1.0 standard drink    Types: 1 Standard drinks or equivalent per week  . Drug use: No  . Sexual activity: Not Currently    Partners: Male     Birth control/protection: Post-menopausal  Other Topics Concern  . Not on file  Social History Narrative  . Not on file   Social Determinants of Health   Financial Resource Strain:   . Difficulty of Paying Living Expenses: Not on file  Food Insecurity:   . Worried About Charity fundraiser in the Last Year: Not on file  . Ran Out of Food in the Last Year: Not on file  Transportation Needs:   . Lack of Transportation (Medical): Not on file  . Lack of Transportation (Non-Medical): Not on file  Physical Activity:   . Days of Exercise per Week: Not on file  . Minutes of Exercise per Session: Not on file  Stress:   . Feeling of Stress : Not on file  Social Connections:   . Frequency of Communication with Friends and Family: Not on file  . Frequency of Social Gatherings with Friends and Family: Not on file  . Attends Religious Services: Not on file  . Active Member of Clubs or Organizations: Not on file  . Attends Archivist Meetings: Not on file  . Marital Status: Not on file  Intimate Partner Violence:   .  Fear of Current or Ex-Partner: Not on file  . Emotionally Abused: Not on file  . Physically Abused: Not on file  . Sexually Abused: Not on file    Current Outpatient Medications on File Prior to Visit  Medication Sig Dispense Refill  . aspirin 81 MG tablet Take 81 mg by mouth daily.    . Azelastine-Fluticasone 137-50 MCG/ACT SUSP 1-2 puffs each nostril twice daily as needed 23 g 12  . Calcium Citrate-Vitamin D (CITRACAL + D PO) Take 1,200 mg by mouth daily.    . Cholecalciferol (VITAMIN D3) 2000 units TABS Take 1 tablet by mouth daily.    Marland Kitchen conjugated estrogens (PREMARIN) vaginal cream USE 0.5 GRAM VAGINALLY 2 TIMES A WEEK 30 g 1  . levothyroxine (SYNTHROID) 25 MCG tablet TAKE 1 TABLET(25 MCG) BY MOUTH DAILY BEFORE BREAKFAST 90 tablet 3  . montelukast (SINGULAIR) 10 MG tablet TAKE 1 TABLET(10 MG) BY MOUTH AT BEDTIME 30 tablet 12  . Multiple Vitamins-Minerals  (CENTRUM SILVER) tablet Take 1 tablet by mouth daily.    . Omega-3 Fatty Acids (FISH OIL) 1200 MG CAPS Take by mouth daily.    Marland Kitchen PREVIDENT 5000 BOOSTER PLUS 1.1 % PSTE 3 (three) times daily as needed.    . pseudoephedrine (SUDAFED) 120 MG 12 hr tablet Take 120 mg by mouth once.     . vitamin E 400 UNIT capsule Take 800 Units by mouth daily.     . Zoledronic Acid (RECLAST IV) Inject into the vein. IV infusion yearly    . cromolyn (NASALCROM) 5.2 MG/ACT nasal spray Place 1 spray into both nostrils once for 1 dose. Taking once in the morning 06/19 26 mL 12   No current facility-administered medications on file prior to visit.    No Known Allergies  Family History  Problem Relation Age of Onset  . Alzheimer's disease Mother   . Allergies Mother   . Breast cancer Mother 33  . Dementia Mother   . Lung cancer Father 92  . Allergies Brother   . Diabetes Brother   . Transient ischemic attack Maternal Grandmother   . Peripheral Artery Disease Maternal Grandmother        s/p B leg amputation  . Stroke Maternal Grandmother   . Tuberculosis Maternal Grandfather   . Diabetes Brother 6  . Alzheimer's disease Brother 37       early onset  . Sudden death Paternal Grandmother 75       unknown  . Breast cancer Paternal Aunt   . Osteoporosis Neg Hx     BP (!) 142/78   Pulse 76   Ht 5\' 5"  (1.651 m)   Wt 132 lb (59.9 kg)   LMP 09/03/1996   SpO2 98%   BMI 21.97 kg/m    Review of Systems She also has pain at the fingertips.      Objective:   Physical Exam VITAL SIGNS:  See vs page GENERAL: no distress Sacral area: sacral area is nontender GAIT: normal and steady   Lab Results  Component Value Date   PTH 26 04/19/2020   CALCIUM 10.5 (H) 04/19/2020   CALCIUM 10.3 04/19/2020       Assessment & Plan:  Sacral pain, new, uncertain etiology.  Osteoporosis: due for recheck.    Patient Instructions  Blood tests and x-rays are requested for you today.  We'll let you know about  the results.   Please have the Reclast infusion this October as scheduled.   Take calcium 1200  mg per day, and vitamin-D, at least 400 units per day.   Please come back for a follow-up appointment in 1 year.

## 2020-04-19 NOTE — Patient Instructions (Addendum)
Blood tests and x-rays are requested for you today.  We'll let you know about the results.   Please have the Reclast infusion this October as scheduled.   Take calcium 1200 mg per day, and vitamin-D, at least 400 units per day.   Please come back for a follow-up appointment in 1 year.

## 2020-04-20 ENCOUNTER — Other Ambulatory Visit: Payer: Self-pay | Admitting: Endocrinology

## 2020-04-20 LAB — PTH, INTACT AND CALCIUM
Calcium: 10.5 mg/dL — ABNORMAL HIGH (ref 8.6–10.4)
PTH: 26 pg/mL (ref 14–64)

## 2020-04-21 ENCOUNTER — Encounter: Payer: Self-pay | Admitting: Endocrinology

## 2020-05-03 ENCOUNTER — Ambulatory Visit: Payer: Medicare Other | Attending: Critical Care Medicine

## 2020-05-03 DIAGNOSIS — Z23 Encounter for immunization: Secondary | ICD-10-CM

## 2020-05-03 NOTE — Progress Notes (Signed)
   Covid-19 Vaccination Clinic  Name:  Mary Malone    MRN: [id number] DOB: 1946-03-24  05/03/2020  [name] was observed post Covid-19 immunization for 15 minutes without incident. She was provided with Vaccine Information Sheet and instruction to access the V-Safe system.   [name] was instructed to call 911 with any severe reactions post vaccine: Marland Kitchen Difficulty breathing  . Swelling of face and throat  . A fast heartbeat  . A bad rash all over body  . Dizziness and weakness

## 2020-05-05 ENCOUNTER — Ambulatory Visit: Payer: Medicare Other

## 2020-05-24 ENCOUNTER — Other Ambulatory Visit: Payer: Self-pay

## 2020-05-24 ENCOUNTER — Other Ambulatory Visit (INDEPENDENT_AMBULATORY_CARE_PROVIDER_SITE_OTHER): Payer: Medicare Other

## 2020-05-24 LAB — HEPATIC FUNCTION PANEL
ALT: 16 U/L (ref 0–35)
AST: 19 U/L (ref 0–37)
Albumin: 4.5 g/dL (ref 3.5–5.2)
Alkaline Phosphatase: 46 U/L (ref 39–117)
Bilirubin, Direct: 0.1 mg/dL (ref 0.0–0.3)
Total Bilirubin: 0.5 mg/dL (ref 0.2–1.2)
Total Protein: 7.5 g/dL (ref 6.0–8.3)

## 2020-06-02 LAB — PROTEIN ELECTROPHORESIS, SERUM
Albumin ELP: 4.4 g/dL (ref 3.8–4.8)
Alpha 1: 0.3 g/dL (ref 0.2–0.3)
Alpha 2: 0.7 g/dL (ref 0.5–0.9)
Beta 2: 0.3 g/dL (ref 0.2–0.5)
Beta Globulin: 0.4 g/dL (ref 0.4–0.6)
Gamma Globulin: 1 g/dL (ref 0.8–1.7)
Total Protein: 7.1 g/dL (ref 6.1–8.1)

## 2020-06-02 LAB — VITAMIN D 1,25 DIHYDROXY
Vitamin D 1, 25 (OH)2 Total: 33 pg/mL (ref 18–72)
Vitamin D2 1, 25 (OH)2: <8 pg/mL
Vitamin D3 1, 25 (OH)2: 33 pg/mL

## 2020-06-02 LAB — PTH, INTACT AND CALCIUM
Calcium: 10.5 mg/dL — ABNORMAL HIGH (ref 8.6–10.4)
PTH: 28 pg/mL (ref 14–64)

## 2020-06-02 LAB — VITAMIN A: Vitamin A (Retinoic Acid): 75 ug/dL (ref 38–98)

## 2020-06-02 LAB — PTH-RELATED PEPTIDE: PTH-Related Protein (PTH-RP): 13 pg/mL (ref 11–20)

## 2020-06-03 ENCOUNTER — Other Ambulatory Visit: Payer: Self-pay | Admitting: Endocrinology

## 2020-06-06 ENCOUNTER — Other Ambulatory Visit: Payer: Self-pay

## 2020-06-08 ENCOUNTER — Other Ambulatory Visit (HOSPITAL_COMMUNITY)
Admission: RE | Admit: 2020-06-08 | Discharge: 2020-06-08 | Disposition: A | Discharge status: Home / Self Care | Payer: Medicare Other | Source: Ambulatory Visit | Attending: Obstetrics and Gynecology | Admitting: Obstetrics and Gynecology

## 2020-06-08 ENCOUNTER — Other Ambulatory Visit: Payer: Medicare Other

## 2020-06-08 ENCOUNTER — Encounter: Payer: Self-pay | Admitting: Obstetrics and Gynecology

## 2020-06-08 ENCOUNTER — Ambulatory Visit (INDEPENDENT_AMBULATORY_CARE_PROVIDER_SITE_OTHER): Payer: Medicare Other | Admitting: Obstetrics and Gynecology

## 2020-06-08 ENCOUNTER — Other Ambulatory Visit: Payer: Self-pay

## 2020-06-08 VITALS — BP 134/76 | HR 70 | Resp 14 | Ht 65.0 in | Wt 129.0 lb

## 2020-06-08 DIAGNOSIS — Z01419 Encounter for gynecological examination (general) (routine) without abnormal findings: Secondary | ICD-10-CM | POA: Insufficient documentation

## 2020-06-08 MED ORDER — PREMARIN 0.625 MG/GM VA CREA
TOPICAL_CREAM | VAGINAL | 1 refills | Status: DC
Start: 2020-06-08 — End: 2021-06-14

## 2020-06-08 NOTE — Progress Notes (Signed)
74 y.o. G0P0 Married Caucasian female here for annual exam.    Being evaluated for elevated calcium.  Just completed a 24 hour urinary collection.  Seeing Dr. Loanne Drilling.   Using Premarin vaginal cream inconsistently.  Has dryness.   Received her Covid booster.  Received her flu vaccine also.   PCP: Pricilla Holm, MD  Patient's last menstrual period was 09/03/1996.           Sexually active: No.  The current method of family planning is post menopausal status.    Exercising: Yes.    housework, walking and taking care of husband who has alzheimer's Smoker:  no  Health Maintenance: Pap:  05-29-18 Neg,05/10/16 Neg,04-09-14 ASCUS:Neg HR HPV History of abnormal Pap:  Yes,04-19-14 ASCUS:Neg HR HPV, Hx of Pos HPV in 2002? MMG: 04-29-19  3D/Neg/density C/Birads1--appt. 06-17-20 Colonoscopy::06-16-14 normal with Dr. Lenise Herald due 06/2024   BMD:  08-21-18  Result : Osteopenia--Hx osteoporosis.  Doing Reclast. --Appt. With Endocrinologist soon TDaP:  03/2013 Gardasil:   no YQI:HKVQQ Hep C: 02-13-16 Neg Screening Labs:  PCP and endocrinology.    reports that she has never smoked. She has never used smokeless tobacco. She reports that she does not drink alcohol and does not use drugs.  Past Medical History:  Diagnosis Date  . ALLERGIC RHINITIS   . Back pain   . Dense breast   . DEPRESSION   . Fibroid   . GERD   . Hearing loss   . HYPERLIPIDEMIA    diet control efforts  . Osteoarthritis   . Osteoporosis   . PMB (postmenopausal bleeding) 12/2001   polyps  . Serum calcium elevated 2021   sees Dr.Ellison/pulmonology with [place]  . Thyroid disease    hypothyroidism  . URINARY INCONTINENCE    OAB  . Wisdom teeth removed 1866/1967    Past Surgical History:  Procedure Laterality Date  . BREAST CYST ASPIRATION  12/05/2001  . CATARACT EXTRACTION Bilateral 07/2012, 09/2012  . DILATION AND CURETTAGE OF UTERUS  2003  . HYSTEROSCOPY  01/2002   resection, sm fibroid  . Left knee  surgey    . MENISCUS REPAIR     left knee 2010  . REFRACTIVE SURGERY  02/2016  . WISDOM TOOTH EXTRACTION      Current Outpatient Medications  Medication Sig Dispense Refill  . aspirin 81 MG tablet Take 81 mg by mouth daily.    . Azelastine-Fluticasone 137-50 MCG/ACT SUSP 1-2 puffs each nostril twice daily as needed 23 g 12  . Calcium Citrate-Vitamin D (CITRACAL + D PO) Take 1,200 mg by mouth daily.    . Cholecalciferol (VITAMIN D3) 2000 units TABS Take 1 tablet by mouth daily.    Marland Kitchen conjugated estrogens (PREMARIN) vaginal cream USE 0.5 GRAM VAGINALLY 2 TIMES A WEEK 30 g 1  . levothyroxine (SYNTHROID) 25 MCG tablet TAKE 1 TABLET(25 MCG) BY MOUTH DAILY BEFORE BREAKFAST 90 tablet 3  . montelukast (SINGULAIR) 10 MG tablet TAKE 1 TABLET(10 MG) BY MOUTH AT BEDTIME 30 tablet 12  . Multiple Vitamins-Minerals (CENTRUM SILVER) tablet Take 1 tablet by mouth daily.    . Omega-3 Fatty Acids (FISH OIL) 1200 MG CAPS Take by mouth daily.    Marland Kitchen PREVIDENT 5000 BOOSTER PLUS 1.1 % PSTE 3 (three) times daily as needed.    . pseudoephedrine (SUDAFED) 120 MG 12 hr tablet Take 120 mg by mouth once.     . vitamin E 400 UNIT capsule Take 800 Units by mouth daily.     Marland Kitchen  Zoledronic Acid (RECLAST IV) Inject into the vein. IV infusion yearly    . cromolyn (NASALCROM) 5.2 MG/ACT nasal spray Place 1 spray into both nostrils once for 1 dose. Taking once in the morning 06/19 26 mL 12   No current facility-administered medications for this visit.    Family History  Problem Relation Age of Onset  . Alzheimer's disease Mother   . Allergies Mother   . Breast cancer Mother 11  . Dementia Mother   . Lung cancer Father 59  . Allergies Brother   . Diabetes Brother   . Transient ischemic attack Maternal Grandmother   . Peripheral Artery Disease Maternal Grandmother        s/p B leg amputation  . Stroke Maternal Grandmother   . Tuberculosis Maternal Grandfather   . Diabetes Brother 48  . Alzheimer's disease Brother 78        early onset  . Sudden death Paternal Grandmother 88       unknown  . Breast cancer Paternal Aunt   . Osteoporosis Neg Hx     Review of Systems  All other systems reviewed and are negative.   Exam:   BP 134/76   Pulse 70   Resp 14   Ht 5\' 5"  (1.651 m)   Wt 129 lb (58.5 kg)   LMP 09/03/1996   BMI 21.47 kg/m     General appearance: alert, cooperative and appears stated age Head: normocephalic, without obvious abnormality, atraumatic Neck: no adenopathy, supple, symmetrical, trachea midline and thyroid normal to inspection and palpation Lungs: clear to auscultation bilaterally Breasts: normal appearance, no masses or tenderness, No nipple retraction or dimpling, No nipple discharge or bleeding, No axillary adenopathy Heart: regular rate and rhythm Abdomen: soft, non-tender; no masses, no organomegaly Extremities: extremities normal, atraumatic, no cyanosis or edema Skin: skin color, texture, turgor normal. No rashes or lesions Lymph nodes: cervical, supraclavicular, and axillary nodes normal. Neurologic: grossly normal  Pelvic: External genitalia:  no lesions              No abnormal inguinal nodes palpated.              Urethra:  normal appearing urethra with no masses, tenderness or lesions              Bartholins and Skenes: normal                 Vagina: normal appearing vagina with normal color and discharge, no lesions              Cervix: no lesions              Pap taken: Yes.   Bimanual Exam:  Uterus:  normal size, contour, position, consistency, mobility, non-tender              Adnexa: no mass, fullness, tenderness              Rectal exam: Yes.  .  Confirms.              Anus:  normal sphincter tone, no lesions  Chaperone was present for exam.  Assessment:   Well woman visit with normal exam. Overactive bladder.  Off meds. Hx incomplete uterovaginal prolapse. Osteoporosis.  Hypercalcemia. Compression fracture of thoracic/lumbar region. Vulvovaginal  atrophy. FH of breast cancer in mother. Caregiver status.  Plan: Mammogram screening discussed. Self breast awareness reviewed. Pap and HR HPV as above. Guidelines for Calcium, Vitamin D, regular exercise program including cardiovascular and  weight bearing exercise. Refill of Premarin vaginal cream.  I discussed possible effect on breast cancer. Follow up annually and prn.   After visit summary provided.

## 2020-06-08 NOTE — Patient Instructions (Signed)

## 2020-06-09 LAB — CALCIUM, URINE, 24 HOUR: Calcium, 24H Urine: 188 mg/24 h

## 2020-06-10 LAB — CYTOLOGY - PAP
Adequacy: ABSENT
Diagnosis: NEGATIVE

## 2020-06-14 ENCOUNTER — Telehealth: Payer: Self-pay | Admitting: Endocrinology

## 2020-06-14 ENCOUNTER — Ambulatory Visit: Payer: Medicare Other

## 2020-06-14 NOTE — Telephone Encounter (Signed)
Patient had an appointment for reclast on the schedule for today that never got rescheduled. She came to her appointment (that was made a year ago) to check in and I informed her that Vaughan Basta was handling the reclasts and she will give her a call when she returns to the office and I was very sorry no one informed her of this sooner.

## 2020-06-17 ENCOUNTER — Other Ambulatory Visit: Payer: Self-pay

## 2020-06-17 ENCOUNTER — Ambulatory Visit
Admission: RE | Admit: 2020-06-17 | Discharge: 2020-06-17 | Disposition: A | Discharge status: Home / Self Care | Payer: Medicare Other | Source: Ambulatory Visit | Attending: Internal Medicine | Admitting: Internal Medicine

## 2020-06-17 DIAGNOSIS — Z1231 Encounter for screening mammogram for malignant neoplasm of breast: Secondary | ICD-10-CM

## 2020-07-19 ENCOUNTER — Encounter: Payer: Self-pay | Admitting: Obstetrics and Gynecology

## 2020-07-19 ENCOUNTER — Telehealth: Payer: Self-pay | Admitting: Nutrition

## 2020-07-19 ENCOUNTER — Telehealth: Payer: Self-pay

## 2020-07-19 DIAGNOSIS — M8580 Other specified disorders of bone density and structure, unspecified site: Secondary | ICD-10-CM

## 2020-07-19 NOTE — Telephone Encounter (Signed)
Message left on machine on 07/15/20 that she is in need of a reclast infusion.  My phone was not available to get the messages because it was locked, until today.  She was called back and patient says that she has made arrangements for an appointment elsewhere.  I apologized and told her that I would work her in tomorrow, but she refused.

## 2020-07-19 NOTE — Telephone Encounter (Signed)
Spoke with pt. Pt states had Reclast infusion for October with Dr Cordelia Pen office and needed to be rescheduled.  Pt requesting new Endocrinology provider. Pt states would like to go to [facility] . Pt states will call Dr Cordelia Pen office and transfer records.   New referral placed. Pt aware will receive call from referral office.  Cc: Hayley for referral  Encounter closed

## 2020-09-03 DIAGNOSIS — M1990 Unspecified osteoarthritis, unspecified site: Secondary | ICD-10-CM

## 2020-09-03 HISTORY — DX: Unspecified osteoarthritis, unspecified site: M19.90

## 2020-11-04 NOTE — Progress Notes (Signed)
Oncology Nurse Navigator Documentation  Placed introductory call to new referral patient Otilia Kareem  Introduced myself as the H&N oncology nurse navigator that works with Dr. Isidore Moos to whom she has been referred by Dr. Sarajane Jews. She confirmed understanding of referral.  Briefly explained my role as his navigator, provided my contact information.   Confirmed understanding of upcoming appts and [place] location, explained arrival and registration process.  II encouraged her to call with questions/concerns as she moves forward with appts and procedures.    She verbalized understanding of information provided, expressed appreciation for my call.   Navigator Initial Assessment . Employment Status: She is retired . Currently on FMLA / STD: no . Living Situation: She lives with her husband, Gwyndolyn Saxon . Support System: husband . PCP: Pricilla Holm MD . PCD: na . Financial Concerns: no . Transportation Needs: no . Sensory Deficits: no . Language Barriers/Interpreter Needed:  no . Ambulation Needs: no . DME Used in Home: no . Psychosocial Needs:  no . Concerns/Needs Understanding Cancer:  addressed/answered by navigator to best of ability . Self-Expressed Needs: no   Harlow Asa RN, BSN, OCN Head & Neck Oncology Nurse [place] [facility] Phone # [phone]  Fax # [phone]

## 2020-11-08 NOTE — Progress Notes (Signed)
Histology and Location of Primary Skin Cancer:    Mary Malone presented with the following signs/symptoms:  From dermatologist's note on 10/17/2020   Past/Anticipated interventions by patient's surgeon/dermatologist for current problematic lesion, if any:  10/26/2020 Dr. Griselda Miner   Past skin cancers, if any:  None  History of Blistering sunburns, if any: One episode in patient's 80s (thinks it mainly affected her legs and back)  SAFETY ISSUES:  Prior radiation? No  Pacemaker/ICD? No  Possible current pregnancy? No--postmenopausal   Is the patient on methotrexate? No  Current Complaints / other details:  Patient has received both Pfizer vaccines, as well as Product/process development scientist

## 2020-11-09 ENCOUNTER — Other Ambulatory Visit: Payer: Self-pay

## 2020-11-09 ENCOUNTER — Ambulatory Visit
Admission: RE | Admit: 2020-11-09 | Discharge: 2020-11-09 | Disposition: A | Discharge status: Home / Self Care | Payer: Medicare Other | Source: Ambulatory Visit | Attending: Radiation Oncology | Admitting: Radiation Oncology

## 2020-11-09 ENCOUNTER — Encounter: Payer: Self-pay | Admitting: Radiation Oncology

## 2020-11-09 VITALS — BP 154/68 | HR 68 | Temp 97.3°F | Resp 18 | Ht 65.0 in | Wt 132.5 lb

## 2020-11-09 DIAGNOSIS — E785 Hyperlipidemia, unspecified: Secondary | ICD-10-CM | POA: Insufficient documentation

## 2020-11-09 DIAGNOSIS — C44311 Basal cell carcinoma of skin of nose: Secondary | ICD-10-CM

## 2020-11-09 DIAGNOSIS — Z7982 Long term (current) use of aspirin: Secondary | ICD-10-CM | POA: Diagnosis not present

## 2020-11-09 DIAGNOSIS — Z801 Family history of malignant neoplasm of trachea, bronchus and lung: Secondary | ICD-10-CM | POA: Insufficient documentation

## 2020-11-09 DIAGNOSIS — Z79899 Other long term (current) drug therapy: Secondary | ICD-10-CM | POA: Insufficient documentation

## 2020-11-09 DIAGNOSIS — M810 Age-related osteoporosis without current pathological fracture: Secondary | ICD-10-CM | POA: Diagnosis not present

## 2020-11-09 DIAGNOSIS — M1990 Unspecified osteoarthritis, unspecified site: Secondary | ICD-10-CM | POA: Diagnosis not present

## 2020-11-09 DIAGNOSIS — F329 Major depressive disorder, single episode, unspecified: Secondary | ICD-10-CM | POA: Insufficient documentation

## 2020-11-09 NOTE — Progress Notes (Signed)
Radiation Oncology         [phone] ________________________________  Initial Outpatient Consultation  Name: Mary Malone MRN: [id number]  Date: 11/09/2020  DOB: [birth year]  VO:[id number], Real Cons, MD  Griselda Miner, MD   REFERRING PHYSICIAN: Griselda Miner, MD  DIAGNOSIS:    ICD-10-CM   1. 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 female who presented with left-sided nasal collapse.  Subsequently, she was seen by Dr. Sarajane Jews, who performed a skin biopsy of the left superior lateral nasal nare on 10/26/2020. Results showed irregular aggregations of basaloid cells with jagged borders that were encased in a fibrotic stroma, consistent with an infiltrative pattern of basal cell carcinoma.  She has had a delay in treatment of the skin cancer because she is responsible for taking care of her husband who has dementia.  She reports that life has been overwhelming, largely because of her caregiver responsibilities.  She is debating whether to undergo surgery for her skin cancer as it would require Mohs surgery followed by reconstructive plastic surgery at [facility].  She does not know how she will delegate care for her husband if she goes through surgery.  She reports that it is difficult to breathe at night because her left nostril has partially collapsed from the cancer.  It is important to her that she restore the integrity of her left nostril, if  possible.  Right now she and her husband live in a town home.  They have considered going to a senior community but she believes her husband will be resistant to this.  She reports that she has very little social support or backup plans for his care.   Past skin cancers, if any:  None  History of Blistering sunburns, if any: One episode in patient's 30s (thinks it mainly affected her legs and back)  SAFETY ISSUES:  Prior radiation? No  Pacemaker/ICD? No  Possible current pregnancy? No--postmenopausal   Is the patient on methotrexate? No  Current Complaints / other details:  Patient has received both Pfizer vaccines, as well as [employer] booster   PREVIOUS RADIATION THERAPY: No  PAST MEDICAL HISTORY:  has a past medical history of ALLERGIC RHINITIS, Back pain, Dense breast, DEPRESSION, Fibroid, GERD, Hearing loss, HYPERLIPIDEMIA, Osteoarthritis, Osteoporosis, PMB (postmenopausal bleeding) (12/2001), Serum calcium elevated (2021), Thyroid disease, URINARY INCONTINENCE, and Wisdom teeth removed (1866/1967).    PAST SURGICAL HISTORY: Past Surgical History:  Procedure Laterality Date  . BREAST CYST ASPIRATION  12/05/2001  . CATARACT EXTRACTION Bilateral 07/2012, 09/2012  . DILATION AND CURETTAGE OF UTERUS  2003  . HYSTEROSCOPY  01/2002   resection, sm fibroid  . Left knee surgey    . MENISCUS REPAIR     left knee 2010  . REFRACTIVE SURGERY  02/2016  . WISDOM TOOTH EXTRACTION      FAMILY HISTORY: family history includes Allergies in her brother and mother; Alzheimer's disease in her mother; Alzheimer's disease (age of onset:  78) in her brother; Breast cancer in her paternal aunt; Breast cancer (age of onset: 88) in her mother; Dementia in her mother; Diabetes in her brother; Diabetes (age of onset: 46) in her brother; Lung cancer (age of onset: 75) in her father; Peripheral Artery Disease in her maternal grandmother; Stroke in her maternal grandmother; Sudden death (age of onset: 10)  in her paternal grandmother; Transient ischemic attack in her maternal grandmother; Tuberculosis in her maternal grandfather.  SOCIAL HISTORY:  reports that she has never smoked. She has never used smokeless tobacco. She reports that she does not drink alcohol and does not use drugs.  ALLERGIES: Patient has no known allergies.  MEDICATIONS:  Current Outpatient Medications  Medication Sig Dispense Refill  . aspirin 81 MG tablet Take 81 mg by mouth daily.    . Azelastine-Fluticasone 137-50 MCG/ACT SUSP 1-2 puffs each nostril twice daily as needed 23 g 12  . Calcium Citrate-Vitamin D (CITRACAL + D PO) Take 1,200 mg by mouth daily.    . Cholecalciferol (VITAMIN D3) 2000 units TABS Take 1 tablet by mouth daily.    Marland Kitchen conjugated estrogens (PREMARIN) vaginal cream USE 0.5 GRAM VAGINALLY 2 TIMES A WEEK 30 g 1  . cromolyn (NASALCROM) 5.2 MG/ACT nasal spray Place 1 spray into both nostrils once for 1 dose. Taking once in the morning 06/19 26 mL 12  . levothyroxine (SYNTHROID) 25 MCG tablet TAKE 1 TABLET(25 MCG) BY MOUTH DAILY BEFORE BREAKFAST 90 tablet 3  . montelukast (SINGULAIR) 10 MG tablet TAKE 1 TABLET(10 MG) BY MOUTH AT BEDTIME 30 tablet 12  . Multiple Vitamins-Minerals (CENTRUM SILVER) tablet Take 1 tablet by mouth daily.    . Omega-3 Fatty Acids (FISH OIL) 1200 MG CAPS Take by mouth daily.    Marland Kitchen PREVIDENT 5000 BOOSTER PLUS 1.1 % PSTE 3 (three) times daily as needed.    . pseudoephedrine (SUDAFED) 120 MG 12 hr tablet Take 120 mg by mouth once.     . vitamin E 400 UNIT capsule Take 800 Units by mouth daily.     . Zoledronic Acid (RECLAST IV) Inject into the vein. IV infusion yearly     No current facility-administered medications for this encounter.    REVIEW OF SYSTEMS:  Notable for that above.   PHYSICAL EXAM:  height is 5\' 5"  (1.651 m) and weight is 132 lb 8 oz (60.1 kg). Her temporal temperature is 97.3 F (36.3 C) (abnormal). Her blood pressure is 154/68 (abnormal) and her pulse is 68.  Her respiration is 18 and oxygen saturation is 100%.   General: Alert and oriented, in mild distress  HEENT: Left lateral nose, superior ala, shows evidence of moist desquamation at the previous site of basal cell carcinoma biopsy.  The area of desquamation is just under 2.0 cm.  There is collapse of the lateral left nasal wall. Neck: Neck is supple, no palpable cervical, facial, periauricular or supraclavicular lymphadenopathy. Psychiatric: Judgment and insight are intact. Affect is appropriate.   ECOG = 0  0 - Asymptomatic (Fully active, able to carry on all predisease activities without restriction)  1 - Symptomatic but completely ambulatory (Restricted in physically strenuous activity but ambulatory and able to carry out work of a light or sedentary nature. For example, light housework, office work)  2 - Symptomatic, <50% in bed during the day (Ambulatory and capable of all self care but unable to carry out any work activities. Up and about more than 50% of waking hours)  3 - Symptomatic, >  50% in bed, but not bedbound (Capable of only limited self-care, confined to bed or chair 50% or more of waking hours)  4 - Bedbound (Completely disabled. Cannot carry on any self-care. Totally confined to bed or chair)  5 - Death   Eustace Pen MM, Creech RH, Tormey DC, et al. [phone]). "Toxicity and response criteria of the [facility]". [place] Oncol. 5 (6): 649-55   LABORATORY DATA:  Lab Results  Component Value Date   WBC 5.4 06/18/2018   HGB 12.9 06/18/2018   HCT 37.6 06/18/2018   MCV 92.2 06/18/2018   PLT 273.0 06/18/2018   CMP     Component Value Date/Time   NA 142 04/19/2020 1350   K 4.6 04/19/2020 1350   CL 104 04/19/2020 1350   CO2 32 04/19/2020 1350   GLUCOSE 80 04/19/2020 1350   BUN 22 04/19/2020 1350   CREATININE 0.99 04/19/2020 1350   CALCIUM 10.5 (H) 05/24/2020 1402   PROT 7.5 05/24/2020 1402   PROT 7.1 05/24/2020 1402   ALBUMIN 4.5 05/24/2020 1402    AST 19 05/24/2020 1402   ALT 16 05/24/2020 1402   ALKPHOS 46 05/24/2020 1402   BILITOT 0.5 05/24/2020 1402         RADIOGRAPHY: No results found.    IMPRESSION/PLAN: Skin cancer, basal cell carcinoma, left lateral nose, nasal ala  Today, I talked to the patient about the findings and work-up thus far. We discussed the patient's diagnosis of basal cell carcinoma of the nose and general treatment for this, highlighting the role of radiotherapy in the management. We discussed the available radiation techniques, and focused on the details of logistics and delivery.  She understands that the most standard approach would be to treat over 6-weeks.  But a 4-week hypofractionated regimen could be considered if coming back and forth for 6 weeks is prohibitive.  She would have an excellent chance of cure with radiation therapy.  I did however acknowledge that radiation therapy would not be likely to restore the integrity of her nasal wall.  The collapse of her nasal wall is affecting her quality of life and affecting her breathing while she is asleep.  She very much wants to restore this.  Therefore, she would like to talk with the plastic surgeon at [facility] to see what her prospects are.  We have notified Dr. Everett Graff office.  I will also refer her to social work as she seems overwhelmed with the caregiving of her husband who has dementia.  She does not have much social support and his needs have caused her to neglect some of her own personal needs.  She is enthusiastic about talking to a Education officer, museum and hopefully one of them can get in touch with her soon.  I wished her the best.  I will see her back on an as-needed basis moving forward.  On date of service, in total, I spent 45 minutes on this encounter. Patient was seen in person.  __________________________________________   Eppie Gibson, MD  This document serves as a record of services personally performed by Eppie Gibson, MD. It  was created on his behalf by Clerance Lav, a trained medical scribe. The creation of this record is based on the scribe's personal observations and the provider's statements to them. This document has been checked and approved by the attending provider.

## 2020-11-09 NOTE — Progress Notes (Signed)
Oncology Nurse Navigator Documentation  Met with patient during initial consult with Dr. Isidore Moos.  . Further introduced myself as her Navigator, explained my role as a member of the Care Team. . Assisted with post-consult appt scheduling. I will contact Dr. Sarajane Jews of [facility] to schedule an appointment with their office to see her.  . I have placed a referral from Dr. Isidore Moos for Mary Malone to be contacted by Social Work.  . She verbalized understanding of information provided. . I encouraged her to call with questions/concerns moving forward.  Harlow Asa, RN, BSN, OCN Head & Neck Oncology Nurse [place] at [place] [phone]

## 2020-11-10 ENCOUNTER — Encounter: Payer: Self-pay | Admitting: General Practice

## 2020-11-10 NOTE — Progress Notes (Signed)
[place] Psychosocial Distress Screening Clinical Social Work  Clinical Social Work was referred by distress screening protocol.  The patient scored a 6 on the Psychosocial Distress Thermometer which indicates moderate distress. Clinical Social Worker contacted patient by phone to assess for distress and other psychosocial needs. Spoke w patient, she is most concerned about finding care for her husband who has dementia and is dependent on her.  She has no family or local friends available to assist.  She will need help with him while she undergoes treatment and possibly is referred for surgery in [place].  She states he is not amenable to being in any kind of program, nor does she have any help caring for him at home.  Over the last few months, he has become more dependent on her and does not want to consider any kind of day program or similar.  She is able to pay privately for a sitter, will refer her to Arnell Asal, [employer] for resources.  She is also concerned about transportation to [facility] as she will be unable to drive post surgery.  Advised that she needs to discuss this with [facility] provider as they may have resources to assist.  She does not need help w transport to/from [facility] as she drives herself.  CSW will follow up next week to determine progress.    ONCBCN DISTRESS SCREENING 11/09/2020  Screening Type Initial Screening  Distress experienced in past week (1-10) 6  Family Problem type Partner  Emotional problem type Nervousness/Anxiety  Information Concerns Type Lack of info about treatment  Physical Problem type Sleep/insomnia;Breathing;Tingling hands/feet;Skin dry/itchy  Physician notified of physical symptoms Yes  Referral to clinical psychology No  Referral to clinical social work Yes  Referral to dietition No  Referral to financial advocate No  Referral to support programs No  Referral to palliative care No    Clinical Social Worker follow up  needed: Yes.    If yes, follow up plan:   Call next week to assess progress  Beverely Pace, [place], [place] Worker Phone:  [phone]

## 2020-11-17 ENCOUNTER — Inpatient Hospital Stay: Payer: Medicare Other | Attending: Genetic Counselor | Admitting: General Practice

## 2020-11-17 ENCOUNTER — Other Ambulatory Visit: Payer: Self-pay

## 2020-11-17 DIAGNOSIS — C44311 Basal cell carcinoma of skin of nose: Secondary | ICD-10-CM

## 2020-11-17 NOTE — Progress Notes (Signed)
[facility] CSW Progress Notes  Met with patient in Support Center.  She is struggling with dealing with her own care needs in the context of her husband's advancing early stage Alzheimers.  At this point, she is caring for him at home by herself.  He is resistant to any kind of congregate care setting.  She needs surgery at [place] for her own cancer - she states that husband will insist on accompanying her to the surgery.  As a result, she will need to find a caregiver to sit with him while she is in surgery.  As she cannot drive post surgery, she plans to stay in a hotel in [place] the night after surgery so she can drive herself/husband to/from [place].  She has no family/friends to ask for support ("all my friends have moved away or are too sick to help", she has no children nor any younger relatives who might be willing to assist).    Talked about the complexities of care for self and husband in the context of both of their health challenges and the need to develop a wider "team" she can rely on.  Recommended [facility] [place] ([phone]) and a local geriatric care manager ([facility] is one possibility [phone]) as ways to connect with a wider source of information and resources.  She has been in contact w N Reynolds, [employer] Navigator w [facility], who has provided her with lists of personal care attendants/sitters who may be able to assist w husbands care needs while she is in surgery.  She also requests help w getting information on referral made last week to dermatologist for upcoming surgery - she has not heard from this office. J Malmfelt, nurse navigator, requested to follow up w patient on this.   , LCSW Clinical Social Worker Phone:  [phone]  

## 2020-11-30 ENCOUNTER — Other Ambulatory Visit: Payer: Self-pay

## 2020-11-30 ENCOUNTER — Ambulatory Visit
Admission: RE | Admit: 2020-11-30 | Discharge: 2020-11-30 | Disposition: A | Discharge status: Home / Self Care | Payer: Medicare Other | Source: Ambulatory Visit | Attending: Radiation Oncology | Admitting: Radiation Oncology

## 2020-11-30 DIAGNOSIS — C44311 Basal cell carcinoma of skin of nose: Secondary | ICD-10-CM | POA: Diagnosis not present

## 2020-11-30 NOTE — Progress Notes (Signed)
Oncology Nurse Navigator Documentation  I met briefly with Mary Malone during her CT sim/electron set up today. She was anxious about the planning process but felt better after everything was completed and she knew what to expect. She denies any other concerns at this time. She is aware that she can call me with any questions or concerns before her radiation start date.   Harlow Asa RN, BSN, OCN Head & Neck Oncology Nurse [place] at [facility] Phone # [phone]  Fax # [phone]

## 2020-12-01 ENCOUNTER — Encounter: Payer: Self-pay | Admitting: General Practice

## 2020-12-01 NOTE — Progress Notes (Signed)
[place] Spiritual Care Note  Reached Mary Malone by phone per referral from [facility] for support re treatment and caregiver distress. Mary Malone was very welcoming of call, while also feeling exhausted from yesterday. She has my direct-dial number and plans to reach out as needed/desired, reporting some relief at knowing that a supportive listener is available.   [place], [place], [facility] Pager [phone] Voicemail [phone]

## 2020-12-02 DIAGNOSIS — C801 Malignant (primary) neoplasm, unspecified: Secondary | ICD-10-CM

## 2020-12-02 DIAGNOSIS — Z923 Personal history of irradiation: Secondary | ICD-10-CM

## 2020-12-02 HISTORY — DX: Personal history of irradiation: Z92.3

## 2020-12-02 HISTORY — DX: Malignant (primary) neoplasm, unspecified: C80.1

## 2020-12-05 ENCOUNTER — Other Ambulatory Visit: Payer: Self-pay

## 2020-12-05 ENCOUNTER — Ambulatory Visit
Admission: RE | Admit: 2020-12-05 | Discharge: 2020-12-05 | Disposition: A | Discharge status: Home / Self Care | Payer: Medicare Other | Source: Ambulatory Visit | Attending: Radiation Oncology | Admitting: Radiation Oncology

## 2020-12-05 DIAGNOSIS — C44311 Basal cell carcinoma of skin of nose: Secondary | ICD-10-CM

## 2020-12-05 MED ORDER — SONAFINE EX EMUL
1.0000 "application " | Freq: Two times a day (BID) | CUTANEOUS | Status: DC
  Administered 2020-12-05: 1 via TOPICAL

## 2020-12-05 NOTE — Progress Notes (Signed)
Pt here for patient teaching.    Pt given Radiation and You booklet, Managing Acute Radiation Side Effects for Head and Neck Cancer handout, skin care instructions and Sonafine.    Reviewed areas of pertinence such as fatigue, mouth changes, skin changes and taste changes .   Pt able to give teach back of to pat skin, use unscented/gentle soap and drink plenty of water,apply Sonafine bid and avoid applying anything to skin within 4 hours of treatment.   Pt demonstrated understanding and verbalizes understanding of information given and will contact nursing with any questions or concerns.    Http://rtanswers.org/treatmentinformation/whattoexpect/index

## 2020-12-06 ENCOUNTER — Ambulatory Visit
Admission: RE | Admit: 2020-12-06 | Discharge: 2020-12-06 | Disposition: A | Discharge status: Home / Self Care | Payer: Medicare Other | Source: Ambulatory Visit | Attending: Radiation Oncology | Admitting: Radiation Oncology

## 2020-12-06 DIAGNOSIS — C44311 Basal cell carcinoma of skin of nose: Secondary | ICD-10-CM | POA: Diagnosis not present

## 2020-12-07 ENCOUNTER — Other Ambulatory Visit: Payer: Self-pay

## 2020-12-07 ENCOUNTER — Ambulatory Visit
Admission: RE | Admit: 2020-12-07 | Discharge: 2020-12-07 | Disposition: A | Discharge status: Home / Self Care | Payer: Medicare Other | Source: Ambulatory Visit | Attending: Radiation Oncology | Admitting: Radiation Oncology

## 2020-12-07 DIAGNOSIS — C44311 Basal cell carcinoma of skin of nose: Secondary | ICD-10-CM | POA: Diagnosis not present

## 2020-12-08 ENCOUNTER — Ambulatory Visit
Admission: RE | Admit: 2020-12-08 | Discharge: 2020-12-08 | Disposition: A | Discharge status: Home / Self Care | Payer: Medicare Other | Source: Ambulatory Visit | Attending: Radiation Oncology | Admitting: Radiation Oncology

## 2020-12-08 ENCOUNTER — Other Ambulatory Visit: Payer: Self-pay

## 2020-12-08 DIAGNOSIS — C44311 Basal cell carcinoma of skin of nose: Secondary | ICD-10-CM | POA: Diagnosis not present

## 2020-12-09 ENCOUNTER — Ambulatory Visit
Admission: RE | Admit: 2020-12-09 | Discharge: 2020-12-09 | Disposition: A | Discharge status: Home / Self Care | Payer: Medicare Other | Source: Ambulatory Visit | Attending: Radiation Oncology | Admitting: Radiation Oncology

## 2020-12-09 DIAGNOSIS — C44311 Basal cell carcinoma of skin of nose: Secondary | ICD-10-CM | POA: Diagnosis not present

## 2020-12-12 ENCOUNTER — Ambulatory Visit
Admission: RE | Admit: 2020-12-12 | Discharge: 2020-12-12 | Disposition: A | Discharge status: Home / Self Care | Payer: Medicare Other | Source: Ambulatory Visit | Attending: Radiation Oncology | Admitting: Radiation Oncology

## 2020-12-12 ENCOUNTER — Other Ambulatory Visit: Payer: Self-pay

## 2020-12-12 DIAGNOSIS — C44311 Basal cell carcinoma of skin of nose: Secondary | ICD-10-CM | POA: Diagnosis not present

## 2020-12-13 ENCOUNTER — Ambulatory Visit
Admission: RE | Admit: 2020-12-13 | Discharge: 2020-12-13 | Disposition: A | Discharge status: Home / Self Care | Payer: Medicare Other | Source: Ambulatory Visit | Attending: Radiation Oncology | Admitting: Radiation Oncology

## 2020-12-13 DIAGNOSIS — C44311 Basal cell carcinoma of skin of nose: Secondary | ICD-10-CM | POA: Diagnosis not present

## 2020-12-14 ENCOUNTER — Ambulatory Visit
Admission: RE | Admit: 2020-12-14 | Discharge: 2020-12-14 | Disposition: A | Discharge status: Home / Self Care | Payer: Medicare Other | Source: Ambulatory Visit | Attending: Radiation Oncology | Admitting: Radiation Oncology

## 2020-12-14 ENCOUNTER — Other Ambulatory Visit: Payer: Self-pay

## 2020-12-14 DIAGNOSIS — C44311 Basal cell carcinoma of skin of nose: Secondary | ICD-10-CM | POA: Diagnosis not present

## 2020-12-15 ENCOUNTER — Ambulatory Visit
Admission: RE | Admit: 2020-12-15 | Discharge: 2020-12-15 | Disposition: A | Discharge status: Home / Self Care | Payer: Medicare Other | Source: Ambulatory Visit | Attending: Radiation Oncology | Admitting: Radiation Oncology

## 2020-12-15 ENCOUNTER — Other Ambulatory Visit: Payer: Self-pay

## 2020-12-15 DIAGNOSIS — C44311 Basal cell carcinoma of skin of nose: Secondary | ICD-10-CM | POA: Diagnosis not present

## 2020-12-16 ENCOUNTER — Ambulatory Visit
Admission: RE | Admit: 2020-12-16 | Discharge: 2020-12-16 | Disposition: A | Discharge status: Home / Self Care | Payer: Medicare Other | Source: Ambulatory Visit | Attending: Radiation Oncology | Admitting: Radiation Oncology

## 2020-12-16 DIAGNOSIS — C44311 Basal cell carcinoma of skin of nose: Secondary | ICD-10-CM | POA: Diagnosis not present

## 2020-12-19 ENCOUNTER — Ambulatory Visit
Admission: RE | Admit: 2020-12-19 | Discharge: 2020-12-19 | Disposition: A | Discharge status: Home / Self Care | Payer: Medicare Other | Source: Ambulatory Visit | Attending: Radiation Oncology | Admitting: Radiation Oncology

## 2020-12-19 ENCOUNTER — Other Ambulatory Visit: Payer: Self-pay

## 2020-12-19 DIAGNOSIS — C44311 Basal cell carcinoma of skin of nose: Secondary | ICD-10-CM | POA: Diagnosis not present

## 2020-12-20 ENCOUNTER — Ambulatory Visit
Admission: RE | Admit: 2020-12-20 | Discharge: 2020-12-20 | Disposition: A | Discharge status: Home / Self Care | Payer: Medicare Other | Source: Ambulatory Visit | Attending: Radiation Oncology | Admitting: Radiation Oncology

## 2020-12-20 DIAGNOSIS — C44311 Basal cell carcinoma of skin of nose: Secondary | ICD-10-CM | POA: Diagnosis not present

## 2020-12-21 ENCOUNTER — Other Ambulatory Visit: Payer: Self-pay

## 2020-12-21 ENCOUNTER — Ambulatory Visit
Admission: RE | Admit: 2020-12-21 | Discharge: 2020-12-21 | Disposition: A | Discharge status: Home / Self Care | Payer: Medicare Other | Source: Ambulatory Visit | Attending: Radiation Oncology | Admitting: Radiation Oncology

## 2020-12-21 DIAGNOSIS — C44311 Basal cell carcinoma of skin of nose: Secondary | ICD-10-CM | POA: Diagnosis not present

## 2020-12-22 ENCOUNTER — Ambulatory Visit
Admission: RE | Admit: 2020-12-22 | Discharge: 2020-12-22 | Disposition: A | Discharge status: Home / Self Care | Payer: Medicare Other | Source: Ambulatory Visit | Attending: Radiation Oncology | Admitting: Radiation Oncology

## 2020-12-22 ENCOUNTER — Encounter: Payer: Self-pay | Admitting: General Practice

## 2020-12-22 DIAGNOSIS — C44311 Basal cell carcinoma of skin of nose: Secondary | ICD-10-CM | POA: Diagnosis not present

## 2020-12-22 NOTE — Progress Notes (Signed)
  [place] CSW Progress Notes  Request received from Purvis Kilts RN to contact patient about any resources for house cleaning help.  She is fatigued due to cancer treatment.  Called patient, she  Has already made an appt with a cleaning service for an estimate.  She will let us know if she wants to pursue a referral to [employer] (charity that provides two complimentary house cleaning sessions to patients in treatment for cancer, availability is limited based on local providers resources to assist).  Edwyna Shell, LCSW Clinical Social Worker Phone:  [phone]

## 2020-12-23 ENCOUNTER — Ambulatory Visit
Admission: RE | Admit: 2020-12-23 | Discharge: 2020-12-23 | Disposition: A | Discharge status: Home / Self Care | Payer: Medicare Other | Source: Ambulatory Visit | Attending: Radiation Oncology | Admitting: Radiation Oncology

## 2020-12-23 DIAGNOSIS — C44311 Basal cell carcinoma of skin of nose: Secondary | ICD-10-CM | POA: Diagnosis not present

## 2020-12-26 ENCOUNTER — Ambulatory Visit
Admission: RE | Admit: 2020-12-26 | Discharge: 2020-12-26 | Disposition: A | Discharge status: Home / Self Care | Payer: Medicare Other | Source: Ambulatory Visit | Attending: Radiation Oncology | Admitting: Radiation Oncology

## 2020-12-26 DIAGNOSIS — C44311 Basal cell carcinoma of skin of nose: Secondary | ICD-10-CM | POA: Diagnosis not present

## 2020-12-27 ENCOUNTER — Ambulatory Visit
Admission: RE | Admit: 2020-12-27 | Discharge: 2020-12-27 | Disposition: A | Discharge status: Home / Self Care | Payer: Medicare Other | Source: Ambulatory Visit | Attending: Radiation Oncology | Admitting: Radiation Oncology

## 2020-12-27 DIAGNOSIS — C44311 Basal cell carcinoma of skin of nose: Secondary | ICD-10-CM | POA: Diagnosis not present

## 2020-12-28 ENCOUNTER — Ambulatory Visit
Admission: RE | Admit: 2020-12-28 | Discharge: 2020-12-28 | Disposition: A | Discharge status: Home / Self Care | Payer: Medicare Other | Source: Ambulatory Visit | Attending: Radiation Oncology | Admitting: Radiation Oncology

## 2020-12-28 DIAGNOSIS — C44311 Basal cell carcinoma of skin of nose: Secondary | ICD-10-CM | POA: Diagnosis not present

## 2020-12-29 ENCOUNTER — Ambulatory Visit
Admission: RE | Admit: 2020-12-29 | Discharge: 2020-12-29 | Disposition: A | Discharge status: Home / Self Care | Payer: Medicare Other | Source: Ambulatory Visit | Attending: Radiation Oncology | Admitting: Radiation Oncology

## 2020-12-29 ENCOUNTER — Other Ambulatory Visit: Payer: Self-pay

## 2020-12-29 DIAGNOSIS — C44311 Basal cell carcinoma of skin of nose: Secondary | ICD-10-CM | POA: Diagnosis not present

## 2020-12-29 NOTE — Progress Notes (Signed)
Oncology Nurse Navigator Documentation  Met with Mary Malone one day before her final RT to offer support and to celebrate end of radiation treatment.   Provided verbal/written post-RT guidance:  Importance of keeping her one month follow up appointment with Dr. Isidore Moos.  Importance of protecting treatment area from sun.  Continuation of Sonafine application 2-3 times daily, application of antibiotic ointment to areas of raw skin; when supply of Sonafine exhausted transition to OTC lotion with vitamin E. Explained my role as navigator will continue for several more months, encouraged her to call me with needs/concerns.    Harlow Asa RN, BSN, OCN Head & Neck Oncology Nurse [place] at [facility] Phone # [phone]  Fax # [phone]

## 2020-12-30 ENCOUNTER — Ambulatory Visit
Admission: RE | Admit: 2020-12-30 | Discharge: 2020-12-30 | Disposition: A | Discharge status: Home / Self Care | Payer: Medicare Other | Source: Ambulatory Visit | Attending: Radiation Oncology | Admitting: Radiation Oncology

## 2020-12-30 ENCOUNTER — Encounter: Payer: Self-pay | Admitting: Radiation Oncology

## 2020-12-30 ENCOUNTER — Other Ambulatory Visit: Payer: Self-pay | Admitting: Internal Medicine

## 2020-12-30 DIAGNOSIS — C44311 Basal cell carcinoma of skin of nose: Secondary | ICD-10-CM | POA: Diagnosis not present

## 2020-12-30 DIAGNOSIS — M810 Age-related osteoporosis without current pathological fracture: Secondary | ICD-10-CM

## 2021-01-13 ENCOUNTER — Telehealth: Payer: Self-pay

## 2021-01-13 NOTE — Telephone Encounter (Signed)
Received VM from patient concerned that she was having yellow-tinged drainage from the skin on her nose that received radiation. She wanted to make sure it wasn't a sign of infection, and requested return call.   Called patient back on number she provided, but unable to speak to patient directly. Left VM stating that yellow drainage is a normal and expected symptom as her skin heals from radiation treatment. Advised her to monitor for fever, changes in drainage color (becoming green or putrid), and other signs of infection like the residing area appearing red/hot to the touch. Informed her that now that she is 2 weeks out from completing radiation, she should hopefully turn a corner and start seeing an improvement. If she still has concerns next week, I provided my direct call back number for her to call and see about moving F/U appointment sooner or at least talk through concerns.

## 2021-01-17 ENCOUNTER — Other Ambulatory Visit: Payer: Self-pay | Admitting: Internal Medicine

## 2021-01-17 ENCOUNTER — Ambulatory Visit
Admission: RE | Admit: 2021-01-17 | Discharge: 2021-01-17 | Disposition: A | Discharge status: Home / Self Care | Payer: Medicare Other | Source: Ambulatory Visit | Attending: Internal Medicine | Admitting: Internal Medicine

## 2021-01-17 DIAGNOSIS — M8000XA Age-related osteoporosis with current pathological fracture, unspecified site, initial encounter for fracture: Secondary | ICD-10-CM

## 2021-01-23 ENCOUNTER — Other Ambulatory Visit (HOSPITAL_COMMUNITY): Payer: Self-pay

## 2021-01-24 ENCOUNTER — Ambulatory Visit (HOSPITAL_COMMUNITY)
Admission: RE | Admit: 2021-01-24 | Discharge: 2021-01-24 | Disposition: A | Discharge status: Home / Self Care | Payer: Medicare Other | Source: Ambulatory Visit | Attending: Internal Medicine | Admitting: Internal Medicine

## 2021-01-24 ENCOUNTER — Other Ambulatory Visit: Payer: Self-pay

## 2021-01-24 DIAGNOSIS — M810 Age-related osteoporosis without current pathological fracture: Secondary | ICD-10-CM | POA: Insufficient documentation

## 2021-01-24 MED ORDER — ZOLEDRONIC ACID 5 MG/100ML IV SOLN
INTRAVENOUS | Status: AC
  Administered 2021-01-24: 5 mg
  Filled 2021-01-24: qty 100

## 2021-01-24 MED ORDER — ZOLEDRONIC ACID 5 MG/100ML IV SOLN: 5.0000 mg | Freq: Once | INTRAVENOUS | Status: DC

## 2021-02-01 ENCOUNTER — Ambulatory Visit
Admission: RE | Admit: 2021-02-01 | Discharge: 2021-02-01 | Disposition: A | Discharge status: Home / Self Care | Payer: Medicare Other | Source: Ambulatory Visit | Attending: Radiation Oncology | Admitting: Radiation Oncology

## 2021-02-01 ENCOUNTER — Other Ambulatory Visit: Payer: Self-pay

## 2021-02-01 VITALS — BP 140/65 | HR 65 | Temp 97.4°F | Resp 18 | Ht 65.0 in | Wt 134.0 lb

## 2021-02-01 DIAGNOSIS — M47812 Spondylosis without myelopathy or radiculopathy, cervical region: Secondary | ICD-10-CM | POA: Diagnosis not present

## 2021-02-01 DIAGNOSIS — R200 Anesthesia of skin: Secondary | ICD-10-CM | POA: Diagnosis not present

## 2021-02-01 DIAGNOSIS — Z79899 Other long term (current) drug therapy: Secondary | ICD-10-CM | POA: Diagnosis not present

## 2021-02-01 DIAGNOSIS — Z923 Personal history of irradiation: Secondary | ICD-10-CM | POA: Insufficient documentation

## 2021-02-01 DIAGNOSIS — C44311 Basal cell carcinoma of skin of nose: Secondary | ICD-10-CM

## 2021-02-01 NOTE — Progress Notes (Addendum)
Mary Malone presents today for follow-up after completing radiation to the left side of her nose/ala on 12/30/2020  Pain issues, if any: Denies any pain, but does report residual numbness to the left side of her nose Weight changes, if any:  Wt Readings from Last 3 Encounters:  02/01/21 134 lb (60.8 kg)  11/09/20 132 lb 8 oz (60.1 kg)  06/08/20 129 lb (58.5 kg)   Last dermatology visit was on: Not since referral Other notable issues, if any: Skin has healed well in treatment field. Appears intact and mildly pink. Reports she has a scab that forms daily on the inside of her left nostril. States it no longer bleeds when it peels off. Continues to deal with nasal drainage. Had recent cervical spine imaging that showed some degeneration. Was told a referral was being placed to a neurosurgeon, but she states she's not interested in surgical management at this time  Vitals:   02/01/21 1105  BP: 140/65  Pulse: 65  Resp: 18  Temp: (!) 97.4 F (36.3 C)  SpO2: 98%

## 2021-02-02 NOTE — Progress Notes (Signed)
Oncology Nurse Navigator Documentation  At Dr. Pearlie Oyster request I have called [facility] and scheduled [name] for an appointment in 2 months to follow up after completion of her recent radiation treatment to her nose. I called Ms. Diego to inform her of the appointment that was scheduled on 8/2 at 10:10 and she voiced her appreciation. She knows to call me if she has any future questions or concerns.  Harlow Asa RN, BSN, OCN Head & Neck Oncology Nurse [place] at [facility] Phone # [phone]  Fax # [phone]

## 2021-02-03 ENCOUNTER — Other Ambulatory Visit: Payer: Self-pay

## 2021-02-03 ENCOUNTER — Encounter: Payer: Self-pay | Admitting: Internal Medicine

## 2021-02-03 ENCOUNTER — Ambulatory Visit (INDEPENDENT_AMBULATORY_CARE_PROVIDER_SITE_OTHER): Payer: Medicare Other | Admitting: Internal Medicine

## 2021-02-03 VITALS — BP 122/78 | HR 65 | Temp 98.4°F | Resp 18 | Ht 65.0 in | Wt 133.6 lb

## 2021-02-03 DIAGNOSIS — C44311 Basal cell carcinoma of skin of nose: Secondary | ICD-10-CM

## 2021-02-03 DIAGNOSIS — M810 Age-related osteoporosis without current pathological fracture: Secondary | ICD-10-CM | POA: Diagnosis not present

## 2021-02-03 DIAGNOSIS — M5412 Radiculopathy, cervical region: Secondary | ICD-10-CM | POA: Insufficient documentation

## 2021-02-03 DIAGNOSIS — Z0000 Encounter for general adult medical examination without abnormal findings: Secondary | ICD-10-CM | POA: Diagnosis not present

## 2021-02-03 NOTE — Assessment & Plan Note (Signed)
Appropriately worked up with recent cervical x-ray showing C5-C6 area. Refer to neurosurgery for options and PT to see if this helps with improvement. Taking otc rarely when needed. Mostly the loss of sensation is limiting due to caretaking of her husband.

## 2021-02-03 NOTE — Assessment & Plan Note (Signed)
Flu shot yearly. Covid-19 up to date. Pneumonia complete. Shingrix advised to get at pharmacy. Tetanus due 2024. Colonoscopy due 2025. Mammogram due 2023, pap smear aged out and dexa with endo up to date. Counseled about sun safety and mole surveillance. Counseled about the dangers of distracted driving. Given 10 year screening recommendations.

## 2021-02-03 NOTE — Patient Instructions (Signed)
We will get you in for physical therapy for the neck as well as to a neurosurgery specialist (spine specialist) to help guide [place] for the future with the neck.

## 2021-02-03 NOTE — Assessment & Plan Note (Signed)
Reclast yearly through endo.

## 2021-02-03 NOTE — Assessment & Plan Note (Signed)
Appropriate workup with endo.

## 2021-02-03 NOTE — Assessment & Plan Note (Signed)
Recently finished radiation for this and still undergoing follow up. Nose is still raw and is not healed and breathing is still impacted at times.

## 2021-02-03 NOTE — Progress Notes (Signed)
   Subjective:   Patient ID: Mary Malone, female    DOB: November 30, 1945, 75 y.o.   MRN: [id number]  HPI The patient is a 75 YO female coming in for neck pain and tingling/decreased sensation in the fingertips. Going on for short time and overall stable, has not tried anything for this.  Also requests physical. PMH, [place], social history reviewed and updated  Review of Systems  Constitutional: Negative.   HENT: Negative.   Eyes: Negative.   Respiratory: Negative for cough, chest tightness and shortness of breath.   Cardiovascular: Negative for chest pain, palpitations and leg swelling.  Gastrointestinal: Negative for abdominal distention, abdominal pain, constipation, diarrhea, nausea and vomiting.  Musculoskeletal: Positive for neck pain and neck stiffness.  Skin: Negative.   Neurological: Positive for numbness.  Psychiatric/Behavioral: Negative.     Objective:  Physical Exam Constitutional:      Appearance: She is well-developed.  HENT:     Head: Normocephalic and atraumatic.  Cardiovascular:     Rate and Rhythm: Normal rate and regular rhythm.  Pulmonary:     Effort: Pulmonary effort is normal. No respiratory distress.     Breath sounds: Normal breath sounds. No wheezing or rales.  Abdominal:     General: Bowel sounds are normal. There is no distension.     Palpations: Abdomen is soft.     Tenderness: There is no abdominal tenderness. There is no rebound.  Musculoskeletal:     Cervical back: Normal range of motion.  Skin:    General: Skin is warm and dry.  Neurological:     Mental Status: She is alert and oriented to person, place, and time.     Coordination: Coordination normal.     Comments: Decreased sensation fingertips     Vitals:   02/03/21 1420  BP: 122/78  Pulse: 65  Resp: 18  Temp: 98.4 F (36.9 C)  TempSrc: Oral  SpO2: 97%  Weight: 133 lb 9.6 oz (60.6 kg)  Height: 5\' 5"  (1.651 m)    This visit occurred during the SARS-CoV-2 public health emergency.   Safety protocols were in place, including screening questions prior to the visit, additional usage of staff PPE, and extensive cleaning of exam room while observing appropriate contact time as indicated for disinfecting solutions.   Assessment & Plan:

## 2021-02-06 ENCOUNTER — Encounter: Payer: Self-pay | Admitting: Radiation Oncology

## 2021-02-06 NOTE — Progress Notes (Signed)
Radiation Oncology         [phone] ________________________________  Name: Mary Malone MRN: [id number]  Date: 02/01/2021  DOB: 1945-12-23  Follow-Up Visit Note  outpatient  CC: Hoyt Koch, MD  Griselda Miner, MD  Diagnosis and Prior Radiotherapy:    ICD-10-CM   1. Basal cell carcinoma (BCC) of lateral side wall of nose  C44.311     CHIEF COMPLAINT: Here for follow-up and surveillance of nose cancer  Narrative:  The patient returns today for routine follow-up.  She is doing well.  Her breathing at night through her nostril has improved a little bit.  She does have some residual numbness to the left side of her nose.  She also reports some increased mucosal discharge from the left nostril.     skin has healed well overall.  She still has a scab that forms daily in her left nostril and she tends to peel it off daily                       ALLERGIES:  has No Known Allergies.  Meds: Current Outpatient Medications  Medication Sig Dispense Refill  . Azelastine-Fluticasone 137-50 MCG/ACT SUSP 1-2 puffs each nostril twice daily as needed 23 g 12  . Calcium Citrate-Vitamin D (CITRACAL + D PO) Take 1,200 mg by mouth daily.    . Cholecalciferol (VITAMIN D3) 2000 units TABS Take 1 tablet by mouth daily.    Marland Kitchen conjugated estrogens (PREMARIN) vaginal cream USE 0.5 GRAM VAGINALLY 2 TIMES A WEEK 30 g 1  . cromolyn (NASALCROM) 5.2 MG/ACT nasal spray Place 1 spray into both nostrils once for 1 dose. Taking once in the morning 06/19 26 mL 12  . famotidine (PEPCID) 10 MG tablet Take 10 mg by mouth daily.    Marland Kitchen levothyroxine (SYNTHROID) 25 MCG tablet TAKE 1 TABLET(25 MCG) BY MOUTH DAILY BEFORE BREAKFAST 90 tablet 3  . Magnesium 250 MG TABS Take 250 mg by mouth daily.    . montelukast (SINGULAIR) 10 MG tablet TAKE 1 TABLET(10 MG) BY MOUTH AT BEDTIME 30 tablet 12  . Multiple Vitamins-Minerals (CENTRUM SILVER) tablet Take 1 tablet by mouth daily.    . Omega-3 Fatty Acids (FISH OIL) 1200  MG CAPS Take by mouth daily.    Marland Kitchen PREVIDENT 5000 BOOSTER PLUS 1.1 % PSTE 3 (three) times daily as needed.    . vitamin E 400 UNIT capsule Take 800 Units by mouth daily.     . Zoledronic Acid (RECLAST IV) Inject into the vein. IV infusion yearly     No current facility-administered medications for this encounter.    Physical Findings: The patient is in no acute distress. Patient is alert and oriented.  height is 5\' 5"  (1.651 m) and weight is 134 lb (60.8 kg). Her temporal temperature is 97.4 F (36.3 C) (abnormal). Her blood pressure is 140/65 and her pulse is 65. Her respiration is 18 and oxygen saturation is 98%. .    There is some mild crusting of the skin over the left lateral nose where the radiation field targeted her cancer.  She is sniffling a bit.  Lab Findings: Lab Results  Component Value Date   WBC 5.4 06/18/2018   HGB 12.9 06/18/2018   HCT 37.6 06/18/2018   MCV 92.2 06/18/2018   PLT 273.0 06/18/2018    Radiographic Findings: DG Cervical Spine Complete  Result Date: 01/19/2021 CLINICAL DATA:  Osteoporosis.  Neck pain EXAM: CERVICAL SPINE - COMPLETE  4+ VIEW COMPARISON:  None. FINDINGS: Normal alignment. No fracture or mass. Prevertebral soft tissues normal. Mild disc and facet degeneration throughout the cervical spine. Disc degeneration spurring most prominent C5-6. Moderate right foraminal narrowing at C5-6. Mild right foraminal narrowing C3-4 and C4-5. Mild left foraminal narrowing C3-4, C4-5. Moderate left foraminal narrowing C5-6. IMPRESSION: Cervical spondylosis with foraminal narrowing bilaterally most prominent at C5-6 bilaterally. Electronically Signed   By: Franchot Gallo M.D.   On: 01/19/2021 14:30    Impression/Plan:   She is healing well from radiation therapy.  I expect the crusting will eventually flake off.  I recommend that she see Dr. Sarajane Jews of dermatology for follow-up in 2 months and continue to follow with him for regular skin exams as recommended.  Our  navigator will reach out to his office to see if they can establish her back in his care.  I will see her back as needed.  She is pleased with this plan.  She knows not to continue to remove the scab from inside her nostril as this will prevent it from ultimately healing.  Apply Vaseline as necessary to soothe irritated mucosa in the nostril.  Continue applying moisturizer to skin in treatment field.  On date of service, in total, I spent 25 minutes on this encounter. Patient was seen in person.  _____________________________________   Eppie Gibson, MD

## 2021-02-15 NOTE — Progress Notes (Signed)
HPI female never smoker (widow-husband died of lung cancer) followed for allergic rhinitis, complicated by GERD IgE total 02/20/16- 7 EOS 0.2 Allergy Profile 05/12/15- negative with minor elevation only for cat dander, total IgE 12 ---------------------------------------------------------------------------------------------------  60/`4/66- 75 year old female never smoker (widow-husband died of lung cancer) followed for lung nodule, allergic rhinitis, complicated by GERD, hearing loss CT in 2018 was negative. Nasalcrom, Singulair,  Had 2 Phizer Covax Doing very well with no significant exacerbation. Got through Spring pollen ok. Mask and social distancing have helped. Would like to try off singulair. Still has dymista and nasalcrom nasal sprays when needed. Rarley needs sudafed.  Will update imaging for this former smoker with past hx lung nodules.   02/16/21- 75 year old female never smoker (widow-husband died of lung cancer) followed for lung nodule, allergic rhinitis, complicated by GERD, hearing loss CT in 2018 was negative. Nasalcrom, Singulair, Astelin-fluticasone Covid vax- 2 Phizer ------No current respiratory concerns Breathing ok- no chest exacerbation. Nose - +healing slowly , still irritated after XRT for basal cell.  .Discussed role of nasal sprays. Social stress- husband Rush Landmark has Alzheimer's.  She chooses to defer CXR this year( hx second hand smoke 1st husband) CXR 02/18/20- IMPRESSION: 1. Stable exam, no acute process.  ROS-see HPI  + = positive Constitutional:   No-   weight loss, night sweats, fevers, chills, fatigue, lassitude. HEENT:   No-  headaches, difficulty swallowing, tooth/dental problems, sore throat,        sneezing, itching, ear ache, nasal congestion, +post nasal drip,  CV:  No-   chest pain, orthopnea, PND, swelling in lower extremities, anasarca,  dizziness, palpitations Resp: No-   shortness of breath with exertion or at rest.              No-    productive cough,   non-productive cough,  No- coughing up of blood.              No-   change in color of mucus.  No- wheezing.   Skin: No-   rash or lesions. GI:    heartburn, indigestion,no- abdominal pain, nausea, vomiting,  GU:  MS:  No-   joint pain or swelling.   Neuro-     nothing unusual Psych:  No- change in mood or affect. No depression or anxiety.  No memory loss.  OBJ- Physical Exam General- Alert, Oriented, Affect-appropriate, Distress- none acute, trim Skin- rash-none, lesions- none, excoriation- none Lymphadenopathy- none Head- atraumatic            Eyes- Gross vision intact, PERRLA, conjunctivae and secretions clear            Ears- no problem with face-to-face conversation in quiet exam room. +R tube.             Nose- clear, no-Septal dev, mucus, polyps, erosion, perforation             Throat- Mallampati IV , mucosa clear , drainage- none, tonsils- atrophic Neck- flexible , trachea midline, no stridor , thyroid nl, carotid no bruit Chest - symmetrical excursion , unlabored           Heart/CV- RRR , no murmur , no gallop  , no rub, nl s1 s2                           - JVD- none , edema- none, stasis changes- none, varices- none           Lung- clear  to P&A, wheeze- none, cough- none , dullness-none, rub- none           Chest wall-  Abd-  Br/ Gen/ Rectal- Not done, not indicated Extrem- cyanosis- none, clubbing, none, atrophy- none, strength- nl Neuro- grossly intact to observation

## 2021-02-16 ENCOUNTER — Other Ambulatory Visit: Payer: Self-pay

## 2021-02-16 ENCOUNTER — Encounter: Payer: Self-pay | Admitting: Internal Medicine

## 2021-02-16 ENCOUNTER — Ambulatory Visit (INDEPENDENT_AMBULATORY_CARE_PROVIDER_SITE_OTHER): Payer: Medicare Other | Admitting: Internal Medicine

## 2021-02-16 DIAGNOSIS — R911 Solitary pulmonary nodule: Secondary | ICD-10-CM

## 2021-02-16 DIAGNOSIS — J3089 Other allergic rhinitis: Secondary | ICD-10-CM | POA: Diagnosis not present

## 2021-02-16 DIAGNOSIS — J302 Other seasonal allergic rhinitis: Secondary | ICD-10-CM | POA: Diagnosis not present

## 2021-02-16 MED ORDER — MONTELUKAST SODIUM 10 MG PO TABS: ORAL_TABLET | ORAL | 4 refills | Status: DC

## 2021-02-16 MED ORDER — AZELASTINE-FLUTICASONE 137-50 MCG/ACT NA SUSP: NASAL | 12 refills | Status: DC

## 2021-02-16 NOTE — Patient Instructions (Signed)
Refills sent  Good luck with everything!   Please call if we can help

## 2021-02-20 ENCOUNTER — Ambulatory Visit: Payer: Medicare Other

## 2021-02-22 ENCOUNTER — Ambulatory Visit: Payer: Medicare Other | Attending: Internal Medicine | Admitting: Physical Therapy

## 2021-02-22 ENCOUNTER — Other Ambulatory Visit: Payer: Self-pay

## 2021-02-22 ENCOUNTER — Encounter: Payer: Self-pay | Admitting: Physical Therapy

## 2021-02-22 DIAGNOSIS — M542 Cervicalgia: Secondary | ICD-10-CM

## 2021-02-22 DIAGNOSIS — R293 Abnormal posture: Secondary | ICD-10-CM | POA: Diagnosis present

## 2021-02-22 DIAGNOSIS — M6281 Muscle weakness (generalized): Secondary | ICD-10-CM | POA: Insufficient documentation

## 2021-02-22 NOTE — Therapy (Signed)
[place], [place], [address] Phone: [phone]   Fax:  [phone]  Physical Therapy Evaluation  Patient Details  Name: Mary Malone MRN: [id number] Date of Birth: 30-Dec-1945 Referring Provider (PT): Dr. Pricilla Holm   Encounter Date: 02/22/2021   PT End of Session - 02/22/21 1653     Visit Number 1    Number of Visits 12    Date for PT Re-Evaluation 04/05/21    PT Start Time 0093    PT Stop Time 8182    PT Time Calculation (min) 49 min    Activity Tolerance Patient tolerated treatment well    Behavior During Therapy [facility] for tasks assessed/performed             Past Medical History:  Diagnosis Date   ALLERGIC RHINITIS    Back pain    Dense breast    DEPRESSION    Fibroid    GERD    Hearing loss    HYPERLIPIDEMIA    diet control efforts   Osteoarthritis    Osteoporosis    PMB (postmenopausal bleeding) 12/2001   polyps   Serum calcium elevated 2021   sees Dr.Ellison/pulmonology with [place]   Thyroid disease    hypothyroidism   URINARY INCONTINENCE    OAB   Wisdom teeth removed 1866/1967    Past Surgical History:  Procedure Laterality Date   BREAST CYST ASPIRATION  12/05/2001   CATARACT EXTRACTION Bilateral 07/2012, 09/2012   DILATION AND CURETTAGE OF UTERUS  2003   HYSTEROSCOPY  01/2002   resection, sm fibroid   Left knee surgey     MENISCUS REPAIR     left knee 2010   REFRACTIVE SURGERY  02/2016   WISDOM TOOTH EXTRACTION      There were no vitals filed for this visit.    Subjective Assessment - 02/22/21 1539     Subjective 3 mos history of neck pain .  She has difficulty performing household activities (back and neck pain - vacuum and dusting), getting out of bed, turning her head. She has some familial stressors right now and she admits she has been "putting herself last".  She endorses difficulty going from sitting to stand (needs A from hands). She recalls an  episode of dizziness and pain with changing a lightbulb looking up.  She hopes to gain some pain relief and restore functional mobility.    Pertinent History basal cell cancer radiation , osteoporosis, compression fracture in thoracic    Limitations Lifting;Standing;Walking;House hold activities;Other (comment);Reading   transfer   Diagnostic tests 5/22: Cervical spondylosis with foraminal narrowing bilaterally most  prominent at C5-6 bilaterally    Patient Stated Goals Would like the pain to go away so I can be less grouchy.  I cannot have surgery.  Preventing progression.    Currently in Pain? Yes    Pain Score 4     Pain Location Neck    Pain Orientation Posterior;Right;Left    Pain Descriptors / Indicators Aching;Tightness    Pain Type Chronic pain    Pain Radiating Towards shoulder    Pain Onset More than a month ago    Pain Frequency Constant    Aggravating Factors  turning head, getting out of bed,    Pain Relieving Factors resting, Aleve, heating pad not used    Effect of Pain on Daily Activities limits her comfort. I think about it all day .    Multiple Pain Sites Yes  Pain Score 4    Pain Location Back    Pain Orientation Mid;Upper    Pain Descriptors / Indicators Aching;Tightness    Pain Type Chronic pain    Pain Radiating Towards up to neck    Pain Onset More than a month ago    Pain Frequency Constant    Aggravating Factors  pushing, vacuuming    Pain Relieving Factors sitting, elevated legs    Effect of Pain on Daily Activities has to be careful, needs A with housewok                [facility] PT Assessment - 02/22/21 0001       Assessment   Medical Diagnosis cervical radiculopathy    Referring Provider (PT) Dr. Pricilla Holm    Onset Date/Surgical Date --   3 mos   Hand Dominance Right    Next MD Visit Sees Neurosurgeon Monday    Prior Therapy balance , vestibular not neck      Precautions   Precaution Comments dizzy with cervical extension ,  osteoporosis (flexion, rotation)      Restrictions   Weight Bearing Restrictions No      Balance Screen   Has the patient fallen in the past 6 months No      [place] residence    Living Arrangements Spouse/significant other      Prior Function   Level of Independence Independent    Vocation Retired    Network engineer    Leisure limited right now, caring for 3 other members of her family      Cognition   Overall Cognitive Status Within Functional Limits for tasks assessed      Observation/Other Assessments   Focus on Therapeutic Outcomes (FOTO)  42%      Sensation   Light Touch Appears Intact      Coordination   Gross Motor Movements are Fluid and Coordinated Not tested      Posture/Postural Control   Posture/Postural Control Postural limitations    Postural Limitations Rounded Shoulders;Forward head      AROM   Overall AROM Comments WFL in bilateral UEs    Cervical Flexion 43    Cervical Extension 40    Cervical - Right Side Bend 19    Cervical - Left Side Bend 28    Cervical - Right Rotation 30   pain   Cervical - Left Rotation 50   min pain     PROM   Overall PROM Comments WFL in UEs, cervical PROM limited in rotation      Strength   Right Shoulder Flexion 3+/5    Right Shoulder ABduction 3+/5    Left Shoulder Flexion 3+/5    Left Shoulder ABduction 3+/5    Right Elbow Flexion 4+/5    Left Elbow Flexion 4+/5    Right Hip Flexion 4/5    Left Hip Flexion 4/5    Right/Left Knee --   5/5   Right/Left Ankle --   DF 5/5     Flexibility   Soft Tissue Assessment /Muscle Length no      Palpation   Spinal mobility min hypomobility noted in C4- C5-C6    Palpation comment sore in upper trapsa, pain along suboccipitals      Bed Mobility   Bed Mobility Sit to Supine;Supine to Sit;Sit to Sidelying Right    Supine to Sit Independent    Sit to Supine Independent  Sit to Sidelying Right  Independent      Transfers   Five time sit to stand comments  12.9 sec no UE (basic chair > 90 deg)                        Objective measurements completed on examination: See above findings.       [facility] PT Treatment/Exercise - 02/22/21 0001       Self-Care   Self-Care Other Self-Care Comments;Posture    Posture spine, flexion and rotation precautions    Other Self-Care Comments  Handout from [facility]                    PT Education - 02/22/21 1653     Education Details PT/POC, osteoporosis    Person(s) Educated Patient    Methods Explanation;Handout    Comprehension Verbalized understanding;Returned demonstration                 PT Long Term Goals - 02/22/21 1655       PT LONG TERM GOAL #1   Title FOTO score will improve to 55% or better to demo greater functional mobility    Time 6    Period Weeks    Status New    Target Date 04/05/21      PT LONG TERM GOAL #2   Title pt will be I with HEP for posture, body mechanics, cervical stabliization and strength    Baseline --    Time 6    Period Weeks    Status New    Target Date 04/05/21      PT LONG TERM GOAL #3   Title UE strength will improve to 4+/5 or more in shoulder flexion , elbow flexion for more effective lifting and neck support    Baseline 3+/5    Time 6    Period Weeks    Status New    Target Date 04/05/21      PT LONG TERM GOAL #4   Title pt will be able to vacuum, do housework without increased neck pain    Time 6    Period Weeks    Status New    Target Date 04/05/21      PT LONG TERM GOAL #5   Title Pt will be able to improve sit to stand time to 10 sec without UEs to demo improved functional mobility    Time 6    Period Weeks    Status New    Target Date 04/05/21                    Plan - 02/22/21 1912     Clinical Impression Statement Pt presents to PT with new onset of neck pain due to spondylosis with radiation into  shoulders.  She presents with pain and stiffness in cervical, thoracic spine.  She has a history of compression fracture in her thoracic spine. She is unaware of precautions for osteoporosis. She has difficulty with transferring out of bed and needs to use her hands to get up from a chair. She has UE weakness, limited postural strength. She recently has to rely on a housekeeper as she cannot do her normal home taksa without increasing.    Personal Factors and Comorbidities Age;Comorbidity 3+    Comorbidities osteoporosis, compression fracture, skin CA    Examination-Activity Limitations Squat;Lift;Bed Mobility;Stairs;Bend;Locomotion Level;Stand;Caring for Others;Reach Overhead;Sit;Sleep;Transfers    Examination-Participation Restrictions Cleaning;Community  Activity;Driving;Interpersonal Relationship;Laundry    Stability/Clinical Decision Making Stable/Uncomplicated    Clinical Decision Making Low    Rehab Potential Excellent    PT Frequency 2x / week    PT Duration 6 weeks    PT Treatment/Interventions ADLs/Self Care Home Management;Functional mobility training;Taping;Therapeutic activities;Therapeutic exercise;Balance training;Patient/family education;Passive range of motion;Manual techniques;Dry needling;Moist Heat;Cryotherapy    PT Next Visit Plan develop HEP for cervical stab. Start supine. include lower body, core    PT Home Exercise Plan none , given info on osteo from [facility]    Consulted and Agree with Plan of Care Patient             Patient will benefit from skilled therapeutic intervention in order to improve the following deficits and impairments:  Decreased activity tolerance, Decreased strength, Increased fascial restricitons, Impaired flexibility, Impaired UE functional use, Pain, Postural dysfunction, Decreased range of motion, Decreased mobility, Decreased balance, Improper body mechanics  Visit Diagnosis: Cervicalgia  Muscle weakness (generalized)  Abnormal  posture     Problem List Patient Active Problem List   Diagnosis Date Noted   Cervical radiculopathy 02/03/2021   Basal cell carcinoma (BCC) of lateral side wall of nose 11/09/2020   Hypercalcemia 04/20/2020   Numbness 04/19/2020   Left leg pain 07/08/2018   Sacral back pain 04/16/2018   Lung nodule 02/19/2017   Osteoporosis 08/09/2016   Hearing loss 02/21/2016   White matter abnormality on MRI of brain 07/11/2015   MCI (mild cognitive impairment) 07/11/2015   Routine general medical examination at a health care facility 02/11/2015   OAB (overactive bladder)    GERD 09/23/2007   Perennial allergic rhinitis with seasonal variation 09/22/2007    Chevon Laufer 02/22/2021, 7:29 PM  [facility] [address] [place], [place], [address] Phone: [phone]   Fax:  [phone]  Name: HARPER VANDERVOORT MRN: [id number] Date of Birth: Mar 07, 1946  .jen

## 2021-02-28 ENCOUNTER — Ambulatory Visit: Payer: Medicare Other

## 2021-02-28 ENCOUNTER — Other Ambulatory Visit: Payer: Self-pay

## 2021-02-28 DIAGNOSIS — M6281 Muscle weakness (generalized): Secondary | ICD-10-CM

## 2021-02-28 DIAGNOSIS — M542 Cervicalgia: Secondary | ICD-10-CM | POA: Diagnosis not present

## 2021-02-28 DIAGNOSIS — R293 Abnormal posture: Secondary | ICD-10-CM

## 2021-02-28 NOTE — Therapy (Signed)
[place], [place], [address] Phone: [phone]   Fax:  [phone]  Physical Therapy Treatment  Patient Details  Name: Mary Malone MRN: [id number] Date of Birth: Mar 28, 1946 Referring Provider (PT): Dr. Pricilla Holm   Encounter Date: 02/28/2021   PT End of Session - 02/28/21 1038     Visit Number 2    Number of Visits 12    Date for PT Re-Evaluation 04/05/21    Authorization Type UHC MCR    Progress Note Due on Visit 10    PT Start Time 6962    PT Stop Time 1125    PT Time Calculation (min) 40 min    Activity Tolerance Patient tolerated treatment well    Behavior During Therapy [facility] for tasks assessed/performed             Past Medical History:  Diagnosis Date   ALLERGIC RHINITIS    Back pain    Dense breast    DEPRESSION    Fibroid    GERD    Hearing loss    HYPERLIPIDEMIA    diet control efforts   Osteoarthritis    Osteoporosis    PMB (postmenopausal bleeding) 12/2001   polyps   Serum calcium elevated 2021   sees Dr.Ellison/pulmonology with [place]   Thyroid disease    hypothyroidism   URINARY INCONTINENCE    OAB   Wisdom teeth removed 1866/1967    Past Surgical History:  Procedure Laterality Date   BREAST CYST ASPIRATION  12/05/2001   CATARACT EXTRACTION Bilateral 07/2012, 09/2012   DILATION AND CURETTAGE OF UTERUS  2003   HYSTEROSCOPY  01/2002   resection, sm fibroid   Left knee surgey     MENISCUS REPAIR     left knee 2010   REFRACTIVE SURGERY  02/2016   WISDOM TOOTH EXTRACTION      There were no vitals filed for this visit.   Subjective Assessment - 02/28/21 1038     Subjective Pt presents to PT with contined reports of neck pain and stiffness. She had a visit with neurosurgery yesterday and states they want to see how PT goes before making a determination on MRI. Pt is ready to begin PT at this time.    Currently in Pain? Yes    Pain Score 4     Pain  Location Neck    Pain Orientation Posterior;Left;Right                               [facility] Adult PT Treatment/Exercise - 02/28/21 0001       Exercises   Exercises Neck;Shoulder      Neck Exercises: Machines for Strengthening   Nustep lvl 4 x 3 min UE/LE while taking subjective      Neck Exercises: Seated   Neck Retraction 10 reps;3 secs    Neck Retraction Limitations x 2    Other Seated Exercise cervical extension SNAG w/ towel x 15      Shoulder Exercises: Supine   Horizontal ABduction 10 reps;Theraband    Theraband Level (Shoulder Horizontal ABduction) Level 1 (Yellow)    Horizontal ABduction Limitations x 2      Shoulder Exercises: Standing   Extension 10 reps;Theraband;Both    Theraband Level (Shoulder Extension) Level 2 (Red)    Extension Limitations x 2    Row 10 reps;Theraband    Theraband Level (Shoulder Row) Level 2 (Red)  Row Limitations x 2      Manual Therapy   Manual therapy comments suboccipital release, trigger point and positional release to bilateral upper traps                         PT Long Term Goals - 02/22/21 1655       PT LONG TERM GOAL #1   Title FOTO score will improve to 55% or better to demo greater functional mobility    Time 6    Period Weeks    Status New    Target Date 04/05/21      PT LONG TERM GOAL #2   Title pt will be I with HEP for posture, body mechanics, cervical stabliization and strength    Baseline --    Time 6    Period Weeks    Status New    Target Date 04/05/21      PT LONG TERM GOAL #3   Title UE strength will improve to 4+/5 or more in shoulder flexion , elbow flexion for more effective lifting and neck support    Baseline 3+/5    Time 6    Period Weeks    Status New    Target Date 04/05/21      PT LONG TERM GOAL #4   Title pt will be able to vacuum, do housework without increased neck pain    Time 6    Period Weeks    Status New    Target Date 04/05/21      PT  LONG TERM GOAL #5   Title Pt will be able to improve sit to stand time to 10 sec without UEs to demo improved functional mobility    Time 6    Period Weeks    Status New    Target Date 04/05/21                   Plan - 02/28/21 1354     Clinical Impression Statement Pt was able to complete prescribed exercises with no adverse effect. HEP created for periscapular strengthening and postural re-education. Pt responded well to manual therpay interventions with slight decrease in discomfort noted post session. Will continue to progress as able.    PT Treatment/Interventions ADLs/Self Care Home Management;Functional mobility training;Taping;Therapeutic activities;Therapeutic exercise;Balance training;Patient/family education;Passive range of motion;Manual techniques;Dry needling;Moist Heat;Cryotherapy    PT Next Visit Plan assess response to HEP - add cervical stab supine, included lower body and core    PT Home Exercise Plan [facility]             Patient will benefit from skilled therapeutic intervention in order to improve the following deficits and impairments:  Decreased activity tolerance, Decreased strength, Increased fascial restricitons, Impaired flexibility, Impaired UE functional use, Pain, Postural dysfunction, Decreased range of motion, Decreased mobility, Decreased balance, Improper body mechanics  Visit Diagnosis: Cervicalgia  Muscle weakness (generalized)  Abnormal posture     Problem List Patient Active Problem List   Diagnosis Date Noted   Cervical radiculopathy 02/03/2021   Basal cell carcinoma (BCC) of lateral side wall of nose 11/09/2020   Hypercalcemia 04/20/2020   Numbness 04/19/2020   Left leg pain 07/08/2018   Sacral back pain 04/16/2018   Lung nodule 02/19/2017   Osteoporosis 08/09/2016   Hearing loss 02/21/2016   White matter abnormality on MRI of brain 07/11/2015   MCI (mild cognitive impairment) 07/11/2015   Routine general medical  examination at a  health care facility 02/11/2015   OAB (overactive bladder)    GERD 09/23/2007   Perennial allergic rhinitis with seasonal variation 09/22/2007    Ward Chatters, PT, DPT 02/28/21 2:01 PM  [facility] [address] [place], [place], [address] Phone: [phone]   Fax:  [phone]  Name: LANIAH GRIMM MRN: [id number] Date of Birth: 1946/02/24

## 2021-03-02 ENCOUNTER — Ambulatory Visit: Payer: Medicare Other

## 2021-03-02 ENCOUNTER — Other Ambulatory Visit: Payer: Self-pay

## 2021-03-02 DIAGNOSIS — M6281 Muscle weakness (generalized): Secondary | ICD-10-CM

## 2021-03-02 DIAGNOSIS — M542 Cervicalgia: Secondary | ICD-10-CM | POA: Diagnosis not present

## 2021-03-02 DIAGNOSIS — R293 Abnormal posture: Secondary | ICD-10-CM

## 2021-03-02 NOTE — Therapy (Signed)
[place], [place], [address] Phone: [phone]   Fax:  [phone]  Physical Therapy Treatment  Patient Details  Name: Mary Malone MRN: [id number] Date of Birth: May 21, 1946 Referring Provider (PT): Dr. Pricilla Holm   Encounter Date: 03/02/2021   PT End of Session - 03/02/21 1233     Visit Number 3    Number of Visits 12    Date for PT Re-Evaluation 04/05/21    Authorization Type UHC MCR    Progress Note Due on Visit 10    PT Start Time 8280    PT Stop Time 1312    PT Time Calculation (min) 39 min    Activity Tolerance Patient tolerated treatment well    Behavior During Therapy [facility] for tasks assessed/performed             Past Medical History:  Diagnosis Date   ALLERGIC RHINITIS    Back pain    Dense breast    DEPRESSION    Fibroid    GERD    Hearing loss    HYPERLIPIDEMIA    diet control efforts   Osteoarthritis    Osteoporosis    PMB (postmenopausal bleeding) 12/2001   polyps   Serum calcium elevated 2021   sees Dr.Ellison/pulmonology with [place]   Thyroid disease    hypothyroidism   URINARY INCONTINENCE    OAB   Wisdom teeth removed 1866/1967    Past Surgical History:  Procedure Laterality Date   BREAST CYST ASPIRATION  12/05/2001   CATARACT EXTRACTION Bilateral 07/2012, 09/2012   DILATION AND CURETTAGE OF UTERUS  2003   HYSTEROSCOPY  01/2002   resection, sm fibroid   Left knee surgey     MENISCUS REPAIR     left knee 2010   REFRACTIVE SURGERY  02/2016   WISDOM TOOTH EXTRACTION      There were no vitals filed for this visit.   Subjective Assessment - 03/02/21 1235     Subjective Patient reports she is a little sore from Tuesday. She would like to review her exercises as she is unsure how to set them up at home.    Currently in Pain? Yes    Pain Score 5     Pain Location Neck    Pain Orientation Posterior    Pain Descriptors / Indicators Sore    Pain  Type Chronic pain    Pain Onset More than a month ago    Pain Frequency Constant    Multiple Pain Sites No                               [facility] Adult PT Treatment/Exercise - 03/02/21 0001       Self-Care   Other Self-Care Comments  see patient education      Neck Exercises: Machines for Strengthening   UBE (Upper Arm Bike) 2 min each fwd/bwd level 1      Neck Exercises: Supine   Neck Retraction 10 reps    Neck Retraction Limitations x2      Shoulder Exercises: Supine   Flexion 10 reps    Theraband Level (Shoulder Flexion) Level 1 (Yellow)    Flexion Limitations x2      Shoulder Exercises: Seated   External Rotation 10 reps    Theraband Level (Shoulder External Rotation) Level 1 (Yellow)    External Rotation Limitations x2      Shoulder  Exercises: Stretch   Other Shoulder Stretches pec doorway stretch 2 x 30 sec      Manual Therapy   Manual therapy comments STM/trigger point release bilateral upper trap/levator scap                    PT Education - 03/02/21 1312     Education Details Updated HEP, reviewed setup for resisted rows and extension sa part of current HEP.    Person(s) Educated Patient    Methods Explanation;Demonstration;Verbal cues;Handout    Comprehension Verbalized understanding;Returned demonstration;Verbal cues required                 PT Long Term Goals - 02/22/21 1655       PT LONG TERM GOAL #1   Title FOTO score will improve to 55% or better to demo greater functional mobility    Time 6    Period Weeks    Status New    Target Date 04/05/21      PT LONG TERM GOAL #2   Title pt will be I with HEP for posture, body mechanics, cervical stabliization and strength    Baseline --    Time 6    Period Weeks    Status New    Target Date 04/05/21      PT LONG TERM GOAL #3   Title UE strength will improve to 4+/5 or more in shoulder flexion , elbow flexion for more effective lifting and neck support     Baseline 3+/5    Time 6    Period Weeks    Status New    Target Date 04/05/21      PT LONG TERM GOAL #4   Title pt will be able to vacuum, do housework without increased neck pain    Time 6    Period Weeks    Status New    Target Date 04/05/21      PT LONG TERM GOAL #5   Title Pt will be able to improve sit to stand time to 10 sec without UEs to demo improved functional mobility    Time 6    Period Weeks    Status New    Target Date 04/05/21                   Plan - 03/02/21 1238     Clinical Impression Statement Patient tolerated session well today without increased pain reported. Began DNF training with patient requiring cues to decrease excessive upper trap and SCM engagement when performing supine cervical retraction with ability to correct once cued. Continued with periscapular and shoulder strengthening with patient reporting shoulder soreness at the end of her session.    PT Treatment/Interventions ADLs/Self Care Home Management;Functional mobility training;Taping;Therapeutic activities;Therapeutic exercise;Balance training;Patient/family education;Passive range of motion;Manual techniques;Dry needling;Moist Heat;Cryotherapy    PT Next Visit Plan manual therapy as needed, continue cervical and periscapular strengthening. included lower body and core    PT Home Exercise Plan [id number]    Consulted and Agree with Plan of Care Patient             Patient will benefit from skilled therapeutic intervention in order to improve the following deficits and impairments:  Decreased activity tolerance, Decreased strength, Increased fascial restricitons, Impaired flexibility, Impaired UE functional use, Pain, Postural dysfunction, Decreased range of motion, Decreased mobility, Decreased balance, Improper body mechanics  Visit Diagnosis: Cervicalgia  Muscle weakness (generalized)  Abnormal posture     Problem List Patient  Active Problem List   Diagnosis Date Noted    Cervical radiculopathy 02/03/2021   Basal cell carcinoma (BCC) of lateral side wall of nose 11/09/2020   Hypercalcemia 04/20/2020   Numbness 04/19/2020   Left leg pain 07/08/2018   Sacral back pain 04/16/2018   Lung nodule 02/19/2017   Osteoporosis 08/09/2016   Hearing loss 02/21/2016   White matter abnormality on MRI of brain 07/11/2015   MCI (mild cognitive impairment) 07/11/2015   Routine general medical examination at a health care facility 02/11/2015   OAB (overactive bladder)    GERD 09/23/2007   Perennial allergic rhinitis with seasonal variation 09/22/2007   Gwendolyn Grant, PT, DPT, ATC 03/02/21 1:25 PM   [facility] [address] [place], [place], [address] Phone: [phone]   Fax:  [phone]  Name: JERIYAH GRANLUND MRN: [id number] Date of Birth: [birth year]

## 2021-03-09 ENCOUNTER — Ambulatory Visit: Payer: Medicare Other | Attending: Internal Medicine | Admitting: Physical Therapy

## 2021-03-09 ENCOUNTER — Other Ambulatory Visit: Payer: Self-pay

## 2021-03-09 ENCOUNTER — Encounter: Payer: Self-pay | Admitting: Physical Therapy

## 2021-03-09 DIAGNOSIS — R293 Abnormal posture: Secondary | ICD-10-CM | POA: Insufficient documentation

## 2021-03-09 DIAGNOSIS — M542 Cervicalgia: Secondary | ICD-10-CM | POA: Diagnosis present

## 2021-03-09 DIAGNOSIS — M6281 Muscle weakness (generalized): Secondary | ICD-10-CM | POA: Insufficient documentation

## 2021-03-09 NOTE — Therapy (Signed)
[place], [place], [address] Phone: [phone]   Fax:  [phone]  Physical Therapy Treatment  Patient Details  Name: Mary Malone MRN: [id number] Date of Birth: 1945/11/28 Referring Provider (PT): Dr. Pricilla Holm   Encounter Date: 03/09/2021   PT End of Session - 03/09/21 1236     Visit Number 4    Number of Visits 12    Date for PT Re-Evaluation 04/05/21    Authorization Type UHC MCR    PT Start Time 2831    PT Stop Time 1330    PT Time Calculation (min) 60 min    Activity Tolerance Patient tolerated treatment well    Behavior During Therapy [facility] for tasks assessed/performed             Past Medical History:  Diagnosis Date   ALLERGIC RHINITIS    Back pain    Dense breast    DEPRESSION    Fibroid    GERD    Hearing loss    HYPERLIPIDEMIA    diet control efforts   Osteoarthritis    Osteoporosis    PMB (postmenopausal bleeding) 12/2001   polyps   Serum calcium elevated 2021   sees Dr.Ellison/pulmonology with [place]   Thyroid disease    hypothyroidism   URINARY INCONTINENCE    OAB   Wisdom teeth removed 1866/1967    Past Surgical History:  Procedure Laterality Date   BREAST CYST ASPIRATION  12/05/2001   CATARACT EXTRACTION Bilateral 07/2012, 09/2012   DILATION AND CURETTAGE OF UTERUS  2003   HYSTEROSCOPY  01/2002   resection, sm fibroid   Left knee surgey     MENISCUS REPAIR     left knee 2010   REFRACTIVE SURGERY  02/2016   WISDOM TOOTH EXTRACTION      There were no vitals filed for this visit.   Subjective Assessment - 03/09/21 1234     Subjective Patient reports pain 4/10 which is same.  The exercises arent helping.  L shoulder painful.    Diagnostic tests 5/22: Cervical spondylosis with foraminal narrowing bilaterally most  prominent at C5-6 bilaterally    Currently in Pain? Yes    Pain Score 4     Pain Location Neck    Pain Orientation  Left;Posterior;Right   L>R   Pain Descriptors / Indicators Aching;Sore    Pain Type Chronic pain    Pain Onset More than a month ago    Pain Frequency Constant    Aggravating Factors  turning head, lifting head from the bed    Pain Relieving Factors rest, aleve                   [facility] Adult PT Treatment/Exercise - 03/09/21 0001       Neck Exercises: Supine   Neck Retraction 10 reps    Neck Retraction Limitations x2 with ball under neck    Cervical Rotation Both;10 reps    Cervical Rotation Limitations has to close eyes used ball under neck to release tension    Shoulder Flexion 15 reps    Shoulder Flexion Weights (lbs) red band without tension      Neck Exercises: Stabilization   Stabilization horizontal pull red band x 10 then ERred band x 15 with chin tuck      Shoulder Exercises: Supine   Horizontal ABduction Theraband;15 reps    Theraband Level (Shoulder Horizontal ABduction) Level 2 (Red)    External Rotation Strengthening;Both;15  reps;Theraband    Theraband Level (Shoulder External Rotation) Level 2 (Red)    Flexion 10 reps    Theraband Level (Shoulder Flexion) Level 2 (Red)      Shoulder Exercises: ROM/Strengthening   UBE (Upper Arm Bike) 5 min L1 cues for posture      Modalities   Modalities Moist Heat      Moist Heat Therapy   Number Minutes Moist Heat 8 Minutes    Moist Heat Location Shoulder;Cervical   L side     Manual Therapy   Manual Therapy Soft tissue mobilization;Passive ROM    Soft tissue mobilization post cervicals bilateral, L suboccipitals painful and medial scap border L side > R    Passive ROM rotation and sidebending      Neck Exercises: Stretches   Upper Trapezius Stretch 20 seconds;2 reps    Levator Stretch 2 reps;20 seconds    Lower Cervical/Upper Thoracic Stretch 3 reps;20 seconds    Other Neck Stretches small ROM extension and flexion towel x 10    Other Neck Stretches rotation with light overpressure by pt.  x 3 each side                     PT Education - 03/09/21 1321     Education Details cervical ROM    Person(s) Educated Patient    Methods Explanation;Handout    Comprehension Verbalized understanding;Returned demonstration                 PT Long Term Goals - 02/22/21 1655       PT LONG TERM GOAL #1   Title FOTO score will improve to 55% or better to demo greater functional mobility    Time 6    Period Weeks    Status New    Target Date 04/05/21      PT LONG TERM GOAL #2   Title pt will be I with HEP for posture, body mechanics, cervical stabliization and strength    Baseline --    Time 6    Period Weeks    Status New    Target Date 04/05/21      PT LONG TERM GOAL #3   Title UE strength will improve to 4+/5 or more in shoulder flexion , elbow flexion for more effective lifting and neck support    Baseline 3+/5    Time 6    Period Weeks    Status New    Target Date 04/05/21      PT LONG TERM GOAL #4   Title pt will be able to vacuum, do housework without increased neck pain    Time 6    Period Weeks    Status New    Target Date 04/05/21      PT LONG TERM GOAL #5   Title Pt will be able to improve sit to stand time to 10 sec without UEs to demo improved functional mobility    Time 6    Period Weeks    Status New    Target Date 04/05/21                   Plan - 03/09/21 1238     Clinical Impression Statement No changes noted with therapy intervention thus far.  Manual therapy helps temporarily.    PT Treatment/Interventions ADLs/Self Care Home Management;Functional mobility training;Taping;Therapeutic activities;Therapeutic exercise;Balance training;Patient/family education;Passive range of motion;Manual techniques;Dry needling;Moist Heat;Cryotherapy    PT Next Visit Plan manual therapy  as needed, continue cervical and periscapular strengthening. included lower body and core    PT Home Exercise Plan [id number]    Consulted and Agree with Plan of Care  Patient             Patient will benefit from skilled therapeutic intervention in order to improve the following deficits and impairments:  Decreased activity tolerance, Decreased strength, Increased fascial restricitons, Impaired flexibility, Impaired UE functional use, Pain, Postural dysfunction, Decreased range of motion, Decreased mobility, Decreased balance, Improper body mechanics  Visit Diagnosis: Cervicalgia  Muscle weakness (generalized)  Abnormal posture     Problem List Patient Active Problem List   Diagnosis Date Noted   Cervical radiculopathy 02/03/2021   Basal cell carcinoma (BCC) of lateral side wall of nose 11/09/2020   Hypercalcemia 04/20/2020   Numbness 04/19/2020   Left leg pain 07/08/2018   Sacral back pain 04/16/2018   Lung nodule 02/19/2017   Osteoporosis 08/09/2016   Hearing loss 02/21/2016   White matter abnormality on MRI of brain 07/11/2015   MCI (mild cognitive impairment) 07/11/2015   Routine general medical examination at a health care facility 02/11/2015   OAB (overactive bladder)    GERD 09/23/2007   Perennial allergic rhinitis with seasonal variation 09/22/2007    Abena Erdman 03/09/2021, 1:22 PM  [facility] [address] [place], [place], [address] Phone: [phone]   Fax:  [phone]  Name: Mary Malone MRN: [id number] Date of Birth: 10-Nov-1945  Raeford Razor, PT 03/09/21 1:22 PM Phone: [phone] Fax: [phone]

## 2021-03-14 ENCOUNTER — Other Ambulatory Visit: Payer: Self-pay

## 2021-03-14 ENCOUNTER — Ambulatory Visit: Payer: Medicare Other | Admitting: Physical Therapy

## 2021-03-14 ENCOUNTER — Encounter: Payer: Self-pay | Admitting: Physical Therapy

## 2021-03-14 ENCOUNTER — Telehealth: Payer: Self-pay | Admitting: Internal Medicine

## 2021-03-14 DIAGNOSIS — M6281 Muscle weakness (generalized): Secondary | ICD-10-CM

## 2021-03-14 DIAGNOSIS — R293 Abnormal posture: Secondary | ICD-10-CM

## 2021-03-14 DIAGNOSIS — M542 Cervicalgia: Secondary | ICD-10-CM

## 2021-03-14 NOTE — Therapy (Signed)
[place], [place], [address] Phone: [phone]   Fax:  [phone]  Physical Therapy Treatment  Patient Details  Name: Mary Malone MRN: [id number] Date of Birth: 06-23-1946 Referring Provider (PT): Dr. Pricilla Holm   Encounter Date: 03/14/2021   PT End of Session - 03/14/21 1754     Visit Number 5    Number of Visits 12    Date for PT Re-Evaluation 04/05/21    Authorization Type UHC MCR    PT Start Time 1106    PT Stop Time 1200    PT Time Calculation (min) 54 min    Activity Tolerance Patient tolerated treatment well    Behavior During Therapy [facility] for tasks assessed/performed             Past Medical History:  Diagnosis Date   ALLERGIC RHINITIS    Back pain    Dense breast    DEPRESSION    Fibroid    GERD    Hearing loss    HYPERLIPIDEMIA    diet control efforts   Osteoarthritis    Osteoporosis    PMB (postmenopausal bleeding) 12/2001   polyps   Serum calcium elevated 2021   sees Dr.Ellison/pulmonology with [place]   Thyroid disease    hypothyroidism   URINARY INCONTINENCE    OAB   Wisdom teeth removed 1866/1967    Past Surgical History:  Procedure Laterality Date   BREAST CYST ASPIRATION  12/05/2001   CATARACT EXTRACTION Bilateral 07/2012, 09/2012   DILATION AND CURETTAGE OF UTERUS  2003   HYSTEROSCOPY  01/2002   resection, sm fibroid   Left knee surgey     MENISCUS REPAIR     left knee 2010   REFRACTIVE SURGERY  02/2016   WISDOM TOOTH EXTRACTION      There were no vitals filed for this visit.   Subjective Assessment - 03/14/21 1106     Subjective I must be doing something wrong with the exercises because my low back is nagging.  Noticed it Sunday AM. Neck is 3/10.  The massage helped.    Currently in Pain? Yes    Pain Score 3     Pain Orientation Right;Left;Posterior    Pain Descriptors / Indicators Aching    Pain Type Chronic pain    Pain Onset More  than a month ago    Pain Frequency Constant    Pain Score 4    Pain Location Back    Pain Orientation Right;Left;Lower    Pain Descriptors / Indicators Aching    Pain Type Chronic pain    Pain Onset In the past 7 days    Pain Frequency Intermittent    Aggravating Factors  overactivity, now is constant    Pain Relieving Factors rest                      [facility] Adult PT Treatment/Exercise - 03/14/21 0001       Shoulder Exercises: Standing   Horizontal ABduction Strengthening;Both;10 reps    Theraband Level (Shoulder Horizontal ABduction) Level 2 (Red)    External Rotation Strengthening;Both;20 reps;Theraband    Theraband Level (Shoulder External Rotation) Level 3 (Green)    External Rotation Weight (lbs) cues for posture, core to protect back      Shoulder Exercises: ROM/Strengthening   Nustep L5 UE and LE for 6 min      Shoulder Exercises: Stretch   Other Shoulder Stretches LTR with  head turns x 10 each side      Modalities   Modalities Moist Heat      Moist Heat Therapy   Number Minutes Moist Heat 10 Minutes    Moist Heat Location Shoulder;Cervical   L side     Manual Therapy   Manual Therapy Soft tissue mobilization;Passive ROM    Manual therapy comments PROM lateral flexion    Soft tissue mobilization post cervicals bilateral, L suboccipitals painful and medial scap border L side > R    Passive ROM rotation and sidebending      Neck Exercises: Stretches   Upper Trapezius Stretch 2 reps;30 seconds    Levator Stretch 2 reps;30 seconds                    PT Education - 03/14/21 1114     Education Details HEP                 PT Long Term Goals - 03/14/21 1810       PT LONG TERM GOAL #1   Title FOTO score will improve to 55% or better to demo greater functional mobility    Status On-going      PT LONG TERM GOAL #2   Title pt will be I with HEP for posture, body mechanics, cervical stabliization and strength    Status On-going       PT LONG TERM GOAL #3   Title UE strength will improve to 4+/5 or more in shoulder flexion , elbow flexion for more effective lifting and neck support    Status On-going      PT LONG TERM GOAL #4   Title pt will be able to vacuum, do housework without increased neck pain    Status On-going      PT LONG TERM GOAL #5   Title Pt will be able to improve sit to stand time to 10 sec without UEs to demo improved functional mobility    Status On-going                   Plan - 03/14/21 1115     Clinical Impression Statement Makayia reported increased pain in low back which is chronic but flared up recently. She wondered if it was due to poor technique with HEP.  She may have been hyperextending in her low back with standing shoulder exercises.  Given basic trunk rotation for low back and neck as well.    PT Treatment/Interventions ADLs/Self Care Home Management;Functional mobility training;Taping;Therapeutic activities;Therapeutic exercise;Balance training;Patient/family education;Passive range of motion;Manual techniques;Dry needling;Moist Heat;Cryotherapy    PT Next Visit Plan foto, manual therapy as needed, continue cervical and periscapular strengthening. included lower body and core    PT Home Exercise Plan [id number]    Consulted and Agree with Plan of Care Patient             Patient will benefit from skilled therapeutic intervention in order to improve the following deficits and impairments:  Decreased activity tolerance, Decreased strength, Increased fascial restricitons, Impaired flexibility, Impaired UE functional use, Pain, Postural dysfunction, Decreased range of motion, Decreased mobility, Decreased balance, Improper body mechanics  Visit Diagnosis: Cervicalgia  Muscle weakness (generalized)  Abnormal posture     Problem List Patient Active Problem List   Diagnosis Date Noted   Cervical radiculopathy 02/03/2021   Basal cell carcinoma (BCC) of lateral side  wall of nose 11/09/2020   Hypercalcemia 04/20/2020   Numbness 04/19/2020   Left  leg pain 07/08/2018   Sacral back pain 04/16/2018   Lung nodule 02/19/2017   Osteoporosis 08/09/2016   Hearing loss 02/21/2016   White matter abnormality on MRI of brain 07/11/2015   MCI (mild cognitive impairment) 07/11/2015   Routine general medical examination at a health care facility 02/11/2015   OAB (overactive bladder)    GERD 09/23/2007   Perennial allergic rhinitis with seasonal variation 09/22/2007    Manveer Gomes 03/14/2021, 6:35 PM  [facility] [address] [place], [place], [address] Phone: [phone]   Fax:  [phone]  Name: MADY OUBRE MRN: [id number] Date of Birth: 1945-09-30   Raeford Razor, PT 03/14/21 6:35 PM Phone: [phone] Fax: [phone]

## 2021-03-14 NOTE — Telephone Encounter (Signed)
LVM for pt to rtn my call to schedule awv with nha.. Please schedule AWV if pt calls the office.  

## 2021-03-16 ENCOUNTER — Encounter: Payer: Self-pay | Admitting: Physical Therapy

## 2021-03-16 ENCOUNTER — Other Ambulatory Visit: Payer: Self-pay

## 2021-03-16 ENCOUNTER — Ambulatory Visit: Payer: Medicare Other | Admitting: Physical Therapy

## 2021-03-16 DIAGNOSIS — M542 Cervicalgia: Secondary | ICD-10-CM

## 2021-03-16 DIAGNOSIS — R293 Abnormal posture: Secondary | ICD-10-CM

## 2021-03-16 DIAGNOSIS — M6281 Muscle weakness (generalized): Secondary | ICD-10-CM

## 2021-03-16 NOTE — Therapy (Signed)
[place], [place], [place] Phone: [phone]   Fax:  [phone]  Physical Therapy Treatment  Patient Details  Name: Mary Malone MRN: [id number] Date of Birth: 1946/05/23 Referring Provider (PT): Dr. Pricilla Holm   Encounter Date: 03/16/2021   PT End of Session - 03/16/21 1112     Visit Number 6    Number of Visits 12    Date for PT Re-Evaluation 04/05/21    Authorization Type UHC MCR    PT Start Time 9798    PT Stop Time 9211    PT Time Calculation (min) 43 min             Past Medical History:  Diagnosis Date   ALLERGIC RHINITIS    Back pain    Dense breast    DEPRESSION    Fibroid    GERD    Hearing loss    HYPERLIPIDEMIA    diet control efforts   Osteoarthritis    Osteoporosis    PMB (postmenopausal bleeding) 12/2001   polyps   Serum calcium elevated 2021   sees Dr.Ellison/pulmonology with [place]   Thyroid disease    hypothyroidism   URINARY INCONTINENCE    OAB   Wisdom teeth removed 1866/1967    Past Surgical History:  Procedure Laterality Date   BREAST CYST ASPIRATION  12/05/2001   CATARACT EXTRACTION Bilateral 07/2012, 09/2012   DILATION AND CURETTAGE OF UTERUS  2003   HYSTEROSCOPY  01/2002   resection, sm fibroid   Left knee surgey     MENISCUS REPAIR     left knee 2010   REFRACTIVE SURGERY  02/2016   WISDOM TOOTH EXTRACTION      There were no vitals filed for this visit.   Subjective Assessment - 03/16/21 1106     Subjective Neck is 3/10.  The back is OK.  It helps when you do the exercises right.    Currently in Pain? Yes    Pain Score 3     Pain Location Neck    Pain Score --   not rated   Pain Location Back                 [facility] Adult PT Treatment/Exercise - 03/16/21 0001       Neck Exercises: Theraband   Shoulder External Rotation 10 reps;Red    Horizontal ABduction 10 reps;Red      Neck Exercises: Standing   Neck Retraction  10 reps;5 secs    Wall Push Ups 10 reps    Upper Extremity Flexion with Stabilization 10 reps    UE Flexion with Stabilization Limitations mod cues    Other Standing Exercises shoulder front raise x 10 , lateral raise x 10 , 2 lbs used wall for posture    Other Standing Exercises forward fold on high mat table x 5 , foam roller pressing out and in      Shoulder Exercises: Stretch   Other Shoulder Stretches LTR with head turns x 10 each side      Manual Therapy   Manual therapy comments light suboccipital release and XFF along occipitals      Neck Exercises: Stretches   Upper Trapezius Stretch 2 reps;30 seconds    Levator Stretch 2 reps;30 seconds    Corner Stretch 3 reps;30 seconds                         PT  Long Term Goals - 03/16/21 1113       PT LONG TERM GOAL #1   Title FOTO score will improve to 55% or better to demo greater functional mobility    Baseline 51%    Status On-going      PT LONG TERM GOAL #2   Title pt will be I with HEP for posture, body mechanics, cervical stabliization and strength    Status On-going      PT LONG TERM GOAL #3   Title UE strength will improve to 4+/5 or more in shoulder flexion , elbow flexion for more effective lifting and neck support    Status On-going      PT LONG TERM GOAL #4   Title pt will be able to do laundry, housework without increased neck pain    Status On-going      PT LONG TERM GOAL #5   Title Pt will be able to improve sit to stand time to 10 sec without UEs to demo improved functional mobility    Status Unable to assess                   Plan - 03/16/21 1107     Clinical Impression Statement Pt has made improvement in FOTO score, 9% better.  She cont to need work on UE strength, posture in uppoer back and neck.  Limited by fatigue in the home as well, min low back pain .  She is benefitting from PT. Trial of no soft tissue work and no heat to see if she can manage without.    PT  Treatment/Interventions ADLs/Self Care Home Management;Functional mobility training;Taping;Therapeutic activities;Therapeutic exercise;Balance training;Patient/family education;Passive range of motion;Manual techniques;Dry needling;Moist Heat;Cryotherapy    PT Next Visit Plan endurance, 5 x STS, check MMT in UEs. manual therapy as needed, continue cervical and periscapular strengthening. included lower body and core    PT Home Exercise Plan [id number]    Consulted and Agree with Plan of Care Patient             Patient will benefit from skilled therapeutic intervention in order to improve the following deficits and impairments:  Decreased activity tolerance, Decreased strength, Increased fascial restricitons, Impaired flexibility, Impaired UE functional use, Pain, Postural dysfunction, Decreased range of motion, Decreased mobility, Decreased balance, Improper body mechanics  Visit Diagnosis: Cervicalgia  Muscle weakness (generalized)  Abnormal posture     Problem List Patient Active Problem List   Diagnosis Date Noted   Cervical radiculopathy 02/03/2021   Basal cell carcinoma (BCC) of lateral side wall of nose 11/09/2020   Hypercalcemia 04/20/2020   Numbness 04/19/2020   Left leg pain 07/08/2018   Sacral back pain 04/16/2018   Lung nodule 02/19/2017   Osteoporosis 08/09/2016   Hearing loss 02/21/2016   White matter abnormality on MRI of brain 07/11/2015   MCI (mild cognitive impairment) 07/11/2015   Routine general medical examination at a health care facility 02/11/2015   OAB (overactive bladder)    GERD 09/23/2007   Perennial allergic rhinitis with seasonal variation 09/22/2007    Mary Malone 03/16/2021, 11:47 AM  [facility] [address] [place], [place], [address] Phone: [phone]   Fax:  [phone]  Name: Mary Malone MRN: [id number] Date of Birth: 03/24/1946   Raeford Razor, PT 03/16/21 11:47 AM Phone:  [phone] Fax: [phone]

## 2021-03-21 ENCOUNTER — Encounter: Payer: Self-pay | Admitting: Physical Therapy

## 2021-03-21 ENCOUNTER — Ambulatory Visit: Payer: Medicare Other | Admitting: Physical Therapy

## 2021-03-21 ENCOUNTER — Other Ambulatory Visit: Payer: Self-pay

## 2021-03-21 DIAGNOSIS — R293 Abnormal posture: Secondary | ICD-10-CM

## 2021-03-21 DIAGNOSIS — M542 Cervicalgia: Secondary | ICD-10-CM | POA: Diagnosis not present

## 2021-03-21 DIAGNOSIS — M6281 Muscle weakness (generalized): Secondary | ICD-10-CM

## 2021-03-21 NOTE — Therapy (Signed)
[place], [place], [address] Phone: [phone]   Fax:  [phone]  Physical Therapy Treatment  Patient Details  Name: MARESA MORASH MRN: [id number] Date of Birth: [birth year] Referring Provider (PT): Dr. Pricilla Holm   Encounter Date: 03/21/2021   PT End of Session - 03/21/21 1130     Visit Number 7    Number of Visits 12    Date for PT Re-Evaluation 04/05/21    Authorization Type UHC MCR    PT Start Time 0174    PT Stop Time 1150    PT Time Calculation (min) 48 min             Past Medical History:  Diagnosis Date   ALLERGIC RHINITIS    Back pain    Dense breast    DEPRESSION    Fibroid    GERD    Hearing loss    HYPERLIPIDEMIA    diet control efforts   Osteoarthritis    Osteoporosis    PMB (postmenopausal bleeding) 12/2001   polyps   Serum calcium elevated 2021   sees Dr.Ellison/pulmonology with [place]   Thyroid disease    hypothyroidism   URINARY INCONTINENCE    OAB   Wisdom teeth removed 1866/1967    Past Surgical History:  Procedure Laterality Date   BREAST CYST ASPIRATION  12/05/2001   CATARACT EXTRACTION Bilateral 07/2012, 09/2012   DILATION AND CURETTAGE OF UTERUS  2003   HYSTEROSCOPY  01/2002   resection, sm fibroid   Left knee surgey     MENISCUS REPAIR     left knee 2010   REFRACTIVE SURGERY  02/2016   WISDOM TOOTH EXTRACTION      There were no vitals filed for this visit.   Subjective Assessment - 03/21/21 1105     Subjective L shoulder sore for some reason, about 5/10 it kept me awake.  The neck is the usual stiffness.    Currently in Pain? Yes    Pain Score 5     Pain Location Shoulder    Pain Orientation Left;Anterior    Pain Descriptors / Indicators Aching    Pain Type Chronic pain    Pain Onset In the past 7 days    Pain Frequency Rarely    Aggravating Factors  not sure whyre    Pain Relieving Factors rest,aleve    Multiple Pain Sites Yes     Pain Score 3    Pain Location Neck    Pain Orientation Right;Left;Posterior                [facility] PT Assessment - 03/21/21 0001       AROM   Cervical Flexion 50    Cervical Extension 50    Cervical - Right Side Bend 25    Cervical - Left Side Bend 30   pain on L   Cervical - Right Rotation 45    Cervical - Left Rotation 65      Strength   Right Shoulder Flexion 3+/5    Right Shoulder ABduction 3+/5    Left Shoulder Flexion 3+/5    Left Shoulder ABduction 3+/5               [facility] Adult PT Treatment/Exercise - 03/21/21 0001       Neck Exercises: Stabilization   Stabilization chin tuck x 10, scapular retraction x 10      Shoulder Exercises: Supine   Horizontal ABduction Theraband;15 reps  Theraband Level (Shoulder Horizontal ABduction) Level 2 (Red)    External Rotation Strengthening;Both;15 reps;Theraband    Theraband Level (Shoulder External Rotation) Level 2 (Red)    Flexion 10 reps    Theraband Level (Shoulder Flexion) Level 2 (Red)    Diagonals Both;10 reps    Theraband Level (Shoulder Diagonals) Level 2 (Red)      Shoulder Exercises: ROM/Strengthening   UBE (Upper Arm Bike) L1 , 3 min forward and 3 min back            Sit to stand to bicep curl to overhead press , 2 lb x 10  Blunted HRR at 80 bpm post.  02 sats 97% Tired and "panting " per patient         PT Education - 03/21/21 1130     Education Details ROM improvements and progress    Person(s) Educated Patient    Methods Explanation    Comprehension Verbalized understanding;Returned demonstration                 PT Long Term Goals - 03/16/21 1113       PT LONG TERM GOAL #1   Title FOTO score will improve to 55% or better to demo greater functional mobility    Baseline 51%    Status On-going      PT LONG TERM GOAL #2   Title pt will be I with HEP for posture, body mechanics, cervical stabliization and strength    Status On-going      PT LONG TERM GOAL #3    Title UE strength will improve to 4+/5 or more in shoulder flexion , elbow flexion for more effective lifting and neck support    Status On-going      PT LONG TERM GOAL #4   Title pt will be able to do laundry, housework without increased neck pain    Status On-going      PT LONG TERM GOAL #5   Title Pt will be able to improve sit to stand time to 10 sec without UEs to demo improved functional mobility    Status Unable to assess                   Plan - 03/21/21 1116     Clinical Impression Statement Patient has shown increased cervical ROM but cont to have decreased functional endurance and UE strength. She may join a community fitness center but has to coordinate her husband's care. She will continue to benefit from skilled PT to improve posture and mobility .    PT Treatment/Interventions ADLs/Self Care Home Management;Functional mobility training;Taping;Therapeutic activities;Therapeutic exercise;Balance training;Patient/family education;Passive range of motion;Manual techniques;Dry needling;Moist Heat;Cryotherapy    PT Next Visit Plan endurance, 5 x STS,wall for posture. manual therapy as needed, continue cervical and periscapular strengthening. included lower body and core    PT Home Exercise Plan [id number]    Consulted and Agree with Plan of Care Patient             Patient will benefit from skilled therapeutic intervention in order to improve the following deficits and impairments:  Decreased activity tolerance, Decreased strength, Increased fascial restricitons, Impaired flexibility, Impaired UE functional use, Pain, Postural dysfunction, Decreased range of motion, Decreased mobility, Decreased balance, Improper body mechanics  Visit Diagnosis: Cervicalgia  Abnormal posture  Muscle weakness (generalized)     Problem List Patient Active Problem List   Diagnosis Date Noted   Cervical radiculopathy 02/03/2021   Basal cell carcinoma (BCC)  of lateral side wall  of nose 11/09/2020   Hypercalcemia 04/20/2020   Numbness 04/19/2020   Left leg pain 07/08/2018   Sacral back pain 04/16/2018   Lung nodule 02/19/2017   Osteoporosis 08/09/2016   Hearing loss 02/21/2016   White matter abnormality on MRI of brain 07/11/2015   MCI (mild cognitive impairment) 07/11/2015   Routine general medical examination at a health care facility 02/11/2015   OAB (overactive bladder)    GERD 09/23/2007   Perennial allergic rhinitis with seasonal variation 09/22/2007    Brylyn Novakovich 03/21/2021, 12:48 PM  [facility] [address] [place], [place], [address] Phone: [phone]   Fax:  [phone]  Name: LILYONA RICHNER MRN: [id number] Date of Birth: 1945-11-10   Raeford Razor, PT 03/21/21 12:48 PM Phone: [phone] Fax: [phone]

## 2021-03-23 ENCOUNTER — Other Ambulatory Visit: Payer: Self-pay

## 2021-03-23 ENCOUNTER — Ambulatory Visit: Payer: Medicare Other | Admitting: Physical Therapy

## 2021-03-23 DIAGNOSIS — M6281 Muscle weakness (generalized): Secondary | ICD-10-CM

## 2021-03-23 DIAGNOSIS — R293 Abnormal posture: Secondary | ICD-10-CM

## 2021-03-23 DIAGNOSIS — M542 Cervicalgia: Secondary | ICD-10-CM

## 2021-03-23 NOTE — Therapy (Signed)
[place], [place], [address] Phone: [phone]   Fax:  [phone]  Physical Therapy Treatment  Patient Details  Name: Mary Malone MRN: [id number] Date of Birth: 16-Sep-1945 Referring Provider (PT): Dr. Pricilla Holm   Encounter Date: 03/23/2021   PT End of Session - 03/23/21 1120     Visit Number 8    Number of Visits 12    Date for PT Re-Evaluation 04/05/21    Authorization Type UHC MCR    PT Start Time 5329    PT Stop Time 1201    PT Time Calculation (min) 56 min             Past Medical History:  Diagnosis Date   ALLERGIC RHINITIS    Back pain    Dense breast    DEPRESSION    Fibroid    GERD    Hearing loss    HYPERLIPIDEMIA    diet control efforts   Osteoarthritis    Osteoporosis    PMB (postmenopausal bleeding) 12/2001   polyps   Serum calcium elevated 2021   sees Dr.Ellison/pulmonology with [place]   Thyroid disease    hypothyroidism   URINARY INCONTINENCE    OAB   Wisdom teeth removed 1866/1967    Past Surgical History:  Procedure Laterality Date   BREAST CYST ASPIRATION  12/05/2001   CATARACT EXTRACTION Bilateral 07/2012, 09/2012   DILATION AND CURETTAGE OF UTERUS  2003   HYSTEROSCOPY  01/2002   resection, sm fibroid   Left knee surgey     MENISCUS REPAIR     left knee 2010   REFRACTIVE SURGERY  02/2016   WISDOM TOOTH EXTRACTION      There were no vitals filed for this visit.           [facility] Adult PT Treatment/Exercise - 03/23/21 0001       Shoulder Exercises: Standing   Extension 10 reps;Both;Strengthening    Theraband Level (Shoulder Extension) Level 2 (Red)    Row Strengthening;Both;15 reps;Theraband    Theraband Level (Shoulder Row) Level 3 (Green)    Diagonals 10 reps;Both;Strengthening    Theraband Level (Shoulder Diagonals) Level 2 (Red)    Other Standing Exercises single arm pull red band x 10, pain in L shoulder      Shoulder  Exercises: ROM/Strengthening   UBE (Upper Arm Bike) 2 min FW and 2 min back L 1    Wall Pushups 10 reps    "W" Arms x 10    Other ROM/Strengthening Exercises goal post horizontal abd      Moist Heat Therapy   Number Minutes Moist Heat 10 Minutes    Moist Heat Location Shoulder;Cervical   L side     Manual Therapy   Manual therapy comments light suboccipital release and XFF along occipitals    Soft tissue mobilization post cervicals bilateral, L suboccipitals painful and medial scap border L side > R      Neck Exercises: Stretches   Upper Trapezius Stretch 2 reps;30 seconds    Levator Stretch 2 reps;30 seconds    Corner Stretch 30 seconds;2 reps                    PT Education - 03/23/21 1139     Education Details shoulder positons, chin tuck, finish POC    Person(s) Educated Patient    Methods Explanation    Comprehension Verbalized understanding  PT Long Term Goals - 03/16/21 1113       PT LONG TERM GOAL #1   Title FOTO score will improve to 55% or better to demo greater functional mobility    Baseline 51%    Status On-going      PT LONG TERM GOAL #2   Title pt will be I with HEP for posture, body mechanics, cervical stabliization and strength    Status On-going      PT LONG TERM GOAL #3   Title UE strength will improve to 4+/5 or more in shoulder flexion , elbow flexion for more effective lifting and neck support    Status On-going      PT LONG TERM GOAL #4   Title pt will be able to do laundry, housework without increased neck pain    Status On-going      PT LONG TERM GOAL #5   Title Pt will be able to improve sit to stand time to 10 sec without UEs to demo improved functional mobility    Status Unable to assess                   Plan - 03/23/21 1131     Clinical Impression Statement Focus on strength today with proper form.  Pt with mild increase in L shoulder pain with exercises.  Cues for neck and upper back when  standing against the wall.  Cont POC. No complain of neck pain today.    PT Treatment/Interventions ADLs/Self Care Home Management;Functional mobility training;Taping;Therapeutic activities;Therapeutic exercise;Balance training;Patient/family education;Passive range of motion;Manual techniques;Dry needling;Moist Heat;Cryotherapy    PT Next Visit Plan endurance, 5 x STS and integrate UE ,wall for posture. manual therapy as needed, continue cervical and periscapular strengthening. included lower body and core    PT Home Exercise Plan [id number]    Consulted and Agree with Plan of Care Patient             Patient will benefit from skilled therapeutic intervention in order to improve the following deficits and impairments:  Decreased activity tolerance, Decreased strength, Increased fascial restricitons, Impaired flexibility, Impaired UE functional use, Pain, Postural dysfunction, Decreased range of motion, Decreased mobility, Decreased balance, Improper body mechanics  Visit Diagnosis: Cervicalgia  Abnormal posture  Muscle weakness (generalized)     Problem List Patient Active Problem List   Diagnosis Date Noted   Cervical radiculopathy 02/03/2021   Basal cell carcinoma (BCC) of lateral side wall of nose 11/09/2020   Hypercalcemia 04/20/2020   Numbness 04/19/2020   Left leg pain 07/08/2018   Sacral back pain 04/16/2018   Lung nodule 02/19/2017   Osteoporosis 08/09/2016   Hearing loss 02/21/2016   White matter abnormality on MRI of brain 07/11/2015   MCI (mild cognitive impairment) 07/11/2015   Routine general medical examination at a health care facility 02/11/2015   OAB (overactive bladder)    GERD 09/23/2007   Perennial allergic rhinitis with seasonal variation 09/22/2007    Juliya Magill 03/23/2021, 5:19 PM  [facility] [address] [place], [place], [address] Phone: [phone]   Fax:  [phone]  Name: TUANA HOHEISEL MRN: [id number] Date of Birth: 04-27-1946  Raeford Razor, PT 03/23/21 5:19 PM Phone: [phone] Fax: [phone]

## 2021-03-28 ENCOUNTER — Other Ambulatory Visit: Payer: Self-pay

## 2021-03-28 ENCOUNTER — Ambulatory Visit: Payer: Medicare Other

## 2021-03-28 DIAGNOSIS — M6281 Muscle weakness (generalized): Secondary | ICD-10-CM

## 2021-03-28 DIAGNOSIS — R293 Abnormal posture: Secondary | ICD-10-CM

## 2021-03-28 DIAGNOSIS — M542 Cervicalgia: Secondary | ICD-10-CM | POA: Diagnosis not present

## 2021-03-28 NOTE — Therapy (Signed)
[place], [place], [address] Phone: [phone]   Fax:  [phone]  Physical Therapy Treatment  Patient Details  Name: Mary Malone MRN: [id number] Date of Birth: Jan 17, 1946 Referring Provider (PT): Dr. Pricilla Holm   Encounter Date: 03/28/2021   PT End of Session - 03/28/21 1148     Visit Number 9    Number of Visits 12    Date for PT Re-Evaluation 04/05/21    Authorization Type UHC MCR    PT Start Time [id number]    PT Stop Time 1228    PT Time Calculation (min) 40 min    Activity Tolerance Patient tolerated treatment well    Behavior During Therapy [facility] for tasks assessed/performed             Past Medical History:  Diagnosis Date   ALLERGIC RHINITIS    Back pain    Dense breast    DEPRESSION    Fibroid    GERD    Hearing loss    HYPERLIPIDEMIA    diet control efforts   Osteoarthritis    Osteoporosis    PMB (postmenopausal bleeding) 12/2001   polyps   Serum calcium elevated 2021   sees Dr.Ellison/pulmonology with [place]   Thyroid disease    hypothyroidism   URINARY INCONTINENCE    OAB   Wisdom teeth removed 1866/1967    Past Surgical History:  Procedure Laterality Date   BREAST CYST ASPIRATION  12/05/2001   CATARACT EXTRACTION Bilateral 07/2012, 09/2012   DILATION AND CURETTAGE OF UTERUS  2003   HYSTEROSCOPY  01/2002   resection, sm fibroid   Left knee surgey     MENISCUS REPAIR     left knee 2010   REFRACTIVE SURGERY  02/2016   WISDOM TOOTH EXTRACTION      There were no vitals filed for this visit.   Subjective Assessment - 03/28/21 1151     Subjective Patient reports that her neck is improving, but her shoulder isn't progressing the way she wants it to. Her left knee was hurting after the last session and is still sore today.    Currently in Pain? Yes    Pain Score 2     Pain Location Knee    Pain Orientation Left    Pain Descriptors / Indicators Sore    Pain  Type Acute pain    Pain Onset In the past 7 days    Pain Frequency Constant                               [facility] Adult PT Treatment/Exercise - 03/28/21 0001       Self-Care   Self-Care Other Self-Care Comments    Other Self-Care Comments  recommendation for handles for resistance bands, recommendation on supportive shoes and where to purchase      Shoulder Exercises: Supine   Protraction 10 reps    Protraction Weight (lbs) 2    Protraction Limitations x2      Shoulder Exercises: Prone   Retraction 10 reps    Retraction Limitations x2    Other Prone Exercises prone W 2 x 10      Shoulder Exercises: Standing   Flexion 10 reps    Shoulder Flexion Weight (lbs) 2    Flexion Limitations x2    Other Standing Exercises scaption 2 x 10; 2 lb      Shoulder Exercises: ROM/Strengthening  UBE (Upper Arm Bike) 2 min FW and 2 min back L 1      Manual Therapy   Manual therapy comments suboccipital release    Soft tissue mobilization Lt upper trap, levator scap, posterior rotator cuff                    PT Education - 03/28/21 1239     Education Details See self care    Person(s) Educated Patient    Methods Explanation    Comprehension Verbalized understanding                 PT Long Term Goals - 03/16/21 1113       PT LONG TERM GOAL #1   Title FOTO score will improve to 55% or better to demo greater functional mobility    Baseline 51%    Status On-going      PT LONG TERM GOAL #2   Title pt will be I with HEP for posture, body mechanics, cervical stabliization and strength    Status On-going      PT LONG TERM GOAL #3   Title UE strength will improve to 4+/5 or more in shoulder flexion , elbow flexion for more effective lifting and neck support    Status On-going      PT LONG TERM GOAL #4   Title pt will be able to do laundry, housework without increased neck pain    Status On-going      PT LONG TERM GOAL #5   Title Pt will be  able to improve sit to stand time to 10 sec without UEs to demo improved functional mobility    Status Unable to assess                   Plan - 03/28/21 1152     Clinical Impression Statement Tautness and palpable tenderness present about Lt infraspinatus with partial release from manual therapy. Able to introduce prone periscapular strengthening with minimal postural  cues required with patient quickly fatiguing with prone W. Able to progress shoulder strengthening without reports of pain.    PT Treatment/Interventions ADLs/Self Care Home Management;Functional mobility training;Taping;Therapeutic activities;Therapeutic exercise;Balance training;Patient/family education;Passive range of motion;Manual techniques;Dry needling;Moist Heat;Cryotherapy    PT Next Visit Plan re-eval    PT Home Exercise Plan [id number]    Consulted and Agree with Plan of Care Patient             Patient will benefit from skilled therapeutic intervention in order to improve the following deficits and impairments:  Decreased activity tolerance, Decreased strength, Increased fascial restricitons, Impaired flexibility, Impaired UE functional use, Pain, Postural dysfunction, Decreased range of motion, Decreased mobility, Decreased balance, Improper body mechanics  Visit Diagnosis: Cervicalgia  Abnormal posture  Muscle weakness (generalized)     Problem List Patient Active Problem List   Diagnosis Date Noted   Cervical radiculopathy 02/03/2021   Basal cell carcinoma (BCC) of lateral side wall of nose 11/09/2020   Hypercalcemia 04/20/2020   Numbness 04/19/2020   Left leg pain 07/08/2018   Sacral back pain 04/16/2018   Lung nodule 02/19/2017   Osteoporosis 08/09/2016   Hearing loss 02/21/2016   White matter abnormality on MRI of brain 07/11/2015   MCI (mild cognitive impairment) 07/11/2015   Routine general medical examination at a health care facility 02/11/2015   OAB (overactive bladder)     GERD 09/23/2007   Perennial allergic rhinitis with seasonal variation 09/22/2007   Kaylene Dawn,  PT, DPT, ATC 03/28/21 12:41 PM   [facility] [address] [place], [place], [address] Phone: [phone]   Fax:  [phone]  Name: AVIANCE LANDRON MRN: [id number] Date of Birth: 1945-09-30

## 2021-03-30 ENCOUNTER — Ambulatory Visit: Payer: Medicare Other

## 2021-03-30 ENCOUNTER — Other Ambulatory Visit: Payer: Self-pay

## 2021-03-30 DIAGNOSIS — R293 Abnormal posture: Secondary | ICD-10-CM

## 2021-03-30 DIAGNOSIS — M6281 Muscle weakness (generalized): Secondary | ICD-10-CM

## 2021-03-30 DIAGNOSIS — M542 Cervicalgia: Secondary | ICD-10-CM

## 2021-03-30 NOTE — Therapy (Signed)
[place], [place], [address] Phone: [phone]   Fax:  [phone]  Physical Therapy Treatment/Progress Note/Discharge Progress Note Reporting Period 02/22/21 to 03/30/21  See note below for Objective Data and Assessment of Progress/Goals.      Patient Details  Name: Mary Malone MRN: [id number] Date of Birth: 1945/09/15 Referring Provider (PT): Dr. Pricilla Holm   Encounter Date: 03/30/2021   PT End of Session - 03/30/21 1100     Visit Number 10    Number of Visits 12    Date for PT Re-Evaluation 04/05/21    Authorization Type UHC MCR    PT Start Time 1100    PT Stop Time 0600    PT Time Calculation (min) 42 min    Activity Tolerance Patient tolerated treatment well    Behavior During Therapy [facility] for tasks assessed/performed             Past Medical History:  Diagnosis Date   ALLERGIC RHINITIS    Back pain    Dense breast    DEPRESSION    Fibroid    GERD    Hearing loss    HYPERLIPIDEMIA    diet control efforts   Osteoarthritis    Osteoporosis    PMB (postmenopausal bleeding) 12/2001   polyps   Serum calcium elevated 2021   sees Dr.Ellison/pulmonology with [place]   Thyroid disease    hypothyroidism   URINARY INCONTINENCE    OAB   Wisdom teeth removed 1866/1967    Past Surgical History:  Procedure Laterality Date   BREAST CYST ASPIRATION  12/05/2001   CATARACT EXTRACTION Bilateral 07/2012, 09/2012   DILATION AND CURETTAGE OF UTERUS  2003   HYSTEROSCOPY  01/2002   resection, sm fibroid   Left knee surgey     MENISCUS REPAIR     left knee 2010   REFRACTIVE SURGERY  02/2016   WISDOM TOOTH EXTRACTION      There were no vitals filed for this visit.   Subjective Assessment - 03/30/21 1102     Subjective Patient reports the neck is much better since starting PT. She reports not really having any issues with her neck with household activities, but is having left knee  pain related to her arthritis. She feels that she is ready for discharge and can continue with her exercises independently.    Currently in Pain? No/denies    Pain Onset --                [facility] PT Assessment - 03/30/21 0001       Assessment   Medical Diagnosis cervical radiculopathy    Referring Provider (PT) Dr. Pricilla Holm      Observation/Other Assessments   Focus on Therapeutic Outcomes (FOTO)  65%      AROM   Cervical Flexion 51    Cervical Extension 50    Cervical - Right Side Bend 25    Cervical - Left Side Bend 30    Cervical - Right Rotation 60    Cervical - Left Rotation 70      Strength   Right Shoulder Flexion 4/5    Right Shoulder ABduction 4/5    Left Shoulder Flexion 4/5    Left Shoulder ABduction 4/5    Right Elbow Flexion 5/5    Left Elbow Flexion 5/5    Right Hip Flexion 4/5    Left Hip Flexion 4/5      Transfers  Five time sit to stand comments  12.4 sec                           [facility] Adult PT Treatment/Exercise - 03/30/21 0001       Self-Care   Self-Care Other Self-Care Comments    Other Self-Care Comments  see patient education      Knee/Hip Exercises: Seated   Long Arc Quad 10 reps    Other Seated Knee/Hip Exercises hamstring stretch 30 sec bilaterally    Marching 10 reps    Abduction/Adduction  10 reps    Abd/Adduction Limitations blue band                    PT Education - 03/30/21 1112     Education Details Education on overall functional progress, d/c education, FOTO score, reviewed/updated HEP, issed tennis ball for self soft tissue mobilization, discussed use of theracane and where to purcase. Recommended to f/u with PCP regarding ongoing knee pain.    Person(s) Educated Patient    Methods Explanation;Demonstration;Verbal cues;Handout    Comprehension Verbalized understanding;Returned demonstration                 PT Long Term Goals - 03/30/21 1103       PT LONG TERM GOAL #1    Title FOTO score will improve to 55% or better to demo greater functional mobility    Baseline 65%    Status Achieved      PT LONG TERM GOAL #2   Title pt will be I with HEP for posture, body mechanics, cervical stabliization and strength    Status Achieved      PT LONG TERM GOAL #3   Title UE strength will improve to 4+/5 or more in shoulder flexion , elbow flexion for more effective lifting and neck support    Status Partially Met      PT LONG TERM GOAL #4   Title pt will be able to do laundry, housework without increased neck pain    Baseline no issues regarding her neck, some knee pain    Status Achieved      PT LONG TERM GOAL #5   Title Pt will be able to improve sit to stand time to 10 sec without UEs to demo improved functional mobility    Baseline 12    Status Not Met                   Plan - 03/30/21 1111     Clinical Impression Statement Patient has progressed well since start of care reporting overall improvements in her neck pain. She demonstrates improved cervical AROM and BUE strength since start of care. She has met the majority of her functional goals and is pleased with her overall functional progress. She demonstrates independence with her home program and is appropriate for discharge at this time.    PT Treatment/Interventions ADLs/Self Care Home Management;Functional mobility training;Taping;Therapeutic activities;Therapeutic exercise;Balance training;Patient/family education;Passive range of motion;Manual techniques;Dry needling;Moist Heat;Cryotherapy    PT Next Visit Plan --    PT Home Exercise Plan [facility]    Recommended Other Services f/u with PCP regarding Lt knee pain    Consulted and Agree with Plan of Care Patient             Patient will benefit from skilled therapeutic intervention in order to improve the following deficits and impairments:  Decreased activity tolerance, Decreased strength, Increased  fascial restricitons, Impaired  flexibility, Impaired UE functional use, Pain, Postural dysfunction, Decreased range of motion, Decreased mobility, Decreased balance, Improper body mechanics  Visit Diagnosis: Cervicalgia  Abnormal posture  Muscle weakness (generalized)     Problem List Patient Active Problem List   Diagnosis Date Noted   Cervical radiculopathy 02/03/2021   Basal cell carcinoma (BCC) of lateral side wall of nose 11/09/2020   Hypercalcemia 04/20/2020   Numbness 04/19/2020   Left leg pain 07/08/2018   Sacral back pain 04/16/2018   Lung nodule 02/19/2017   Osteoporosis 08/09/2016   Hearing loss 02/21/2016   White matter abnormality on MRI of brain 07/11/2015   MCI (mild cognitive impairment) 07/11/2015   Routine general medical examination at a health care facility 02/11/2015   OAB (overactive bladder)    GERD 09/23/2007   Perennial allergic rhinitis with seasonal variation 09/22/2007  PHYSICAL THERAPY DISCHARGE SUMMARY  Visits from Start of Care: 10  Current functional level related to goals / functional outcomes:  See goals above Remaining deficits: See above   Education / Equipment: See above   Patient agrees to discharge. Patient goals were partially met. Patient is being discharged due to being pleased with the current functional level.  Gwendolyn Grant, PT, DPT, ATC 03/30/21 2:34 PM  [facility] [address] [place], [place], [address] Phone: [phone]   Fax:  [phone]  Name: Mary Malone MRN: [id number] Date of Birth: Sep 10, 1945

## 2021-04-06 ENCOUNTER — Encounter: Payer: Medicare Other | Admitting: Physical Therapy

## 2021-04-11 ENCOUNTER — Encounter: Payer: Medicare Other | Admitting: Physical Therapy

## 2021-04-19 ENCOUNTER — Ambulatory Visit: Payer: Medicare Other | Admitting: Endocrinology

## 2021-06-14 ENCOUNTER — Other Ambulatory Visit (HOSPITAL_COMMUNITY)
Admission: RE | Admit: 2021-06-14 | Discharge: 2021-06-14 | Disposition: A | Discharge status: Home / Self Care | Payer: Medicare Other | Source: Ambulatory Visit | Attending: Obstetrics and Gynecology | Admitting: Obstetrics and Gynecology

## 2021-06-14 ENCOUNTER — Other Ambulatory Visit: Payer: Self-pay

## 2021-06-14 ENCOUNTER — Ambulatory Visit (INDEPENDENT_AMBULATORY_CARE_PROVIDER_SITE_OTHER): Payer: Medicare Other | Admitting: Obstetrics and Gynecology

## 2021-06-14 ENCOUNTER — Encounter: Payer: Self-pay | Admitting: Obstetrics and Gynecology

## 2021-06-14 VITALS — BP 138/68 | HR 74 | Ht 65.0 in | Wt 134.0 lb

## 2021-06-14 DIAGNOSIS — Z01419 Encounter for gynecological examination (general) (routine) without abnormal findings: Secondary | ICD-10-CM | POA: Diagnosis not present

## 2021-06-14 DIAGNOSIS — Z1151 Encounter for screening for human papillomavirus (HPV): Secondary | ICD-10-CM | POA: Insufficient documentation

## 2021-06-14 DIAGNOSIS — N952 Postmenopausal atrophic vaginitis: Secondary | ICD-10-CM

## 2021-06-14 DIAGNOSIS — Z124 Encounter for screening for malignant neoplasm of cervix: Secondary | ICD-10-CM | POA: Insufficient documentation

## 2021-06-14 MED ORDER — PREMARIN 0.625 MG/GM VA CREA: TOPICAL_CREAM | VAGINAL | 1 refills | Status: DC

## 2021-06-14 NOTE — Patient Instructions (Signed)

## 2021-06-14 NOTE — Progress Notes (Signed)
GYNECOLOGY  VISIT   HPI: 75 y.o.   Married  Caucasian  female   G0P0 with Patient's last menstrual period was 09/03/1996.   here for breast and pelvic exam.   Not voiding often, so she is having accidents of urinary incontinence.  She did pelvic floor PT at [facility] and feels she needs to remember to do her exercises. Denies dysuria.   Using Premarin cream 1/2 gram every 4 days.  No vaginal irritation.  Not sexually active.   Denies vaginal bleeding.   Has had flu vaccine.  Doing BMD tomorrow.   Husband's Alzheimer's disease is progressing.  Husband had HPV related head and neck cancer.   GYNECOLOGIC HISTORY: Patient's last menstrual period was 09/03/1996. Contraception:  PMP Menopausal hormone therapy:  Premarin cream Last mammogram: 06-17-20 3D/Neg/Birads1 Last pap smear:  06-08-20 Neg, 05-29-18 Neg, 05/10/16 Neg. Hx abnormal pap:  Yes,04-19-14 ASCUS:Neg HR HPV, Hx of Pos HPV in 2002?        OB History     Gravida  0   Para      Term      Preterm      AB      Living         SAB      IAB      Ectopic      Multiple      Live Births                 Patient Active Problem List   Diagnosis Date Noted   Cervical radiculopathy 02/03/2021   Basal cell carcinoma (BCC) of lateral side wall of nose 11/09/2020   Hypercalcemia 04/20/2020   Numbness 04/19/2020   Left leg pain 07/08/2018   Sacral back pain 04/16/2018   Lung nodule 02/19/2017   Osteoporosis 08/09/2016   Hearing loss 02/21/2016   White matter abnormality on MRI of brain 07/11/2015   MCI (mild cognitive impairment) 07/11/2015   Routine general medical examination at a health care facility 02/11/2015   OAB (overactive bladder)    GERD 09/23/2007   Perennial allergic rhinitis with seasonal variation 09/22/2007    Past Medical History:  Diagnosis Date   ALLERGIC RHINITIS    Back pain    Cancer ([place]) 12/02/2020   basal cell of nose   Dense breast    DEPRESSION    Fibroid     GERD    Hearing loss    History of radiation therapy 12/02/2020   for basal cell carcinoma of nose   HYPERLIPIDEMIA    diet control efforts   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   Osteoporosis    PMB (postmenopausal bleeding) 12/2001   polyps   Serum calcium elevated 2021   sees Dr.Ellison/pulmonology with [place]   Thyroid disease    hypothyroidism   URINARY INCONTINENCE    OAB   Wisdom teeth removed 1866/1967    Past Surgical History:  Procedure Laterality Date   BREAST CYST ASPIRATION  12/05/2001   CATARACT EXTRACTION Bilateral 07/2012, 09/2012   DILATION AND CURETTAGE OF UTERUS  2003   HYSTEROSCOPY  01/2002   resection, sm fibroid   Left knee surgey     MENISCUS REPAIR     left knee 2010   REFRACTIVE SURGERY  02/2016   WISDOM TOOTH EXTRACTION      Current Outpatient Medications  Medication Sig Dispense Refill   Azelastine-Fluticasone 137-50 MCG/ACT SUSP 1-2 puffs each nostril twice daily as needed 23 g 12  Calcium Citrate-Vitamin D (CITRACAL + D PO) Take 1,200 mg by mouth daily.     Cholecalciferol (VITAMIN D3) 2000 units TABS Take 1 tablet by mouth daily.     conjugated estrogens (PREMARIN) vaginal cream USE 0.5 GRAM VAGINALLY 2 TIMES A WEEK 30 g 1   famotidine (PEPCID) 10 MG tablet Take 10 mg by mouth daily.     levothyroxine (SYNTHROID) 25 MCG tablet TAKE 1 TABLET(25 MCG) BY MOUTH DAILY BEFORE BREAKFAST 90 tablet 3   Magnesium 250 MG TABS Take 250 mg by mouth daily.     montelukast (SINGULAIR) 10 MG tablet TAKE 1 TABLET(10 MG) BY MOUTH AT BEDTIME 90 tablet 4   Multiple Vitamins-Minerals (CENTRUM SILVER) tablet Take 1 tablet by mouth daily.     Omega-3 Fatty Acids (FISH OIL) 1200 MG CAPS Take by mouth daily.     PREVIDENT 5000 BOOSTER PLUS 1.1 % PSTE 3 (three) times daily as needed.     vitamin E 400 UNIT capsule Take 800 Units by mouth daily.      Zoledronic Acid (RECLAST IV) Inject into the vein. IV infusion yearly     cromolyn (NASALCROM) 5.2 MG/ACT nasal  spray Place 1 spray into both nostrils once for 1 dose. Taking once in the morning 06/19 26 mL 12   No current facility-administered medications for this visit.     ALLERGIES: Patient has no known allergies.  Family History  Problem Relation Age of Onset   Alzheimer's disease Mother    Allergies Mother    Breast cancer Mother 75   Dementia Mother    Lung cancer Father 23   Allergies Brother    Diabetes Brother    Transient ischemic attack Maternal Grandmother    Peripheral Artery Disease Maternal Grandmother        s/p B leg amputation   Stroke Maternal Grandmother    Tuberculosis Maternal Grandfather    Diabetes Brother 54   Alzheimer's disease Brother 72       early onset   Sudden death Paternal Grandmother 81       unknown   Breast cancer Paternal Aunt    Osteoporosis Neg Hx     Social History   Socioeconomic History   Marital status: Married    Spouse name: Not on file   Number of children: Not on file   Years of education: Not on file   Highest education level: Not on file  Occupational History   Not on file  Tobacco Use   Smoking status: Never   Smokeless tobacco: Never  Vaping Use   Vaping Use: Never used  Substance and Sexual Activity   Alcohol use: No    Alcohol/week: 1.0 standard drink    Types: 1 Standard drinks or equivalent per week   Drug use: No   Sexual activity: Not Currently    Partners: Male    Birth control/protection: Post-menopausal  Other Topics Concern   Not on file  Social History Narrative   Not on file   Social Determinants of Health   Financial Resource Strain: Not on file  Food Insecurity: Not on file  Transportation Needs: No Transportation Needs   Lack of Transportation (Medical): No   Lack of Transportation (Non-Medical): No  Physical Activity: Not on file  Stress: Not on file  Social Connections: Not on file  Intimate Partner Violence: Not on file    Review of Systems  All other systems reviewed and are  negative.  PHYSICAL EXAMINATION:  BP 138/68   Pulse 74   Ht 5\' 5"  (1.651 m)   Wt 134 lb (60.8 kg)   LMP 09/03/1996   SpO2 98%   BMI 22.30 kg/m     General appearance: alert, cooperative and appears stated age Head: Normocephalic, without obvious abnormality, atraumatic Neck: no adenopathy, supple, symmetrical, trachea midline and thyroid normal to inspection and palpation Lungs: clear to auscultation bilaterally Breasts: normal appearance, no masses or tenderness, No nipple retraction or dimpling, No nipple discharge or bleeding, No axillary or supraclavicular adenopathy Heart: regular rate and rhythm Abdomen: soft, non-tender, no masses,  no organomegaly Extremities: extremities normal, atraumatic, no cyanosis or edema Skin: Skin color, texture, turgor normal. No rashes or lesions Lymph nodes: Cervical, supraclavicular, and axillary nodes normal. No abnormal inguinal nodes palpated Neurologic: Grossly normal  Pelvic: External genitalia:  no lesions              Urethra:  normal appearing urethra with no masses, tenderness or lesions              Bartholins and Skenes: normal                 Vagina: normal appearing vagina with normal color and discharge, no lesions              Cervix: no lesions                Bimanual Exam:  Uterus:  normal size, contour, position, consistency, mobility, non-tender              Adnexa: no mass, fullness, tenderness              Rectal exam: yes.  Confirms.              Anus:  normal sphincter tone, no lesions  Chaperone was present for exam:  Estill Bamberg, CMA  ASSESSMENT  Overactive bladder.   Off meds. Hx incomplete uterovaginal prolapse. Osteoporosis.  On Reclast.  Hypercalcemia. Compression fracture of thoracic/lumbar region. Vulvovaginal atrophy.  FH of breast cancer in mother. Caregiver status. Husband with HPV head and neck cancer.   PLAN  BMD tomorrow.  HR HPV with reflex pap.  Refill of Premarin vaginal cream. Potential  effect on breast cancer discussed.  Mammogram yearly.  SBE.  Labs with PCP. Fu yearly.   An After Visit Summary was printed and given to the patient.  35 min  total time was spent for this patient encounter, including preparation, face-to-face counseling with the patient, coordination of care, and documentation of the encounter.

## 2021-06-15 ENCOUNTER — Ambulatory Visit
Admission: RE | Admit: 2021-06-15 | Discharge: 2021-06-15 | Disposition: A | Discharge status: Home / Self Care | Payer: Medicare Other | Source: Ambulatory Visit | Attending: Internal Medicine | Admitting: Internal Medicine

## 2021-06-15 ENCOUNTER — Other Ambulatory Visit: Payer: Self-pay | Admitting: Internal Medicine

## 2021-06-15 DIAGNOSIS — M810 Age-related osteoporosis without current pathological fracture: Secondary | ICD-10-CM

## 2021-06-15 DIAGNOSIS — Z1231 Encounter for screening mammogram for malignant neoplasm of breast: Secondary | ICD-10-CM

## 2021-06-15 LAB — CERVICOVAGINAL ANCILLARY ONLY
Comment: NEGATIVE
High risk HPV: NEGATIVE

## 2021-07-05 NOTE — Progress Notes (Signed)
Patient Name: Mary Malone MRN: [id number] DOB: 06-28-1946 Referring Physician: Griselda Miner (Profile Not Attached) Date of Service: 12/30/2020 [facility], [place]  End Of Treatment Note Diagnoses: C44.311-Basal cell carcinoma of skin of nose  Cancer Staging: Cancer Staging Basal cell carcinoma (BCC) of lateral side wall of nose Staging form: Cutaneous Carcinoma of the Head and Neck, AJCC 8th Edition - Clinical stage from 11/09/2020: Stage I (cT1, cN0, cM0) - Signed by Eppie Gibson, MD on 11/09/2020 Stage prefix: Initial diagnosis Extraosseous extension: Absent  Intent: Curative  Radiation Treatment Dates: 12/05/2020 through 12/30/2020  She received 50 Gray in 20 fractions to the left nose using 6 MeV electrons  Narrative: The patient tolerated radiation therapy relatively well.   Plan: The patient will follow-up with radiation oncology in 62mo . -----------------------------------  Eppie Gibson, MD

## 2021-07-09 ENCOUNTER — Encounter: Payer: Self-pay | Admitting: Internal Medicine

## 2021-07-09 NOTE — Assessment & Plan Note (Signed)
Previously stable. Plan- ok to defer CXR as she requests

## 2021-07-09 NOTE — Assessment & Plan Note (Signed)
Irritation and some congestion recently c/w her XRT. Can use saline.

## 2021-07-13 ENCOUNTER — Ambulatory Visit: Payer: Medicare Other

## 2021-07-25 ENCOUNTER — Ambulatory Visit
Admission: RE | Admit: 2021-07-25 | Discharge: 2021-07-25 | Disposition: A | Discharge status: Home / Self Care | Payer: Medicare Other | Source: Ambulatory Visit | Attending: Internal Medicine | Admitting: Internal Medicine

## 2021-07-25 ENCOUNTER — Other Ambulatory Visit: Payer: Self-pay

## 2021-07-25 DIAGNOSIS — Z1231 Encounter for screening mammogram for malignant neoplasm of breast: Secondary | ICD-10-CM

## 2021-12-01 ENCOUNTER — Encounter: Payer: Self-pay | Admitting: Internal Medicine

## 2021-12-01 ENCOUNTER — Ambulatory Visit (INDEPENDENT_AMBULATORY_CARE_PROVIDER_SITE_OTHER): Payer: Medicare Other | Admitting: Internal Medicine

## 2021-12-01 DIAGNOSIS — J302 Other seasonal allergic rhinitis: Secondary | ICD-10-CM

## 2021-12-01 DIAGNOSIS — J3089 Other allergic rhinitis: Secondary | ICD-10-CM | POA: Diagnosis not present

## 2021-12-01 DIAGNOSIS — K219 Gastro-esophageal reflux disease without esophagitis: Secondary | ICD-10-CM | POA: Diagnosis not present

## 2021-12-01 DIAGNOSIS — M810 Age-related osteoporosis without current pathological fracture: Secondary | ICD-10-CM | POA: Diagnosis not present

## 2021-12-01 NOTE — Assessment & Plan Note (Signed)
Uses various nose sprays azelastine/fluticasone 1-2 puffs BID during allergy season and nasalcrom when needed. Overall decently controlled and will continue. ?

## 2021-12-01 NOTE — Assessment & Plan Note (Signed)
Gets reclast with endo.  ?

## 2021-12-01 NOTE — Patient Instructions (Signed)
If they need TB test ask about if they accept the blood test quantiferon tb test ? ? ?

## 2021-12-01 NOTE — Assessment & Plan Note (Signed)
Well controlled with pepcid 10 mg daily and will continue. ?

## 2021-12-01 NOTE — Progress Notes (Signed)
? ?  Subjective:  ? ?Patient ID: Mary Malone, female    DOB: [birth year], 76 y.o.   MRN: [id number] ? ?HPI ?Needs paperwork filled out for [facility] home independent.  ? ?Review of Systems  ?Constitutional: Negative.   ?HENT: Negative.    ?Eyes: Negative.   ?Respiratory:  Negative for cough, chest tightness and shortness of breath.   ?Cardiovascular:  Negative for chest pain, palpitations and leg swelling.  ?Gastrointestinal:  Negative for abdominal distention, abdominal pain, constipation, diarrhea, nausea and vomiting.  ?Musculoskeletal: Negative.   ?Skin: Negative.   ?Neurological: Negative.   ?Psychiatric/Behavioral: Negative.    ? ?Objective:  ?Physical Exam ?Constitutional:   ?   Appearance: She is well-developed.  ?HENT:  ?   Head: Normocephalic and atraumatic.  ?Cardiovascular:  ?   Rate and Rhythm: Normal rate and regular rhythm.  ?Pulmonary:  ?   Effort: Pulmonary effort is normal. No respiratory distress.  ?   Breath sounds: Normal breath sounds. No wheezing or rales.  ?Abdominal:  ?   General: Bowel sounds are normal. There is no distension.  ?   Palpations: Abdomen is soft.  ?   Tenderness: There is no abdominal tenderness. There is no rebound.  ?Musculoskeletal:  ?   Cervical back: Normal range of motion.  ?Skin: ?   General: Skin is warm and dry.  ?Neurological:  ?   Mental Status: She is alert and oriented to person, place, and time.  ?   Coordination: Coordination normal.  ? ? ?Vitals:  ? 12/01/21 1422  ?BP: 120/68  ?Pulse: 67  ?Resp: 18  ?SpO2: 97%  ?Weight: 130 lb (59 kg)  ?Height: '5\' 5"'$  (1.651 m)  ? ? ?This visit occurred during the SARS-CoV-2 public health emergency.  Safety protocols were in place, including screening questions prior to the visit, additional usage of staff PPE, and extensive cleaning of exam room while observing appropriate contact time as indicated for disinfecting solutions.  ? ?Assessment & Plan:  ?Visit time 15 minutes in face to face communication with patient and  coordination of care, additional 5 minutes spent in record review, coordination or care, ordering tests, communicating/referring to other healthcare professionals, documenting in medical records all on the same day of the visit for total time 20 minutes spent on the visit.  ? ?

## 2021-12-01 NOTE — Assessment & Plan Note (Signed)
Stable on recent labs. 

## 2022-01-05 ENCOUNTER — Other Ambulatory Visit: Payer: Self-pay

## 2022-01-09 ENCOUNTER — Telehealth: Payer: Self-pay | Admitting: Pharmacy Technician

## 2022-01-09 NOTE — Telephone Encounter (Signed)
Auth Submission: APPROVED ?Payer: [employer] ?Medication & CPT/J Code(s) submitted: Reclast (Zolendronic acid) J3489 ?Route of submission (phone, fax, portal): PORTAL ?Auth type: Buy/Bill ?Units/visits requested: '5MG'$  X 1 DOSE ?Reference number: [id number] ?Approval from: 01/09/22 to 01/10/23  ?Patient will be scheduled as soon as possible ?

## 2022-01-16 ENCOUNTER — Ambulatory Visit (INDEPENDENT_AMBULATORY_CARE_PROVIDER_SITE_OTHER): Payer: Medicare Other

## 2022-01-16 VITALS — BP 154/74 | HR 61 | Temp 98.4°F | Resp 18 | Ht 65.0 in | Wt 125.2 lb

## 2022-01-16 DIAGNOSIS — M810 Age-related osteoporosis without current pathological fracture: Secondary | ICD-10-CM

## 2022-01-16 MED ORDER — SODIUM CHLORIDE 0.9 % IV SOLN: INTRAVENOUS | Status: DC

## 2022-01-16 MED ORDER — ZOLEDRONIC ACID 5 MG/100ML IV SOLN
5.0000 mg | Freq: Once | INTRAVENOUS | Status: AC
  Administered 2022-01-16: 5 mg via INTRAVENOUS

## 2022-01-16 MED ORDER — DIPHENHYDRAMINE HCL 25 MG PO CAPS: 25.0000 mg | ORAL_CAPSULE | Freq: Once | ORAL | Status: DC

## 2022-01-16 MED ORDER — ACETAMINOPHEN 325 MG PO TABS: 650.0000 mg | ORAL_TABLET | Freq: Once | ORAL | Status: DC

## 2022-01-16 NOTE — Progress Notes (Addendum)
Diagnosis: osteoporosis  ? ?Provider:  Marshell Garfinkel, MD ? ?Procedure: Infusion ? ?IV Type: Peripheral, IV Location: L Antecubital ? ?Reclast (Zolendronic Acid), Dose: 5 mg ? ?Infusion Start Time: 1300 ? ?Infusion Stop Time: 8069 ? ?Post Infusion IV Care: Peripheral IV Discontinued ? ?Discharge: Condition: Good, Destination: Home . AVS provided to patient.  ? ?Performed by:  Adelina Mings, LPN  ?  ?

## 2022-02-12 ENCOUNTER — Ambulatory Visit (INDEPENDENT_AMBULATORY_CARE_PROVIDER_SITE_OTHER): Payer: Medicare Other

## 2022-02-12 DIAGNOSIS — Z0000 Encounter for general adult medical examination without abnormal findings: Secondary | ICD-10-CM | POA: Diagnosis not present

## 2022-02-12 NOTE — Progress Notes (Signed)
I connected with Mary Malone today by telephone and verified that I am speaking with the correct person using two identifiers. Location patient: home Location provider: work Persons participating in the virtual visit: patient, provider.   I discussed the limitations, risks, security and privacy concerns of performing an evaluation and management service by telephone and the availability of in person appointments. I also discussed with the patient that there may be a patient responsible charge related to this service. The patient expressed understanding and verbally consented to this telephonic visit.    Interactive audio and video telecommunications were attempted between this provider and patient, however failed, due to patient having technical difficulties OR patient did not have access to video capability.  We continued and completed visit with audio only.  Some vital signs may be absent or patient reported.   Time Spent with patient on telephone encounter: 30 minutes  Subjective:   Mary Malone is a 76 y.o. female who presents for Medicare Annual (Subsequent) preventive examination.  Review of Systems     Cardiac Risk Factors include: advanced age (>33mn, >>15women);family history of premature cardiovascular disease     Objective:    There were no vitals filed for this visit. There is no height or weight on file to calculate BMI.     02/12/2022    3:06 PM 02/01/2021   11:53 AM 11/09/2020    9:47 AM 06/18/2018    9:16 AM 02/17/2015    2:09 PM  Advanced Directives  Does Patient Have a Medical Advance Directive? Yes No Yes Yes Yes  Type of Advance Directive Living will;Healthcare Power of [place] will [place] will  Does patient want to make changes to medical advance directive? No - Patient declined  No - Patient declined    Copy of [place]? No  - copy requested  No - copy requested No - copy requested   Would patient like information on creating a medical advance directive?  No - Patient declined       Current Medications (verified) Outpatient Encounter Medications as of 02/12/2022  Medication Sig   Azelastine-Fluticasone 137-50 MCG/ACT SUSP 1-2 puffs each nostril twice daily as needed   Calcium Citrate-Vitamin D (CITRACAL + D PO) Take 1,200 mg by mouth daily.   Cholecalciferol (VITAMIN D3) 2000 units TABS Take 1 tablet by mouth daily.   conjugated estrogens (PREMARIN) vaginal cream USE 0.5 GRAM VAGINALLY 2 TIMES A WEEK   cromolyn (NASALCROM) 5.2 MG/ACT nasal spray Place 1 spray into both nostrils once for 1 dose. Taking once in the morning 06/19   levothyroxine (SYNTHROID) 25 MCG tablet TAKE 1 TABLET(25 MCG) BY MOUTH DAILY BEFORE BREAKFAST   Magnesium 250 MG TABS Take 250 mg by mouth daily.   montelukast (SINGULAIR) 10 MG tablet TAKE 1 TABLET(10 MG) BY MOUTH AT BEDTIME   Multiple Vitamins-Minerals (CENTRUM SILVER) tablet Take 1 tablet by mouth daily.   Omega-3 Fatty Acids (FISH OIL) 1200 MG CAPS Take by mouth daily.   PREVIDENT 5000 BOOSTER PLUS 1.1 % PSTE 3 (three) times daily as needed.   Zoledronic Acid (RECLAST IV) Inject into the vein. IV infusion yearly   [DISCONTINUED] famotidine (PEPCID) 10 MG tablet Take 10 mg by mouth daily.   No facility-administered encounter medications on file as of 02/12/2022.    Allergies (verified) Patient has no known allergies.   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   Osteoporosis    PMB (postmenopausal bleeding) 12/2001   polyps   Serum calcium elevated 2021   sees  Dr.Ellison/pulmonology with [facility]   Thyroid disease    hypothyroidism   URINARY INCONTINENCE    OAB   Wisdom teeth removed 1866/1967   Past Surgical History:  Procedure Laterality Date   BREAST CYST ASPIRATION  12/05/2001   CATARACT EXTRACTION Bilateral 07/2012, 09/2012   DILATION AND CURETTAGE OF UTERUS  2003   HYSTEROSCOPY  01/2002   resection, sm fibroid   Left knee surgey     MENISCUS REPAIR     left knee 2010   REFRACTIVE SURGERY  02/2016   WISDOM TOOTH EXTRACTION     Family History  Problem Relation Age of Onset   Alzheimer's disease Mother    Allergies Mother    Breast cancer Mother 71   Dementia Mother    Lung cancer Father 40   Allergies Brother    Diabetes Brother    Transient ischemic attack Maternal Grandmother    Peripheral Artery Disease Maternal Grandmother        s/p B leg amputation   Stroke Maternal Grandmother    Tuberculosis Maternal Grandfather    Diabetes Brother 64   Alzheimer's disease Brother 83       early onset   Sudden death Paternal Grandmother 40       unknown   Breast cancer Paternal Aunt    Osteoporosis Neg Hx    Social History   Socioeconomic History   Marital status: Married    Spouse name: Not on file   Number of children: Not on file   Years of education: Not on file   Highest education level: Not on file  Occupational History   Not on file  Tobacco Use   Smoking status: Never   Smokeless tobacco: Never  Vaping Use   Vaping Use: Never used  Substance and Sexual Activity   Alcohol use: No    Alcohol/week: 1.0 standard drink of alcohol    Types: 1 Standard drinks or equivalent per week   Drug use: No   Sexual activity: Not Currently    Partners: Male    Birth control/protection: Post-menopausal  Other Topics Concern   Not on file  Social History Narrative   Not on file   Social Determinants of Health   Financial Resource Strain: Low Risk  (02/12/2022)   Overall Financial Resource Strain (CARDIA)    Difficulty  of Paying Living Expenses: Not hard at all  Food Insecurity: No Food Insecurity (02/12/2022)   Hunger Vital Sign    Worried About Running Out of Food in the Last Year: Never true    Ran Out of Food in the Last Year: Never true  Transportation Needs: No Transportation Needs (11/10/2020)   PRAPARE - Hydrologist (Medical): No    Lack of Transportation (Non-Medical): No  Physical Activity: Inactive (02/12/2022)   Exercise Vital Sign    Days of Exercise per Week: 0 days  Minutes of Exercise per Session: 0 min  Stress: Stress Concern Present (02/12/2022)   [place]    Feeling of Stress : Rather much  Social Connections: Socially Integrated (02/12/2022)   Social Connection and Isolation Panel [NHANES]    Frequency of Communication with Friends and Family: Twice a week    Frequency of Social Gatherings with Friends and Family: Twice a week    Attends Religious Services: 1 to 4 times per year    Active Member of [employer] or Organizations: No    Attends Music therapist: 1 to 4 times per year    Marital Status: Married    Tobacco Counseling Counseling given: Not Answered   Clinical Intake:  Pre-visit preparation completed: Yes  Pain : No/denies pain     BMI - recorded: 20.83 Nutritional Status: BMI of 19-24  Normal Nutritional Risks: None Diabetes: No  How often do you need to have someone help you when you read instructions, pamphlets, or other written materials from your doctor or pharmacy?: 1 - Never What is the last grade level you completed in school?: HSG  Diabetic? no  Interpreter Needed?: No  Information entered by :: Lassie Demorest N. Terril Chestnut, LPN.   Activities of Daily Living    02/12/2022    3:20 PM  In your present state of health, do you have any difficulty performing the following activities:  Hearing? 1  Vision? 0  Difficulty concentrating or making  decisions? 1  Walking or climbing stairs? 0  Dressing or bathing? 0  Doing errands, shopping? 0  Preparing Food and eating ? N  Using the Toilet? N  In the past six months, have you accidently leaked urine? Y  Do you have problems with loss of bowel control? N  Managing your Medications? N  Managing your Finances? N  Housekeeping or managing your Housekeeping? N    Patient Care Team: Hoyt Koch, MD as PCP - General (Internal Medicine) Marlou Sa, Tonna Corner, MD (Orthopedic Surgery) Deneise Lever, MD (Pulmonary Disease) Juanita Craver, MD (Gastroenterology) Rutherford Guys, MD (Ophthalmology) Yisroel Ramming, Everardo All, MD (Obstetrics and Gynecology) Garvin Fila, MD as Consulting Physician (Neurology) Renato Shin, MD (Inactive) as Consulting Physician (Endocrinology) Eppie Gibson, MD as Consulting Physician (Radiation Oncology) Malmfelt, Stephani Police, RN as Oncology Nurse Navigator  Indicate any recent Medical Services you may have received from other than [facility] providers in the past year (date may be approximate).     Assessment:   This is a routine wellness examination for [place].  Hearing/Vision screen Hearing Screening - Comments:: Patient has right ear hearing aid. Vision Screening - Comments:: Patient does wear corrective lenses/contacts.  Eye exam done by: Rutherford Guys, MD.   Dietary issues and exercise activities discussed: Current Exercise Habits: The patient does not participate in regular exercise at present, Exercise limited by: orthopedic condition(s)   Goals Addressed             This Visit's Progress    Patient declined health goal at this time.        Depression Screen    02/12/2022    3:10 PM 12/01/2021    2:29 PM 06/18/2018    9:17 AM 06/11/2017    1:33 PM 02/13/2016    1:04 PM 02/03/2014    2:44 PM 01/28/2013    2:29 PM  PHQ 2/9 Scores  PHQ - 2 Score 2 0 2 0 0 0 0  PHQ- 9  Score   4        Fall Risk    02/12/2022    3:20 PM  02/03/2021    2:23 PM 06/18/2018    9:17 AM 06/11/2017    1:33 PM 02/13/2016    1:04 PM  Fall Risk   Falls in the past year? 0 0 No No No  Number falls in past yr: 0 0     Injury with Fall? 0 0     Risk for fall due to : No Fall Risks      Follow up Falls evaluation completed        [place]:  Any stairs in or around the home? Yes  If so, are there any without handrails? No  Home free of loose throw rugs in walkways, pet beds, electrical cords, etc? Yes  Adequate lighting in your home to reduce risk of falls? Yes   ASSISTIVE DEVICES UTILIZED TO PREVENT FALLS:  Life alert? No  Use of a cane, walker or w/c? No  Grab bars in the bathroom? Yes  Shower chair or bench in shower? Yes  Elevated toilet seat or a handicapped toilet? Yes   TIMED UP AND GO:  Was the test performed? No .  Length of time to ambulate 10 feet: n/a sec.   Appearance of gait: Patient not evaluated for gait during this visit.  Cognitive Function:    02/12/2022    3:21 PM 06/18/2018    9:45 AM  MMSE - Mini Mental State Exam  Not completed: Unable to complete Refused        02/12/2022    3:22 PM  6CIT Screen  What Year? 0 points  What month? 0 points  What time? 0 points  Count back from 20 0 points  Months in reverse 0 points  Repeat phrase 0 points  Total Score 0 points    Immunizations Immunization History  Administered Date(s) Administered   Influenza Split 05/14/2013   Influenza Whole 05/09/2011   Influenza, High Dose Seasonal PF 06/17/2017, 06/12/2018, 05/08/2019, 05/08/2019, 06/05/2020   Influenza,inj,Quad PF,6+ Mos 05/04/2014   Influenza-Unspecified 05/15/2012, 05/19/2012, 06/04/2015, 06/14/2016, 06/12/2018, 05/08/2019   PFIZER Comirnaty(Gray Top)Covid-19 Tri-Sucrose Vaccine 12/21/2020   PFIZER(Purple Top)SARS-COV-2 Vaccination 10/09/2019, 10/30/2019, 05/03/2020   Pneumococcal Conjugate-13 02/10/2015   Pneumococcal Polysaccharide-23 09/22/2008,  02/19/2017   Td 01/28/2013   Zoster Recombinat (Shingrix) 05/31/2021, 11/23/2021    TDAP status: Up to date  Flu Vaccine status: Up to date  Pneumococcal vaccine status: Up to date  Covid-19 vaccine status: Completed vaccines  Qualifies for Shingles Vaccine? Yes   Zostavax completed Yes   Shingrix Completed?: Yes  Screening Tests Health Maintenance  Topic Date Due   COVID-19 Vaccine (5 - Booster for Pfizer series) 02/15/2021   INFLUENZA VACCINE  04/03/2022   TETANUS/TDAP  01/29/2023   MAMMOGRAM  07/26/2023   COLONOSCOPY (Pts 45-15yr Insurance coverage will need to be confirmed)  06/16/2024   Pneumonia Vaccine 75 Years old  Completed   DEXA SCAN  Completed   Hepatitis C Screening  Completed   Zoster Vaccines- Shingrix  Completed   HPV VACCINES  Aged Out    Health Maintenance  Health Maintenance Due  Topic Date Due   COVID-19 Vaccine (5 - Booster for [place]) 02/15/2021    Colorectal cancer screening: Type of screening: Colonoscopy. Completed 06/16/2014. Repeat every 10 years  Mammogram status: Completed 07/25/2021. Repeat every year  Bone Density status: Completed 06/15/2021. Results reflect:  Bone density results: OSTEOPENIA. Repeat every 2-3 years.  Lung Cancer Screening: (Low Dose CT Chest recommended if Age 55-80 years, 30 pack-year currently smoking OR have quit w/in 15years.) does not qualify.   Lung Cancer Screening Referral: no  Additional Screening:  Hepatitis C Screening: does qualify; Completed 02/13/2016  Vision Screening: Recommended annual ophthalmology exams for early detection of glaucoma and other disorders of the eye. Is the patient up to date with their annual eye exam?  Yes  Who is the provider or what is the name of the office in which the patient attends annual eye exams? Rutherford Guys, MD. If pt is not established with a provider, would they like to be referred to a provider to establish care? No .   Dental Screening: Recommended  annual dental exams for proper oral hygiene  Community Resource Referral / Chronic Care Management: CRR required this visit?  No   CCM required this visit?  No      Plan:     I have personally reviewed and noted the following in the patient's chart:   Medical and social history Use of alcohol, tobacco or illicit drugs  Current medications and supplements including opioid prescriptions.  Functional ability and status Nutritional status Physical activity Advanced directives List of other physicians Hospitalizations, surgeries, and ER visits in previous 12 months Vitals Screenings to include cognitive, depression, and falls Referrals and appointments  In addition, I have reviewed and discussed with patient certain preventive protocols, quality metrics, and best practice recommendations. A written personalized care plan for preventive services as well as general preventive health recommendations were provided to patient.     Sheral Flow, LPN   03/09/2375   Nurse Notes:  There were no vitals filed for this visit. There is no height or weight on file to calculate BMI.

## 2022-02-12 NOTE — Patient Instructions (Signed)
[name] , Thank you for taking time to come for your Medicare Wellness Visit. I appreciate your ongoing commitment to your health goals. Please review the following plan we discussed and let me know if I can assist you in the future.   Screening recommendations/referrals: Colonoscopy: 06/16/2014; due every 10 years Mammogram: 07/25/2021; due very year Bone Density: 06/15/2021; due every 2-3 years Recommended yearly ophthalmology/optometry visit for glaucoma screening and checkup Recommended yearly dental visit for hygiene and checkup  Vaccinations: Influenza vaccine: 05/16/2021 Pneumococcal vaccine: 02/10/2015, 02/19/2017 Tdap vaccine: 01/28/2013; due every 10 years Shingles vaccine: 05/31/2021, 11/23/2021   Covid-19: 10/09/2019, 10/30/2019, 05/03/2020, 12/21/2020  Advanced directives: Yes; Please bring a copy of your health care power of attorney and living will to the office at your convenience.  Conditions/risks identified: Yes  Next appointment: Please schedule your next Medicare Wellness Visit with your Nurse Health Advisor in 1 year by calling [phone].   Preventive Care 75 Years and Older, Female Preventive care refers to lifestyle choices and visits with your health care provider that can promote health and wellness. What does preventive care include? A yearly physical exam. This is also called an annual well check. Dental exams once or twice a year. Routine eye exams. Ask your health care provider how often you should have your eyes checked. Personal lifestyle choices, including: Daily care of your teeth and gums. Regular physical activity. Eating a healthy diet. Avoiding tobacco and drug use. Limiting alcohol use. Practicing safe sex. Taking low-dose aspirin every day. Taking vitamin and mineral supplements as recommended by your health care provider. What happens during an annual well check? The services and screenings done by your health care provider during your annual  well check will depend on your age, overall health, lifestyle risk factors, and family history of disease. Counseling  Your health care provider may ask you questions about your: Alcohol use. Tobacco use. Drug use. Emotional well-being. Home and relationship well-being. Sexual activity. Eating habits. History of falls. Memory and ability to understand (cognition). Work and work Statistician. Reproductive health. Screening  You may have the following tests or measurements: Height, weight, and BMI. Blood pressure. Lipid and cholesterol levels. These may be checked every 5 years, or more frequently if you are over 21 years old. Skin check. Lung cancer screening. You may have this screening every year starting at age 75 if you have a 30-pack-year history of smoking and currently smoke or have quit within the past 15 years. Fecal occult blood test (FOBT) of the stool. You may have this test every year starting at age 75 Flexible sigmoidoscopy or colonoscopy. You may have a sigmoidoscopy every 5 years or a colonoscopy every 10 years starting at age 75. Hepatitis C blood test. Hepatitis B blood test. Sexually transmitted disease (STD) testing. Diabetes screening. This is done by checking your blood sugar (glucose) after you have not eaten for a while (fasting). You may have this done every 1-3 years. Bone density scan. This is done to screen for osteoporosis. You may have this done starting at age 80. Mammogram. This may be done every 1-2 years. Talk to your health care provider about how often you should have regular mammograms. Talk with your health care provider about your test results, treatment options, and if necessary, the need for more tests. Vaccines  Your health care provider may recommend certain vaccines, such as: Influenza vaccine. This is recommended every year. Tetanus, diphtheria, and acellular pertussis (Tdap, Td) vaccine. You may need a Td  booster every 10 years. Zoster  vaccine. You may need this after age 85. Pneumococcal 13-valent conjugate (PCV13) vaccine. One dose is recommended after age 16. Pneumococcal polysaccharide (PPSV23) vaccine. One dose is recommended after age 75. Talk to your health care provider about which screenings and vaccines you need and how often you need them. This information is not intended to replace advice given to you by your health care provider. Make sure you discuss any questions you have with your health care provider. Document Released: 09/16/2015 Document Revised: 05/09/2016 Document Reviewed: 06/21/2015 Elsevier Interactive Patient Education  2017 [employer] Prevention in the Home Falls can cause injuries. They can happen to people of all ages. There are many things you can do to make your home safe and to help prevent falls. What can I do on the outside of my home? Regularly fix the edges of walkways and driveways and fix any cracks. Remove anything that might make you trip as you walk through a door, such as a raised step or threshold. Trim any bushes or trees on the path to your home. Use bright outdoor lighting. Clear any walking paths of anything that might make someone trip, such as rocks or tools. Regularly check to see if handrails are loose or broken. Make sure that both sides of any steps have handrails. Any raised decks and porches should have guardrails on the edges. Have any leaves, snow, or ice cleared regularly. Use sand or salt on walking paths during winter. Clean up any spills in your garage right away. This includes oil or grease spills. What can I do in the bathroom? Use night lights. Install grab bars by the toilet and in the tub and shower. Do not use towel bars as grab bars. Use non-skid mats or decals in the tub or shower. If you need to sit down in the shower, use a plastic, non-slip stool. Keep the floor dry. Clean up any water that spills on the floor as soon as it happens. Remove  soap buildup in the tub or shower regularly. Attach bath mats securely with double-sided non-slip rug tape. Do not have throw rugs and other things on the floor that can make you trip. What can I do in the bedroom? Use night lights. Make sure that you have a light by your bed that is easy to reach. Do not use any sheets or blankets that are too big for your bed. They should not hang down onto the floor. Have a firm chair that has side arms. You can use this for support while you get dressed. Do not have throw rugs and other things on the floor that can make you trip. What can I do in the kitchen? Clean up any spills right away. Avoid walking on wet floors. Keep items that you use a lot in easy-to-reach places. If you need to reach something above you, use a strong step stool that has a grab bar. Keep electrical cords out of the way. Do not use floor polish or wax that makes floors slippery. If you must use wax, use non-skid floor wax. Do not have throw rugs and other things on the floor that can make you trip. What can I do with my stairs? Do not leave any items on the stairs. Make sure that there are handrails on both sides of the stairs and use them. Fix handrails that are broken or loose. Make sure that handrails are as long as the stairways. Check any carpeting  to make sure that it is firmly attached to the stairs. Fix any carpet that is loose or worn. Avoid having throw rugs at the top or bottom of the stairs. If you do have throw rugs, attach them to the floor with carpet tape. Make sure that you have a light switch at the top of the stairs and the bottom of the stairs. If you do not have them, ask someone to add them for you. What else can I do to help prevent falls? Wear shoes that: Do not have high heels. Have rubber bottoms. Are comfortable and fit you well. Are closed at the toe. Do not wear sandals. If you use a stepladder: Make sure that it is fully opened. Do not climb a  closed stepladder. Make sure that both sides of the stepladder are locked into place. Ask someone to hold it for you, if possible. Clearly mark and make sure that you can see: Any grab bars or handrails. First and last steps. Where the edge of each step is. Use tools that help you move around (mobility aids) if they are needed. These include: Canes. Walkers. Scooters. Crutches. Turn on the lights when you go into a dark area. Replace any light bulbs as soon as they burn out. Set up your furniture so you have a clear path. Avoid moving your furniture around. If any of your floors are uneven, fix them. If there are any pets around you, be aware of where they are. Review your medicines with your doctor. Some medicines can make you feel dizzy. This can increase your chance of falling. Ask your doctor what other things that you can do to help prevent falls. This information is not intended to replace advice given to you by your health care provider. Make sure you discuss any questions you have with your health care provider. Document Released: 06/16/2009 Document Revised: 01/26/2016 Document Reviewed: 09/24/2014 Elsevier Interactive Patient Education  2017 [employer].

## 2022-02-16 ENCOUNTER — Ambulatory Visit: Payer: Medicare Other | Admitting: Internal Medicine

## 2022-02-17 ENCOUNTER — Other Ambulatory Visit: Payer: Self-pay | Admitting: Internal Medicine

## 2022-03-08 ENCOUNTER — Telehealth (INDEPENDENT_AMBULATORY_CARE_PROVIDER_SITE_OTHER): Payer: Medicare Other | Admitting: Family Medicine

## 2022-03-08 DIAGNOSIS — F419 Anxiety disorder, unspecified: Secondary | ICD-10-CM

## 2022-03-08 DIAGNOSIS — F32A Depression, unspecified: Secondary | ICD-10-CM

## 2022-03-08 MED ORDER — ESCITALOPRAM OXALATE 5 MG PO TABS: 5.0000 mg | ORAL_TABLET | Freq: Every day | ORAL | 1 refills | Status: DC

## 2022-03-08 NOTE — Progress Notes (Signed)
MyChart Video Visit    Virtual Visit via Video Note   This visit type was conducted due to national recommendations for restrictions regarding the COVID-19 Pandemic (e.g. social distancing) in an effort to limit this patient's exposure and mitigate transmission in our community. This patient is at least at moderate risk for complications without adequate follow up. This format is felt to be most appropriate for this patient at this time. Physical exam was limited by quality of the video and audio technology used for the visit. CMA was able to get the patient set up on a video visit.  Patient location: Home. Patient and provider in visit Provider location: Office  I discussed the limitations of evaluation and management by telemedicine and the availability of in person appointments. The patient expressed understanding and agreed to proceed.  Visit Date: 03/08/2022  Today's healthcare provider: Harland Dingwall, NP-C     Subjective:    Patient ID: Mary Malone, female    DOB: 1945-09-18, 76 y.o.   MRN: [id number]  Chief Complaint  Patient presents with   Depression   Anxiety    Depression        Past medical history includes anxiety.   Anxiety      States she realizes she needs something for anxiety and depression or both.   She and her husband just moved into [facility]. She has a lot of stress. 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   Best friend of 30+ years recently died.   Difficulty sleeping and focusing.  Does not want to get out of bed.   States she took Prozac in 1992 and was on it until 2010. No medications since.   Appetite is ok.   No alcohol use.   No thoughts of self harm.    Denies fever, chills, dizziness, chest pain, palpitations, shortness of breath, abdominal pain, N/V/D.    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   Osteoporosis    PMB (postmenopausal bleeding) 12/2001   polyps   Serum calcium elevated 2021   sees Dr.Ellison/pulmonology with [facility]   Thyroid disease    hypothyroidism   URINARY INCONTINENCE    OAB   Wisdom teeth removed 1866/1967    Past Surgical History:  Procedure Laterality Date   BREAST CYST ASPIRATION  12/05/2001   CATARACT EXTRACTION Bilateral 07/2012, 09/2012   DILATION AND CURETTAGE OF UTERUS  2003   HYSTEROSCOPY  01/2002   resection, sm fibroid   Left knee surgey     MENISCUS REPAIR     left knee 2010   REFRACTIVE SURGERY  02/2016   WISDOM TOOTH EXTRACTION      Family History  Problem Relation Age of Onset   Alzheimer's disease Mother    Allergies Mother    Breast cancer Mother 62   Dementia Mother    Lung cancer Father 74   Allergies Brother    Diabetes Brother    Transient ischemic attack Maternal Grandmother    Peripheral Artery Disease Maternal Grandmother  s/p B leg amputation   Stroke Maternal Grandmother    Tuberculosis Maternal Grandfather    Diabetes Brother 37   Alzheimer's disease Brother 58       early onset   Sudden death Paternal Grandmother 87       unknown   Breast cancer Paternal Aunt    Osteoporosis Neg Hx     Social History   Socioeconomic History   Marital status: Married    Spouse name: Not on file   Number of children: Not on file   Years of education: Not on file   Highest education level: Not on file  Occupational History   Not on file  Tobacco Use   Smoking status: Never   Smokeless tobacco: Never  Vaping Use   Vaping Use: Never used  Substance and Sexual Activity   Alcohol use: No    Alcohol/week: 1.0 standard drink of alcohol    Types: 1 Standard drinks or equivalent per week   Drug use: No   Sexual activity: Not  Currently    Partners: Male    Birth control/protection: Post-menopausal  Other Topics Concern   Not on file  Social History Narrative   Not on file   Social Determinants of Health   Financial Resource Strain: Low Risk  (02/12/2022)   Overall Financial Resource Strain (CARDIA)    Difficulty of Paying Living Expenses: Not hard at all  Food Insecurity: No Food Insecurity (02/12/2022)   Hunger Vital Sign    Worried About Running Out of Food in the Last Year: Never true    [place] in the Last Year: Never true  Transportation Needs: No Transportation Needs (11/10/2020)   PRAPARE - Hydrologist (Medical): No    Lack of Transportation (Non-Medical): No  Physical Activity: Inactive (02/12/2022)   Exercise Vital Sign    Days of Exercise per Week: 0 days    Minutes of Exercise per Session: 0 min  Stress: Stress Concern Present (02/12/2022)   [place]    Feeling of Stress : Rather much  Social Connections: Socially Integrated (02/12/2022)   Social Connection and Isolation Panel [NHANES]    Frequency of Communication with Friends and Family: Twice a week    Frequency of Social Gatherings with Friends and Family: Twice a week    Attends Religious Services: 1 to 4 times per year    Active Member of [employer] or Organizations: No    Attends Archivist Meetings: 1 to 4 times per year    Marital Status: Married  Human resources officer Violence: Not At Risk (02/12/2022)   Humiliation, Afraid, Rape, and Kick questionnaire    Fear of Current or Ex-Partner: No    Emotionally Abused: No    Physically Abused: No    Sexually Abused: No    Outpatient Medications Prior to Visit  Medication Sig Dispense Refill   Azelastine-Fluticasone 137-50 MCG/ACT SUSP SHAKE LIQUID AND USE 1 TO 2 SPRAYS IN EACH NOSTRIL TWICE DAILY AS NEEDED 23 g 12   Calcium Citrate-Vitamin D (CITRACAL + D PO) Take 1,200 mg by mouth  daily.     Cholecalciferol (VITAMIN D3) 2000 units TABS Take 1 tablet by mouth daily.     conjugated estrogens (PREMARIN) vaginal cream USE 0.5 GRAM VAGINALLY 2 TIMES A WEEK 30 g 1   levothyroxine (SYNTHROID) 25 MCG tablet TAKE 1 TABLET(25 MCG) BY MOUTH DAILY BEFORE BREAKFAST 90 tablet  3   Magnesium 250 MG TABS Take 250 mg by mouth daily.     montelukast (SINGULAIR) 10 MG tablet TAKE 1 TABLET(10 MG) BY MOUTH AT BEDTIME 90 tablet 4   Multiple Vitamins-Minerals (CENTRUM SILVER) tablet Take 1 tablet by mouth daily.     Omega-3 Fatty Acids (FISH OIL) 1200 MG CAPS Take by mouth daily.     PREVIDENT 5000 BOOSTER PLUS 1.1 % PSTE 3 (three) times daily as needed.     Zoledronic Acid (RECLAST IV) Inject into the vein. IV infusion yearly     cromolyn (NASALCROM) 5.2 MG/ACT nasal spray Place 1 spray into both nostrils once for 1 dose. Taking once in the morning 06/19 26 mL 12   No facility-administered medications prior to visit.    No Known Allergies  Review of Systems  Psychiatric/Behavioral:  Positive for depression.    Pertinent positives and negatives in the history of present illness.     Objective:    Physical Exam Constitutional:      General: She is not in acute distress.    Appearance: She is not ill-appearing.  Pulmonary:     Effort: Pulmonary effort is normal.  Neurological:     Mental Status: She is alert and oriented to person, place, and time.  Psychiatric:        Mood and Affect: Mood is depressed.        Speech: Speech normal.        Thought Content: Thought content normal.     LMP 09/03/1996  Wt Readings from Last 3 Encounters:  01/16/22 125 lb 3.2 oz (56.8 kg)  12/01/21 130 lb (59 kg)  06/14/21 134 lb (60.8 kg)       Assessment & Plan:   Problem List Items Addressed This Visit       Other   Anxiety and depression - Primary    Lexapro 5 mg sent to pharmacy.  Discussed starting on a low-dose and potential side effects such as nausea and dull headache.  She  will call if she has any severe symptoms or worsening thoughts of depression.  Recommend scheduling with the therapist which we she will consider doing.  Follow-up in 4 weeks with Dr. Sharlet Salina or sooner if needed.      Relevant Medications   escitalopram (LEXAPRO) 5 MG tablet    I am having Mary Malone start on escitalopram. I am also having her maintain her Centrum Silver, Calcium Citrate-Vitamin D (CITRACAL + D PO), Fish Oil, Zoledronic Acid (RECLAST IV), Vitamin D3, PreviDent 5000 Booster Plus, cromolyn, levothyroxine, Magnesium, Premarin, montelukast, and Azelastine-Fluticasone.  Meds ordered this encounter  Medications   escitalopram (LEXAPRO) 5 MG tablet    Sig: Take 1 tablet (5 mg total) by mouth daily.    Dispense:  30 tablet    Refill:  1    Order Specific Question:   Supervising Provider    Answer:   Pricilla Holm A [[id number]]    I discussed the assessment and treatment plan with the patient. The patient was provided an opportunity to ask questions and all were answered. The patient agreed with the plan and demonstrated an understanding of the instructions.   The patient was advised to call back or seek an in-person evaluation if the symptoms worsen or if the condition fails to improve as anticipated.  I provided 20 minutes of face-to-face time during this encounter.   Harland Dingwall, NP-C [facility] [phone] (phone) [phone] (fax)  [facility]

## 2022-03-08 NOTE — Patient Instructions (Signed)
You can call to schedule your appointment with the psychiatrist/counselor. A few offices are listed below for you to call.       [facility]  [address] [place], [place], [place] [address]  Phone: [phone]   [facility] [address] [place], [place] [address]  Phone: [phone]

## 2022-03-08 NOTE — Assessment & Plan Note (Signed)
Lexapro 5 mg sent to pharmacy.  Discussed starting on a low-dose and potential side effects such as nausea and dull headache.  She will call if she has any severe symptoms or worsening thoughts of depression.  Recommend scheduling with the therapist which we she will consider doing.  Follow-up in 4 weeks with Dr. Sharlet Salina or sooner if needed.

## 2022-03-14 NOTE — Progress Notes (Signed)
HPI female never smoker (widow-husband died of lung cancer) followed for allergic rhinitis, complicated by GERD IgE total 02/20/16- 7 EOS 0.2 Allergy Profile 05/12/15- negative with minor elevation only for cat dander, total IgE 12 ---------------------------------------------------------------------------------------------------   02/16/21- 76 year old female never smoker (widow-husband died of lung cancer) followed for lung nodule, allergic rhinitis, complicated by GERD, hearing loss CT in 2018 was negative. Nasalcrom, Singulair, Astelin-fluticasone Covid vax- 2 Phizer ------No current respiratory concerns Breathing ok- no chest exacerbation. Nose - +healing slowly , still irritated after XRT for basal cell.  .Discussed role of nasal sprays. Social stress- husband Rush Landmark has Alzheimer's.  She chooses to defer CXR this year( hx second hand smoke 1st husband) CXR 02/18/20- IMPRESSION: 1. Stable exam, no acute process.  03/15/22- 76 year old female never smoker (widow-1st husband died of lung cancer) followed for lung nodule, allergic rhinitis, complicated by GERD, hearing loss, Anxiety/Depression,  CT in 2018 was negative. -Nasalcrom, Singulair, Astelin-fluticasone, Covid vax- 4Phizer -------Patient is having a little shortness breath and pain in upper right lung a few months ago. Notices it mostly with activity. And having some ankle swelling. No cough Continue Singulair and nasal sprays.  Some dyspnea on exertion without cough or acute event.  Intermittent pains around the right scapula, pattern not really clear but seems to be related to use of arm. Aware of depression and has started treatment for this.  ROS-see HPI  + = positive Constitutional:   No-   weight loss, night sweats, fevers, chills, fatigue, lassitude. HEENT:   No-  headaches, difficulty swallowing, tooth/dental problems, sore throat,        sneezing, itching, ear ache, nasal congestion, +post nasal drip,  CV:  No-   chest  pain, orthopnea, PND, swelling in lower extremities, anasarca,  dizziness, palpitations Resp: No-   shortness of breath with exertion or at rest.              No-   productive cough,   non-productive cough,  No- coughing up of blood.              No-   change in color of mucus.  No- wheezing.   Skin: No-   rash or lesions. GI:    heartburn, indigestion,no- abdominal pain, nausea, vomiting,  GU:  MS:  No-   joint pain or swelling.  +pains R scapula Neuro-     nothing unusual Psych:  No- change in mood or affect. + depression or anxiety.  No memory loss.  OBJ- Physical Exam General- Alert, Oriented, Affect-appropriate, Distress- none acute, trim Skin- rash-none, lesions- none, excoriation- none , +pale Lymphadenopathy- none Head- atraumatic            Eyes- Gross vision intact, PERRLA, conjunctivae and secretions clear            Ears- no problem with face-to-face conversation in quiet exam room. +R tube.             Nose- clear, no-Septal dev, mucus, polyps, erosion, perforation             Throat- Mallampati IV , mucosa clear , drainage- none, tonsils- atrophic Neck- flexible , trachea midline, no stridor , thyroid nl, carotid no bruit Chest - symmetrical excursion , unlabored           Heart/CV- RRR , no murmur , no gallop  , no rub, nl s1 s2                           -  JVD- none , edema- none, stasis changes- none, varices- none           Lung- clear to P&A, wheeze- none, cough- none , dullness-none, rub- none           Chest wall-  Abd-  Br/ Gen/ Rectal- Not done, not indicated Extrem- cyanosis- none, clubbing, none, atrophy- none, strength- nl Neuro- grossly intact to observation

## 2022-03-15 ENCOUNTER — Ambulatory Visit (INDEPENDENT_AMBULATORY_CARE_PROVIDER_SITE_OTHER): Payer: Medicare Other

## 2022-03-15 ENCOUNTER — Encounter: Payer: Self-pay | Admitting: Internal Medicine

## 2022-03-15 ENCOUNTER — Ambulatory Visit (INDEPENDENT_AMBULATORY_CARE_PROVIDER_SITE_OTHER): Payer: Medicare Other | Admitting: Internal Medicine

## 2022-03-15 VITALS — BP 140/70 | HR 67 | Temp 98.1°F | Ht 65.0 in | Wt 117.4 lb

## 2022-03-15 DIAGNOSIS — J302 Other seasonal allergic rhinitis: Secondary | ICD-10-CM | POA: Diagnosis not present

## 2022-03-15 DIAGNOSIS — R0609 Other forms of dyspnea: Secondary | ICD-10-CM

## 2022-03-15 DIAGNOSIS — J3089 Other allergic rhinitis: Secondary | ICD-10-CM | POA: Diagnosis not present

## 2022-03-15 DIAGNOSIS — R911 Solitary pulmonary nodule: Secondary | ICD-10-CM

## 2022-03-15 LAB — CBC WITH DIFFERENTIAL/PLATELET
Basophils Absolute: 0.1 10*3/uL (ref 0.0–0.1)
Basophils Relative: 0.9 % (ref 0.0–3.0)
Eosinophils Absolute: 0.1 10*3/uL (ref 0.0–0.7)
Eosinophils Relative: 1.3 % (ref 0.0–5.0)
HCT: 39.2 % (ref 36.0–46.0)
Hemoglobin: 13.2 g/dL (ref 12.0–15.0)
Lymphocytes Relative: 15.8 % (ref 12.0–46.0)
Lymphs Abs: 0.9 10*3/uL (ref 0.7–4.0)
MCHC: 33.6 g/dL (ref 30.0–36.0)
MCV: 94.7 fl (ref 78.0–100.0)
Monocytes Absolute: 0.6 10*3/uL (ref 0.1–1.0)
Monocytes Relative: 9.6 % (ref 3.0–12.0)
Neutro Abs: 4.3 10*3/uL (ref 1.4–7.7)
Neutrophils Relative %: 72.4 % (ref 43.0–77.0)
Platelets: 231 10*3/uL (ref 150.0–400.0)
RBC: 4.14 Mil/uL (ref 3.87–5.11)
RDW: 13 % (ref 11.5–15.5)
WBC: 5.9 10*3/uL (ref 4.0–10.5)

## 2022-03-15 NOTE — Patient Instructions (Signed)
Order- CBC w diff    dx dyspnea on exertion  Order CXR- dyspnea on exertion, right upper chest wall pain  Please call if we can help. [facility] works out well.

## 2022-03-28 ENCOUNTER — Ambulatory Visit (INDEPENDENT_AMBULATORY_CARE_PROVIDER_SITE_OTHER): Payer: Medicare Other | Admitting: Orthopedic Surgery

## 2022-03-28 DIAGNOSIS — M25571 Pain in right ankle and joints of right foot: Secondary | ICD-10-CM | POA: Diagnosis not present

## 2022-03-31 ENCOUNTER — Encounter: Payer: Self-pay | Admitting: Orthopedic Surgery

## 2022-03-31 NOTE — Progress Notes (Signed)
Office Visit Note   Patient: Mary Malone           Date of Birth: 1946-08-26           MRN: [id number] Visit Date: 03/28/2022 Requested by: Hoyt Koch, MD [address] [place],  [place] [address] PCP: Hoyt Koch, MD  Subjective: Chief Complaint  Patient presents with   Ankle Pain    HPI: Mary Malone is a 76 year old patient with right ankle pain.  Husband fell on the leg July 12.  Taking Tylenol at night for pain.  Radiographs from outside regional reviewed.  She has been weightbearing as tolerated in fracture boot.  She has also been able to walk in tennis shoes.  Localizes pain around the metatarsal region.              ROS: All systems reviewed are negative as they relate to the chief complaint within the history of present illness.  Patient denies  fevers or chills.   Assessment & Plan: Visit Diagnoses:  1. Pain in right ankle and joints of right foot     Plan: Impression is likely occult foot fracture following injury to the ankle about 2 weeks ago.  Patient has been able to ambulate in regular shoes.  Review of radiographs today from the time of injury may demonstrate occult nondisplaced foot fractures.  Plan at this time is to continue weightbearing as tolerated in either tennis shoe or fracture boot as comfort allows.  Ankle is stable today.  Ecchymosis and swelling is resolving.  3-week return for clinical recheck and repeat radiographs at that time.  Follow-Up Instructions: No follow-ups on file.   Orders:  No orders of the defined types were placed in this encounter.  No orders of the defined types were placed in this encounter.     Procedures: No procedures performed   Clinical Data: No additional findings.  Objective: Vital Signs: LMP 09/03/1996   Physical Exam:   Constitutional: Patient appears well-developed HEENT:  Head: Normocephalic Eyes:EOM are normal Neck: Normal range of motion Cardiovascular: Normal  rate Pulmonary/chest: Effort normal Neurologic: Patient is alert Skin: Skin is warm Psychiatric: Patient has normal mood and affect   Ortho Exam: Ortho exam demonstrates palpable pedal pulses.  There is some resolving ecchymosis around the forefoot.  No pain with pronation supination of the forefoot or with passive motion at the MTP joints.  Medial lateral malleolus nontender.  Ankle is stable.  Palpable intact nontender anterior to posterior to peroneal and Achilles tendons.  Specialty Comments:  No specialty comments available.  Imaging: No results found.   PMFS History: Patient Active Problem List   Diagnosis Date Noted   Cervical radiculopathy 02/03/2021   Basal cell carcinoma (BCC) of lateral side wall of nose 11/09/2020   Hypercalcemia 04/20/2020   Numbness 04/19/2020   Left leg pain 07/08/2018   Sacral back pain 04/16/2018   Lung nodule 02/19/2017   Osteoporosis 08/09/2016   Hearing loss 02/21/2016   White matter abnormality on MRI of brain 07/11/2015   MCI (mild cognitive impairment) 07/11/2015   Routine general medical examination at a health care facility 02/11/2015   Anxiety and depression 11/01/2010   OAB (overactive bladder)    GERD 09/23/2007   Perennial allergic rhinitis with seasonal variation 09/22/2007   Past Medical History:  Diagnosis Date   ALLERGIC RHINITIS    Back pain    Cancer ([place]) 12/02/2020   basal cell of nose   Dense breast  DEPRESSION    Fibroid    GERD    Hearing loss    History of radiation therapy 12/02/2020   for basal cell carcinoma of nose   HYPERLIPIDEMIA    diet control efforts   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   Osteoporosis    PMB (postmenopausal bleeding) 12/2001   polyps   Serum calcium elevated 2021   sees Dr.Ellison/pulmonology with [facility] [place]   Thyroid disease    hypothyroidism   URINARY INCONTINENCE    OAB   Wisdom teeth removed 1866/1967    Family History  Problem Relation Age of Onset   Alzheimer's  disease Mother    Allergies Mother    Breast cancer Mother 69   Dementia Mother    Lung cancer Father 82   Allergies Brother    Diabetes Brother    Transient ischemic attack Maternal Grandmother    Peripheral Artery Disease Maternal Grandmother        s/p B leg amputation   Stroke Maternal Grandmother    Tuberculosis Maternal Grandfather    Diabetes Brother 11   Alzheimer's disease Brother 48       early onset   Sudden death Paternal Grandmother 59       unknown   Breast cancer Paternal Aunt    Osteoporosis Neg Hx     Past Surgical History:  Procedure Laterality Date   BREAST CYST ASPIRATION  12/05/2001   CATARACT EXTRACTION Bilateral 07/2012, 09/2012   DILATION AND CURETTAGE OF UTERUS  2003   HYSTEROSCOPY  01/2002   resection, sm fibroid   Left knee surgey     MENISCUS REPAIR     left knee 2010   REFRACTIVE SURGERY  02/2016   WISDOM TOOTH EXTRACTION     Social History   Occupational History   Not on file  Tobacco Use   Smoking status: Never   Smokeless tobacco: Never  Vaping Use   Vaping Use: Never used  Substance and Sexual Activity   Alcohol use: No    Alcohol/week: 1.0 standard drink of alcohol    Types: 1 Standard drinks or equivalent per week   Drug use: No   Sexual activity: Not Currently    Partners: Male    Birth control/protection: Post-menopausal

## 2022-04-02 ENCOUNTER — Telehealth: Payer: Medicare Other | Admitting: Internal Medicine

## 2022-04-02 NOTE — Progress Notes (Unsigned)
Virtual Visit via Video Note  I connected with Mary Malone on 04/02/22 at  1:20 PM EDT by a video enabled telemedicine application and verified that I am speaking with the correct person using two identifiers.  The patient and the provider were at separate locations throughout the entire encounter. Patient location: home, Provider location: work   I discussed the limitations of evaluation and management by telemedicine and the availability of in person appointments. The patient expressed understanding and agreed to proceed. The patient and the provider were the only parties present for the visit unless noted in HPI below.  History of Present Illness: The patient is a 76 y.o. female with visit for follow up starting lexapro 5 mg daily.   Observations/Objective: Appearance: {}, breathing appears {}, {} grooming, abdomen {} appear distended, throat {}, memory {}, mental status is {}  Assessment and Plan: See problem oriented charting  Follow Up Instructions: {}  I discussed the assessment and treatment plan with the patient. The patient was provided an opportunity to ask questions and all were answered. The patient agreed with the plan and demonstrated an understanding of the instructions.   The patient was advised to call back or seek an in-person evaluation if the symptoms worsen or if the condition fails to improve as anticipated.  Hoyt Koch, MD

## 2022-04-17 ENCOUNTER — Encounter: Payer: Self-pay | Admitting: Internal Medicine

## 2022-04-17 ENCOUNTER — Ambulatory Visit (INDEPENDENT_AMBULATORY_CARE_PROVIDER_SITE_OTHER): Payer: Medicare Other | Admitting: Internal Medicine

## 2022-04-17 DIAGNOSIS — F419 Anxiety disorder, unspecified: Secondary | ICD-10-CM | POA: Diagnosis not present

## 2022-04-17 DIAGNOSIS — F32A Depression, unspecified: Secondary | ICD-10-CM

## 2022-04-17 MED ORDER — ESCITALOPRAM OXALATE 10 MG PO TABS: 10.0000 mg | ORAL_TABLET | Freq: Every day | ORAL | 2 refills | Status: DC

## 2022-04-17 NOTE — Patient Instructions (Signed)
We will increase the lexapro to 10 mg daily (take 2 pills of what you have left). When you get the new rx it will be 10 mg (just take 1).

## 2022-04-17 NOTE — Progress Notes (Signed)
   Subjective:   Patient ID: Mary Malone, female    DOB: [birth year], 76 y.o.   MRN: [id number]  HPI The patient is a 76 YO female coming in for depression follow up and leg swelling (recent sprain of ankle)  Review of Systems  Constitutional: Negative.   HENT: Negative.    Eyes: Negative.   Respiratory:  Negative for cough, chest tightness and shortness of breath.   Cardiovascular:  Negative for chest pain, palpitations and leg swelling.  Gastrointestinal:  Negative for abdominal distention, abdominal pain, constipation, diarrhea, nausea and vomiting.  Musculoskeletal: Negative.   Skin: Negative.   Neurological: Negative.   Psychiatric/Behavioral:  Positive for decreased concentration, dysphoric mood and sleep disturbance. The patient is nervous/anxious.     Objective:  Physical Exam Constitutional:      Appearance: She is well-developed.  HENT:     Head: Normocephalic and atraumatic.  Cardiovascular:     Rate and Rhythm: Normal rate and regular rhythm.  Pulmonary:     Effort: Pulmonary effort is normal. No respiratory distress.     Breath sounds: Normal breath sounds. No wheezing or rales.  Abdominal:     General: Bowel sounds are normal. There is no distension.     Palpations: Abdomen is soft.     Tenderness: There is no abdominal tenderness. There is no rebound.  Musculoskeletal:     Cervical back: Normal range of motion.  Skin:    General: Skin is warm and dry.  Neurological:     Mental Status: She is alert and oriented to person, place, and time.     Coordination: Coordination normal.     Vitals:   04/17/22 1320  BP: (!) 140/76  Pulse: 70  Temp: 97.8 F (36.6 C)  TempSrc: Temporal  SpO2: 98%  Weight: 114 lb (51.7 kg)  Height: '5\' 5"'$  (1.651 m)    Assessment & Plan:

## 2022-04-18 ENCOUNTER — Ambulatory Visit (INDEPENDENT_AMBULATORY_CARE_PROVIDER_SITE_OTHER): Payer: Medicare Other

## 2022-04-18 ENCOUNTER — Ambulatory Visit (INDEPENDENT_AMBULATORY_CARE_PROVIDER_SITE_OTHER): Payer: Medicare Other | Admitting: Surgical

## 2022-04-18 DIAGNOSIS — M25571 Pain in right ankle and joints of right foot: Secondary | ICD-10-CM

## 2022-04-20 NOTE — Assessment & Plan Note (Signed)
Increase lexapro to 10 mg daily. Asked her to let us know in 3-4 weeks how she is doing.

## 2022-04-22 ENCOUNTER — Encounter: Payer: Self-pay | Admitting: Orthopedic Surgery

## 2022-04-22 NOTE — Progress Notes (Signed)
Office Visit Note   Patient: Mary Malone           Date of Birth: 1946/08/12           MRN: [id number] Visit Date: 04/18/2022 Requested by: Hoyt Koch, MD [address] [place],  [place] [id number] PCP: Hoyt Koch, MD  Subjective: Chief Complaint  Patient presents with   Right Foot - Pain    HPI: Mary Malone is a 76 y.o. female who presents to the office for follow-up of right foot injury sustained on 03/14/2022.  Reports she has occasional swelling at times and intermittent pain but overall she feels steady improvement.  Most of her pain is localized to the lateral aspect of the ankle.  She notes 80% improvement since the initial onset of pain.  She is ambulating in a regular shoe.  Pain does not wake her up at night.  She only has to take occasional Tylenol.  She has not had a wall or plateaued yet..                ROS: All systems reviewed are negative as they relate to the chief complaint within the history of present illness.  Patient denies fevers or chills.  Assessment & Plan: Visit Diagnoses:  1. Pain in right ankle and joints of right foot     Plan: Patient is a 76 year old female who returns for reevaluation of right foot/ankle pain.  She is about 5 weeks out from initial injury and notes 80% improvement.  No significant worsening of her pain despite ambulating in regular shoe at this point.  Only has take occasional Tylenol.  No deficits or significant swelling noted on exam today.  Plan for her to follow-up with the office as needed if pain recurs.  Radiographs taken today are negative for any identifiable fracture or acute process in the area of her pain.  Follow-Up Instructions: No follow-ups on file.   Orders:  Orders Placed This Encounter  Procedures   XR Foot Complete Right   No orders of the defined types were placed in this encounter.     Procedures: No procedures performed   Clinical Data: No additional  findings.  Objective: Vital Signs: LMP 09/03/1996   Physical Exam:  Constitutional: Patient appears well-developed HEENT:  Head: Normocephalic Eyes:EOM are normal Neck: Normal range of motion Cardiovascular: Normal rate Pulmonary/chest: Effort normal Neurologic: Patient is alert Skin: Skin is warm Psychiatric: Patient has normal mood and affect  Ortho Exam: Ortho exam demonstrates right foot with palpable DP pulse.  Intact ankle dorsiflexion, plantarflexion, inversion, eversion.  No calf tenderness.  Negative Homans' sign.  No bruising or ecchymosis noted.  No swelling noted.  No tenderness over the lateral/medial malleoli, Lisfranc complex, fifth metatarsal base, forefoot, midfoot, Achilles tendon, Achilles tendon insertion.  Achilles tendon is palpable and intact.  Anterior tibialis tendon is palpable and intact.  Intact EHL.  No laxity to stressing of the syndesmosis.  Specialty Comments:  No specialty comments available.  Imaging: No results found.   PMFS History: Patient Active Problem List   Diagnosis Date Noted   Cervical radiculopathy 02/03/2021   Basal cell carcinoma (BCC) of lateral side wall of nose 11/09/2020   Hypercalcemia 04/20/2020   Numbness 04/19/2020   Left leg pain 07/08/2018   Sacral back pain 04/16/2018   Lung nodule 02/19/2017   Osteoporosis 08/09/2016   Hearing loss 02/21/2016   White matter abnormality on MRI of brain 07/11/2015  MCI (mild cognitive impairment) 07/11/2015   Routine general medical examination at a health care facility 02/11/2015   Anxiety and depression 11/01/2010   OAB (overactive bladder)    GERD 09/23/2007   Perennial allergic rhinitis with seasonal variation 09/22/2007   Past Medical History:  Diagnosis Date   ALLERGIC RHINITIS    Back pain    Cancer ([place]) 12/02/2020   basal cell of nose   Dense breast    DEPRESSION    Fibroid    GERD    Hearing loss    History of radiation therapy 12/02/2020   for basal cell  carcinoma of nose   HYPERLIPIDEMIA    diet control efforts   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   Osteoporosis    PMB (postmenopausal bleeding) 12/2001   polyps   Serum calcium elevated 2021   sees Dr.Ellison/pulmonology with [facility]   Thyroid disease    hypothyroidism   URINARY INCONTINENCE    OAB   Wisdom teeth removed 1866/1967    Family History  Problem Relation Age of Onset   Alzheimer's disease Mother    Allergies Mother    Breast cancer Mother 61   Dementia Mother    Lung cancer Father 28   Allergies Brother    Diabetes Brother    Transient ischemic attack Maternal Grandmother    Peripheral Artery Disease Maternal Grandmother        s/p B leg amputation   Stroke Maternal Grandmother    Tuberculosis Maternal Grandfather    Diabetes Brother 66   Alzheimer's disease Brother 75       early onset   Sudden death Paternal Grandmother 70       unknown   Breast cancer Paternal Aunt    Osteoporosis Neg Hx     Past Surgical History:  Procedure Laterality Date   BREAST CYST ASPIRATION  12/05/2001   CATARACT EXTRACTION Bilateral 07/2012, 09/2012   DILATION AND CURETTAGE OF UTERUS  2003   HYSTEROSCOPY  01/2002   resection, sm fibroid   Left knee surgey     MENISCUS REPAIR     left knee 2010   REFRACTIVE SURGERY  02/2016   WISDOM TOOTH EXTRACTION     Social History   Occupational History   Not on file  Tobacco Use   Smoking status: Never   Smokeless tobacco: Never  Vaping Use   Vaping Use: Never used  Substance and Sexual Activity   Alcohol use: No    Alcohol/week: 1.0 standard drink of alcohol    Types: 1 Standard drinks or equivalent per week   Drug use: No   Sexual activity: Not Currently    Partners: Male    Birth control/protection: Post-menopausal

## 2022-04-25 ENCOUNTER — Telehealth: Payer: Self-pay | Admitting: Internal Medicine

## 2022-04-25 MED ORDER — SERTRALINE HCL 25 MG PO TABS: 25.0000 mg | ORAL_TABLET | Freq: Every day | ORAL | 3 refills | Status: DC

## 2022-04-25 NOTE — Telephone Encounter (Signed)
Patient states that she has been having dizzy spells since staring the escitalopram (LEXAPRO) 10 MG tablet.  She is wondering if there is a different medication that she can try. Call back number is [phone].

## 2022-04-25 NOTE — Telephone Encounter (Signed)
Okay to stop lexapro and have sent in zoloft instead. Call us in 2-3 weeks after starting to let us know how it is going.

## 2022-04-25 NOTE — Telephone Encounter (Signed)
Spoke with and was able to inform her if Dr. Nathanial Millman instructions. Pt states she understands and has no questions at this time.

## 2022-04-27 DIAGNOSIS — R0609 Other forms of dyspnea: Secondary | ICD-10-CM | POA: Insufficient documentation

## 2022-04-27 NOTE — Assessment & Plan Note (Signed)
She uses Singulair and Dymista

## 2022-04-27 NOTE — Assessment & Plan Note (Signed)
Updating CXR 

## 2022-04-27 NOTE — Assessment & Plan Note (Signed)
Exam is not concerning. Plan-CXR, CBC

## 2022-05-02 ENCOUNTER — Other Ambulatory Visit: Payer: Self-pay | Admitting: Family Medicine

## 2022-05-03 NOTE — Telephone Encounter (Signed)
Per OV notes on 04/17/22 pt is going to be switched to Lexapro 10 mg. Can you please send in initial prescription?

## 2022-06-11 ENCOUNTER — Other Ambulatory Visit: Payer: Self-pay | Admitting: Internal Medicine

## 2022-06-11 DIAGNOSIS — Z1231 Encounter for screening mammogram for malignant neoplasm of breast: Secondary | ICD-10-CM

## 2022-06-19 ENCOUNTER — Ambulatory Visit: Payer: Medicare Other | Admitting: Obstetrics and Gynecology

## 2022-07-16 ENCOUNTER — Encounter: Payer: Self-pay | Admitting: Obstetrics and Gynecology

## 2022-07-16 ENCOUNTER — Ambulatory Visit (INDEPENDENT_AMBULATORY_CARE_PROVIDER_SITE_OTHER): Payer: Medicare Other | Admitting: Obstetrics and Gynecology

## 2022-07-16 VITALS — BP 136/80 | HR 72 | Ht 64.5 in | Wt 113.0 lb

## 2022-07-16 DIAGNOSIS — N952 Postmenopausal atrophic vaginitis: Secondary | ICD-10-CM | POA: Diagnosis not present

## 2022-07-16 DIAGNOSIS — Z634 Disappearance and death of family member: Secondary | ICD-10-CM

## 2022-07-16 MED ORDER — PREMARIN 0.625 MG/GM VA CREA: TOPICAL_CREAM | VAGINAL | 1 refills | Status: DC

## 2022-07-16 NOTE — Progress Notes (Unsigned)
76 y.o. G67P0 Widowed Caucasian female here for office visit.   She is followed for atrophy and urinary incontinence. Not using vaginal estrogen cream.  She does need a refill.   Still leaking urine occasionally with cough/sneeze/and no warning.  Wears a pad.    Has had pelvic floor therapy.    Patient's husband did have HPV head and neck cancer.  He passed.  She will see a Social worker.  PCP:   Pricilla Holm, MD  Patient's last menstrual period was 09/03/1996.           Sexually active: No.  The current method of family planning is post menopausal status.    Exercising: Yes.    Home exercise routine includes  . Smoker:  no  Health Maintenance: Pap:  06/14/21 - neg HR HPV, 06-08-20 Neg, 05-29-18 Neg, 05/10/16 Neg.  History of abnormal Pap:  Yes,04-19-14 ASCUS:Neg HR HPV, Hx of Pos HPV in 2002?  MMG:  07/25/21 BI-RADS CATEGORY 1 NEG.  Has appointment in next 1 - 2 weeks.  Colonoscopy:  06/16/2014 BMD:   06/15/21  Result  osteopenic.  Reclast last year.   Dr. Buddy Duty following.  TDaP:  01/28/2013    reports that she has never smoked. She has never used smokeless tobacco. She reports that she does not drink alcohol and does not use drugs.  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   Osteoporosis    PMB (postmenopausal bleeding) 12/2001   polyps   Serum calcium elevated 2021   sees Dr.Ellison/pulmonology with [facility]   Thyroid disease    hypothyroidism   URINARY INCONTINENCE    OAB   Wisdom teeth removed 1866/1967    Past Surgical History:  Procedure Laterality Date   BREAST CYST ASPIRATION  12/05/2001   CATARACT EXTRACTION Bilateral 07/2012, 09/2012   DILATION AND  CURETTAGE OF UTERUS  2003   HYSTEROSCOPY  01/2002   resection, sm fibroid   Left knee surgey     MENISCUS REPAIR     left knee 2010   REFRACTIVE SURGERY  02/2016   WISDOM TOOTH EXTRACTION      Current Outpatient Medications  Medication Sig Dispense Refill   Azelastine-Fluticasone 137-50 MCG/ACT SUSP SHAKE LIQUID AND USE 1 TO 2 SPRAYS IN EACH NOSTRIL TWICE DAILY AS NEEDED 23 g 12   Calcium Citrate-Vitamin D (CITRACAL + D PO) Take 1,200 mg by mouth daily.     Cholecalciferol (VITAMIN D3) 2000 units TABS Take 1 tablet by mouth daily.     levothyroxine (SYNTHROID) 25 MCG tablet TAKE 1 TABLET(25 MCG) BY MOUTH DAILY BEFORE BREAKFAST 90 tablet 3   Magnesium 250 MG TABS Take 250 mg by mouth daily.     montelukast (SINGULAIR) 10 MG tablet TAKE 1 TABLET(10 MG) BY MOUTH AT BEDTIME 90 tablet 4   Multiple Vitamins-Minerals (CENTRUM SILVER) tablet Take 1 tablet by mouth daily.     PREVIDENT 5000 BOOSTER PLUS 1.1 % PSTE 3 (three) times daily as needed.     sertraline (ZOLOFT) 25 MG tablet Take 1 tablet (25 mg total) by  mouth daily. 90 tablet 3   Zoledronic Acid (RECLAST IV) Inject into the vein. IV infusion yearly     conjugated estrogens (PREMARIN) vaginal cream USE 0.5 GRAM VAGINALLY 2 TIMES A WEEK (Patient not taking: Reported on 04/17/2022) 30 g 1   cromolyn (NASALCROM) 5.2 MG/ACT nasal spray Place 1 spray into both nostrils once for 1 dose. Taking once in the morning 06/19 26 mL 12   Omega-3 Fatty Acids (FISH OIL) 1200 MG CAPS Take by mouth daily.     No current facility-administered medications for this visit.    Family History  Problem Relation Age of Onset   Alzheimer's disease Mother    Allergies Mother    Breast cancer Mother 84   Dementia Mother    Lung cancer Father 30   Allergies Brother    Diabetes Brother    Transient ischemic attack Maternal Grandmother    Peripheral Artery Disease Maternal Grandmother        s/p B leg amputation   Stroke Maternal Grandmother    Tuberculosis  Maternal Grandfather    Diabetes Brother 65   Alzheimer's disease Brother 64       early onset   Sudden death Paternal Grandmother 70       unknown   Breast cancer Paternal Aunt    Osteoporosis Neg Hx     Review of Systems  All other systems reviewed and are negative.   Exam:   BP 136/80 (BP Location: Right Arm, Patient Position: Sitting, Cuff Size: Normal)   Pulse 72   Ht 5' 4.5" (1.638 m)   Wt 113 lb (51.3 kg)   LMP 09/03/1996   BMI 19.10 kg/m     General appearance: alert, cooperative and appears stated age Head: normocephalic, without obvious abnormality, atraumatic Neck: no adenopathy, supple, symmetrical, trachea midline and thyroid normal to inspection and palpation Lungs: clear to auscultation bilaterally Breasts: normal appearance, no masses or tenderness, No nipple retraction or dimpling, No nipple discharge or bleeding, No axillary adenopathy Heart: regular rate and rhythm Abdomen: soft, non-tender; no masses, no organomegaly Extremities: extremities normal, atraumatic, no cyanosis or edema Skin: skin color, texture, turgor normal. No rashes or lesions Lymph nodes: cervical, supraclavicular, and axillary nodes normal. Neurologic: grossly normal  Pelvic: External genitalia:  no lesions              No abnormal inguinal nodes palpated.              Urethra:  normal appearing urethra with no masses, tenderness or lesions              Bartholins and Skenes: normal                 Vagina: normal appearing vagina with normal color and discharge, no lesions              Cervix: no lesions              Pap taken: {yes no:314532} Bimanual Exam:  Uterus:  normal size, contour, position, consistency, mobility, non-tender              Adnexa: no mass, fullness, tenderness              Rectal exam: {yes no:314532}.  Confirms.              Anus:  normal sphincter tone, no lesions  Chaperone was present for exam:  ***  Assessment:    Overactive bladder.   Off meds.  Hx  incomplete uterovaginal prolapse. Osteoporosis.  On Reclast.  Hypercalcemia. Compression fracture of thoracic/lumbar region. Vulvovaginal atrophy.  FH of breast cancer in mother. Husband with HPV head and neck cancer.   Plan: Mammogram screening discussed. Self breast awareness reviewed. Pap and HR HPV as above. Guidelines for Calcium, Vitamin D, regular exercise program including cardiovascular and weight bearing exercise.   Follow up annually and prn.   Additional counseling given.  {yes [id number]. _______ minutes face to face time of which over 50% was spent in counseling.    After visit summary provided.

## 2022-07-16 NOTE — Patient Instructions (Signed)

## 2022-07-30 ENCOUNTER — Ambulatory Visit
Admission: RE | Admit: 2022-07-30 | Discharge: 2022-07-30 | Disposition: A | Discharge status: Home / Self Care | Payer: Medicare Other | Source: Ambulatory Visit | Attending: Internal Medicine | Admitting: Internal Medicine

## 2022-07-30 DIAGNOSIS — Z1231 Encounter for screening mammogram for malignant neoplasm of breast: Secondary | ICD-10-CM

## 2022-07-31 LAB — HM MAMMOGRAPHY

## 2022-08-06 IMAGING — CR DG CERVICAL SPINE COMPLETE 4+V
5 series · 5 of 5 positions shown · non-contrast
Comparison: None.

CLINICAL DATA: Osteoporosis.  Neck pain

EXAM:
CERVICAL SPINE - COMPLETE 4+ VIEW

[w c-spine lat]
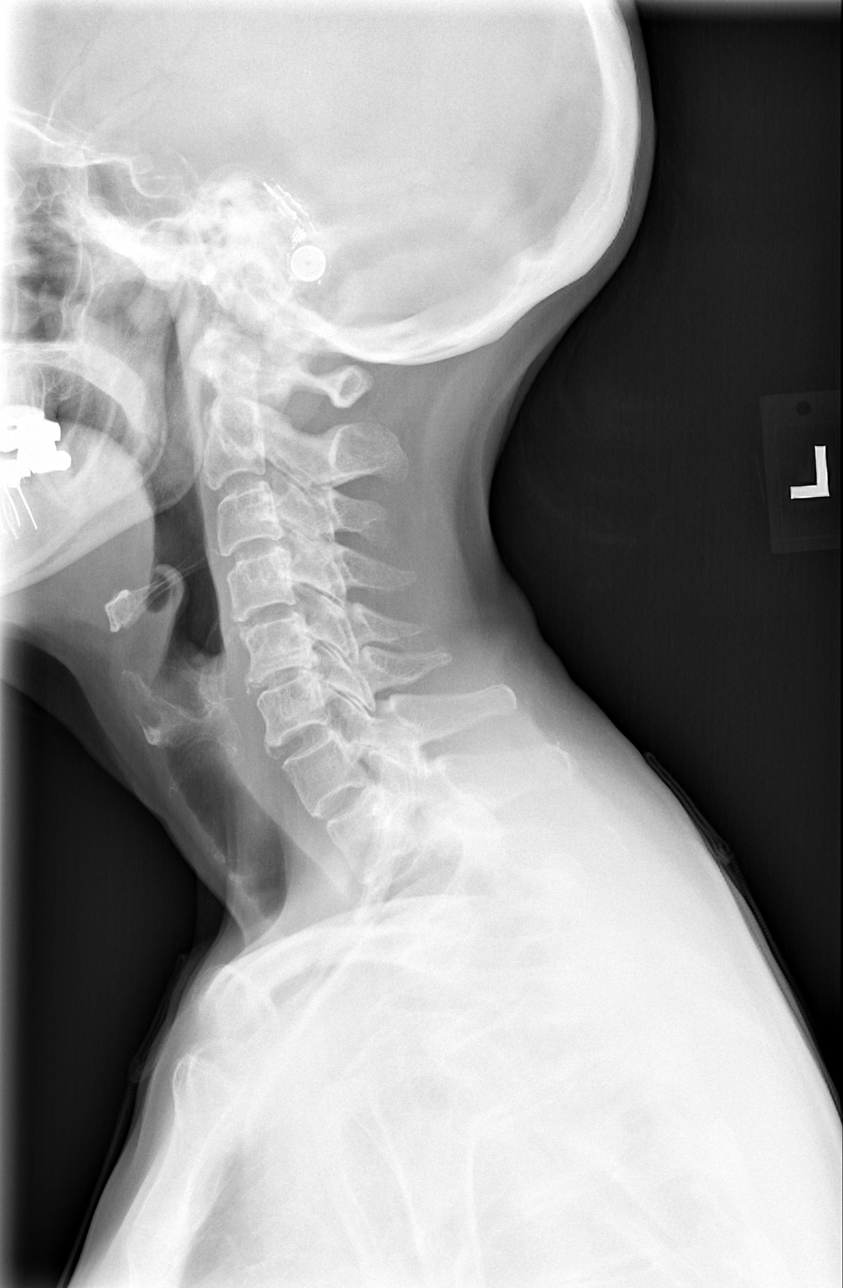

[w c-spine oblique (1 of 2)]
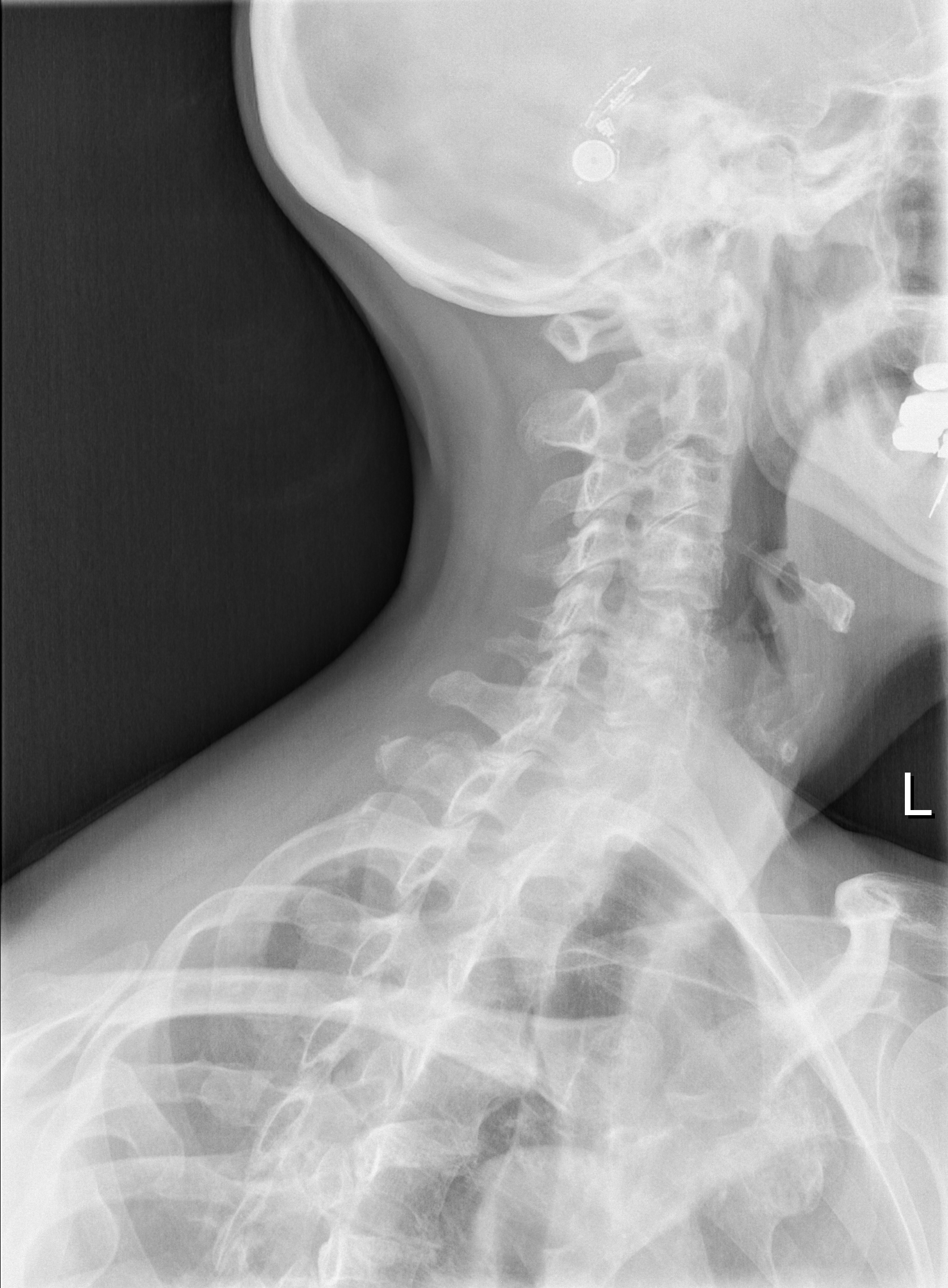

[w c-spine oblique (2 of 2)]
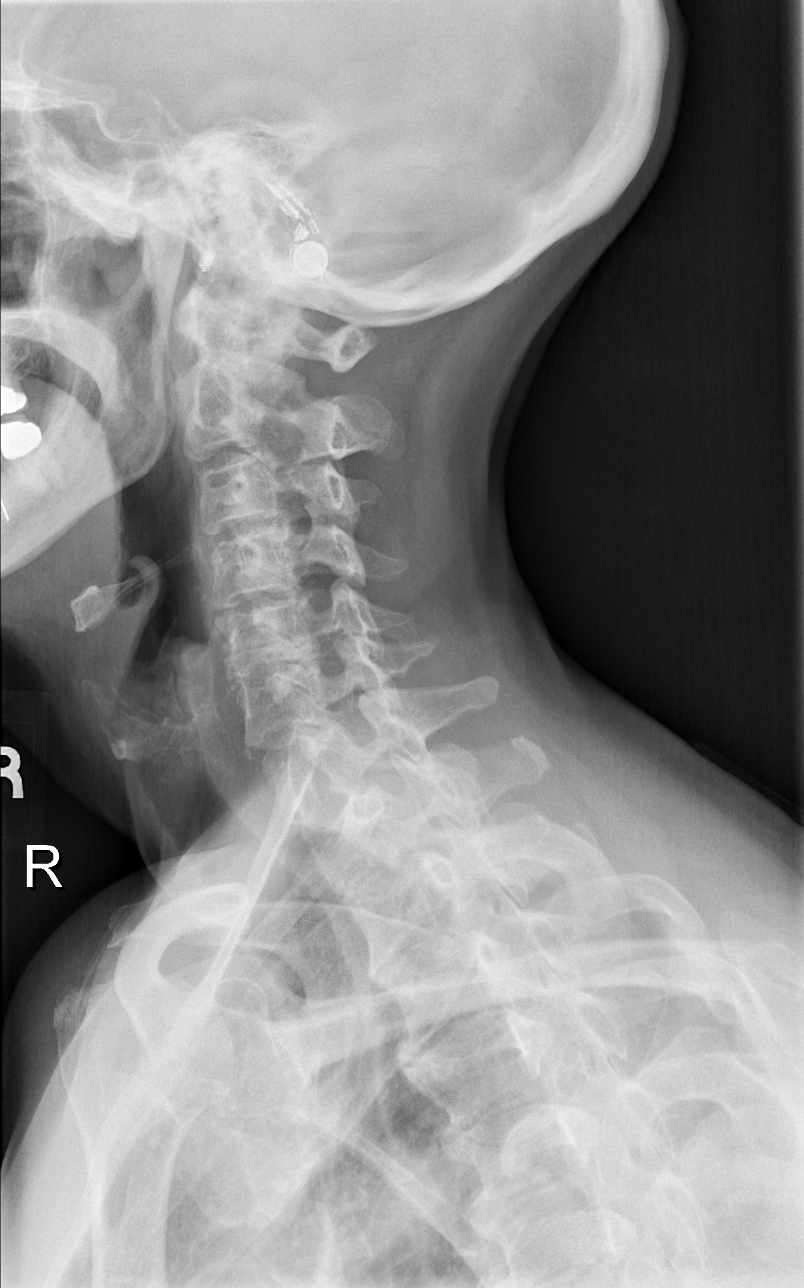

[w c-spine a.p. *]
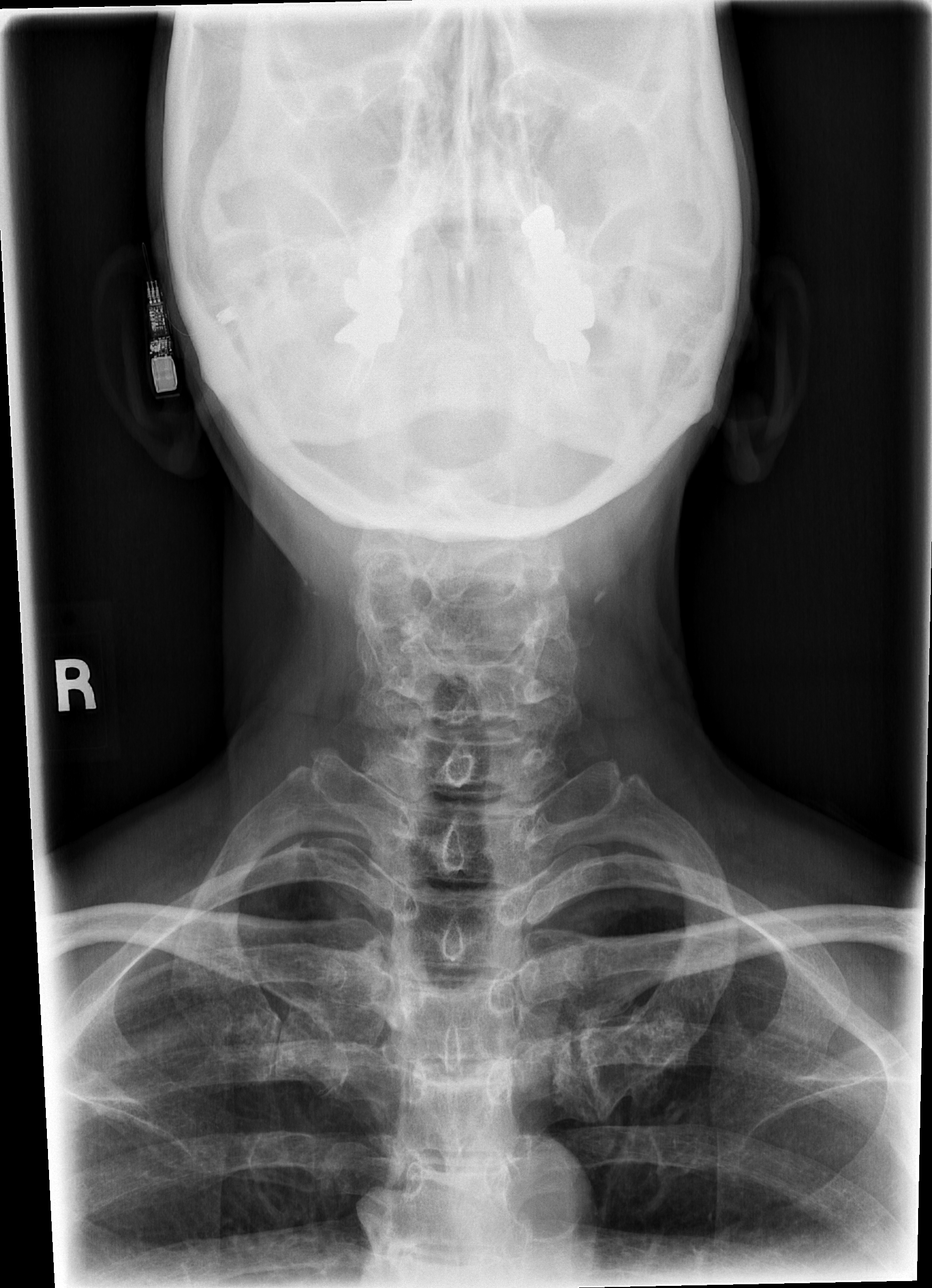

[w c-spine odontoid *]
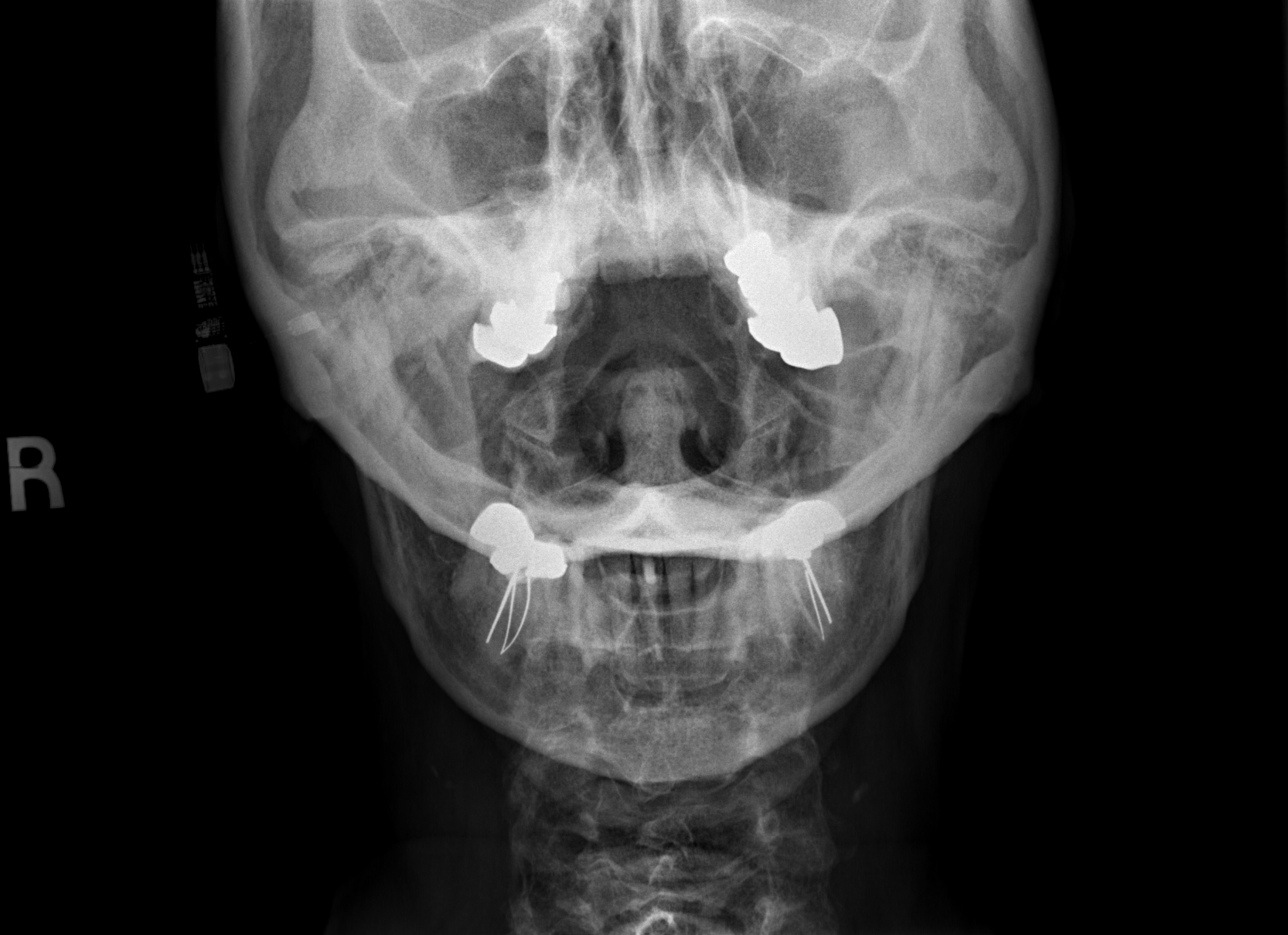

[5 of 5 positions shown; findings below may reference images not displayed]

FINDINGS: Normal alignment. No fracture or mass. Prevertebral soft tissues
normal.

Mild disc and facet degeneration throughout the cervical spine. Disc
degeneration spurring most prominent C5-6.

Moderate right foraminal narrowing at C5-6. Mild right foraminal
narrowing C3-4 and C4-5. Mild left foraminal narrowing C3-4, C4-5.
Moderate left foraminal narrowing C5-6.
IMPRESSION: Cervical spondylosis with foraminal narrowing bilaterally most
prominent at C5-6 bilaterally.

## 2022-09-11 ENCOUNTER — Ambulatory Visit: Payer: Medicare Other | Admitting: Internal Medicine

## 2022-09-14 ENCOUNTER — Ambulatory Visit (INDEPENDENT_AMBULATORY_CARE_PROVIDER_SITE_OTHER): Payer: Medicare Other | Admitting: Internal Medicine

## 2022-09-14 ENCOUNTER — Encounter: Payer: Self-pay | Admitting: Internal Medicine

## 2022-09-14 VITALS — BP 150/90 | HR 59 | Temp 97.9°F | Ht 64.0 in | Wt 115.6 lb

## 2022-09-14 DIAGNOSIS — F419 Anxiety disorder, unspecified: Secondary | ICD-10-CM | POA: Diagnosis not present

## 2022-09-14 DIAGNOSIS — F32A Depression, unspecified: Secondary | ICD-10-CM | POA: Diagnosis not present

## 2022-09-14 DIAGNOSIS — E041 Nontoxic single thyroid nodule: Secondary | ICD-10-CM | POA: Diagnosis not present

## 2022-09-14 DIAGNOSIS — J029 Acute pharyngitis, unspecified: Secondary | ICD-10-CM | POA: Diagnosis not present

## 2022-09-14 MED ORDER — SERTRALINE HCL 50 MG PO TABS: 50.0000 mg | ORAL_TABLET | Freq: Every day | ORAL | 3 refills | Status: DC

## 2022-09-14 NOTE — Progress Notes (Signed)
   Subjective:   Patient ID: Mary Malone, female    DOB: 1946/04/07, 77 y.o.   MRN: [id number]  HPI The patient is a 77 YO female coming in for concerns.   Review of Systems  Constitutional: Negative.   HENT:  Positive for sore throat.   Eyes: Negative.   Respiratory:  Negative for cough, chest tightness and shortness of breath.   Cardiovascular:  Negative for chest pain, palpitations and leg swelling.  Gastrointestinal:  Negative for abdominal distention, abdominal pain, constipation, diarrhea, nausea and vomiting.  Musculoskeletal: Negative.   Skin: Negative.   Neurological: Negative.   Psychiatric/Behavioral:  Positive for decreased concentration and dysphoric mood.     Objective:  Physical Exam Constitutional:      Appearance: She is well-developed.  HENT:     Head: Normocephalic and atraumatic.     Ears:     Comments: Mild erythema posterior oropharynx without swelling or purulence Neck:     Comments: No LAD in the neck and thyroid normal size Cardiovascular:     Rate and Rhythm: Normal rate and regular rhythm.  Pulmonary:     Effort: Pulmonary effort is normal. No respiratory distress.     Breath sounds: Normal breath sounds. No wheezing or rales.  Abdominal:     General: Bowel sounds are normal. There is no distension.     Palpations: Abdomen is soft.     Tenderness: There is no abdominal tenderness. There is no rebound.  Musculoskeletal:     Cervical back: Normal range of motion.  Skin:    General: Skin is warm and dry.  Neurological:     Mental Status: She is alert and oriented to person, place, and time.     Coordination: Coordination normal.     Vitals:   09/14/22 1038  BP: (!) 150/90  Pulse: (!) 59  Temp: 97.9 F (36.6 C)  TempSrc: Oral  SpO2: 95%  Weight: 115 lb 9.6 oz (52.4 kg)  Height: '5\' 4"'$  (1.626 m)    Assessment & Plan:

## 2022-09-14 NOTE — Assessment & Plan Note (Signed)
Prior US 2017 reviewed and no concerning nodules noted. She did have 1 nodule. She is having new swallowing problems which my be related to recent sore throat. If this does not resolve in 1-2 weeks we will order repeat US thyroid.

## 2022-09-14 NOTE — Assessment & Plan Note (Signed)
Unclear etiology as she denies other viral symptoms. No signs of infection on exam or LAD in the neck. She could have had GERD flare. She is now having some difficulty with food/pills getting stuck. We will give this another week or two to improve and if no improvement she will resume pepcid daily to see if this helps.

## 2022-09-14 NOTE — Patient Instructions (Signed)
We will increase the dose of the zoloft (sertraline) to 50 mg daily. We have sent in a new dose today.

## 2022-09-14 NOTE — Assessment & Plan Note (Signed)
Depression is not improving. Will increase zoloft to 50 mg daily and follow up 1 month. She is seeing psych and thinking of getting magnetic treatment but needs to try 2 different medications before this will be covered. She may need to try effexor next before she qualifies.

## 2023-01-25 ENCOUNTER — Encounter: Payer: Self-pay | Admitting: Ophthalmology

## 2023-01-27 ENCOUNTER — Other Ambulatory Visit: Payer: Self-pay | Admitting: Internal Medicine

## 2023-01-29 ENCOUNTER — Other Ambulatory Visit: Payer: Self-pay | Admitting: Ophthalmology

## 2023-01-29 DIAGNOSIS — H532 Diplopia: Secondary | ICD-10-CM

## 2023-02-12 ENCOUNTER — Ambulatory Visit (INDEPENDENT_AMBULATORY_CARE_PROVIDER_SITE_OTHER): Payer: Medicare Other

## 2023-02-12 VITALS — Ht 64.5 in | Wt 120.0 lb

## 2023-02-12 DIAGNOSIS — Z0000 Encounter for general adult medical examination without abnormal findings: Secondary | ICD-10-CM | POA: Diagnosis not present

## 2023-02-12 NOTE — Patient Instructions (Addendum)
Mary Malone , Thank you for taking time to come for your Medicare Wellness Visit. I appreciate your ongoing commitment to your health goals. Please review the following plan we discussed and let me know if I can assist you in the future.   These are the goals we discussed:  Goals       No current goals (pt-stated)      Patient declined health goal at this time.      Patient Stated      Continue to take time for myself and relax by doing things that bring me joy.         This is a list of the screening recommended for you and due dates:  Health Maintenance  Topic Date Due   COVID-19 Vaccine (5 - 2023-24 season) 02/28/2023*   Flu Shot  04/04/2023   Medicare Annual Wellness Visit  02/12/2024   Colon Cancer Screening  06/16/2024   DTaP/Tdap/Td vaccine (3 - Td or Tdap) 01/30/2033   Pneumonia Vaccine  Completed   DEXA scan (bone density measurement)  Completed   Hepatitis C Screening  Completed   Zoster (Shingles) Vaccine  Completed   HPV Vaccine  Aged Out  *Topic was postponed. The date shown is not the original due date.    Advanced directives: Please bring a copy of your health care power of attorney and living will to the office to be added to your chart at your convenience.   Conditions/risks identified: None  Next appointment: Follow up in one year for your annual wellness visit    Preventive Care 65 Years and Older, Female Preventive care refers to lifestyle choices and visits with your health care provider that can promote health and wellness. What does preventive care include? A yearly physical exam. This is also called an annual well check. Dental exams once or twice a year. Routine eye exams. Ask your health care provider how often you should have your eyes checked. Personal lifestyle choices, including: Daily care of your teeth and gums. Regular physical activity. Eating a healthy diet. Avoiding tobacco and drug use. Limiting alcohol use. Practicing safe  sex. Taking low-dose aspirin every day. Taking vitamin and mineral supplements as recommended by your health care provider. What happens during an annual well check? The services and screenings done by your health care provider during your annual well check will depend on your age, overall health, lifestyle risk factors, and family history of disease. Counseling  Your health care provider may ask you questions about your: Alcohol use. Tobacco use. Drug use. Emotional well-being. Home and relationship well-being. Sexual activity. Eating habits. History of falls. Memory and ability to understand (cognition). Work and work Astronomer. Reproductive health. Screening  You may have the following tests or measurements: Height, weight, and BMI. Blood pressure. Lipid and cholesterol levels. These may be checked every 5 years, or more frequently if you are over 42 years old. Skin check. Lung cancer screening. You may have this screening every year starting at age 61 if you have a 30-pack-year history of smoking and currently smoke or have quit within the past 15 years. Fecal occult blood test (FOBT) of the stool. You may have this test every year starting at age 65. Flexible sigmoidoscopy or colonoscopy. You may have a sigmoidoscopy every 5 years or a colonoscopy every 10 years starting at age 66. Hepatitis C blood test. Hepatitis B blood test. Sexually transmitted disease (STD) testing. Diabetes screening. This is done by checking your blood sugar (  glucose) after you have not eaten for a while (fasting). You may have this done every 1-3 years. Bone density scan. This is done to screen for osteoporosis. You may have this done starting at age 73. Mammogram. This may be done every 1-2 years. Talk to your health care provider about how often you should have regular mammograms. Talk with your health care provider about your test results, treatment options, and if necessary, the need for more  tests. Vaccines  Your health care provider may recommend certain vaccines, such as: Influenza vaccine. This is recommended every year. Tetanus, diphtheria, and acellular pertussis (Tdap, Td) vaccine. You may need a Td booster every 10 years. Zoster vaccine. You may need this after age 32. Pneumococcal 13-valent conjugate (PCV13) vaccine. One dose is recommended after age 57. Pneumococcal polysaccharide (PPSV23) vaccine. One dose is recommended after age 50. Talk to your health care provider about which screenings and vaccines you need and how often you need them. This information is not intended to replace advice given to you by your health care provider. Make sure you discuss any questions you have with your health care provider. Document Released: 09/16/2015 Document Revised: 05/09/2016 Document Reviewed: 06/21/2015 Elsevier Interactive Patient Education  2017 [place] Prevention in the Home Falls can cause injuries. They can happen to people of all ages. There are many things you can do to make your home safe and to help prevent falls. What can I do on the outside of my home? Regularly fix the edges of walkways and driveways and fix any cracks. Remove anything that might make you trip as you walk through a door, such as a raised step or threshold. Trim any bushes or trees on the path to your home. Use bright outdoor lighting. Clear any walking paths of anything that might make someone trip, such as rocks or tools. Regularly check to see if handrails are loose or broken. Make sure that both sides of any steps have handrails. Any raised decks and porches should have guardrails on the edges. Have any leaves, snow, or ice cleared regularly. Use sand or salt on walking paths during winter. Clean up any spills in your garage right away. This includes oil or grease spills. What can I do in the bathroom? Use night lights. Install grab bars by the toilet and in the tub and shower.  Do not use towel bars as grab bars. Use non-skid mats or decals in the tub or shower. If you need to sit down in the shower, use a plastic, non-slip stool. Keep the floor dry. Clean up any water that spills on the floor as soon as it happens. Remove soap buildup in the tub or shower regularly. Attach bath mats securely with double-sided non-slip rug tape. Do not have throw rugs and other things on the floor that can make you trip. What can I do in the bedroom? Use night lights. Make sure that you have a light by your bed that is easy to reach. Do not use any sheets or blankets that are too big for your bed. They should not hang down onto the floor. Have a firm chair that has side arms. You can use this for support while you get dressed. Do not have throw rugs and other things on the floor that can make you trip. What can I do in the kitchen? Clean up any spills right away. Avoid walking on wet floors. Keep items that you use a lot in easy-to-reach places.  If you need to reach something above you, use a strong step stool that has a grab bar. Keep electrical cords out of the way. Do not use floor polish or wax that makes floors slippery. If you must use wax, use non-skid floor wax. Do not have throw rugs and other things on the floor that can make you trip. What can I do with my stairs? Do not leave any items on the stairs. Make sure that there are handrails on both sides of the stairs and use them. Fix handrails that are broken or loose. Make sure that handrails are as long as the stairways. Check any carpeting to make sure that it is firmly attached to the stairs. Fix any carpet that is loose or worn. Avoid having throw rugs at the top or bottom of the stairs. If you do have throw rugs, attach them to the floor with carpet tape. Make sure that you have a light switch at the top of the stairs and the bottom of the stairs. If you do not have them, ask someone to add them for you. What else  can I do to help prevent falls? Wear shoes that: Do not have high heels. Have rubber bottoms. Are comfortable and fit you well. Are closed at the toe. Do not wear sandals. If you use a stepladder: Make sure that it is fully opened. Do not climb a closed stepladder. Make sure that both sides of the stepladder are locked into place. Ask someone to hold it for you, if possible. Clearly mark and make sure that you can see: Any grab bars or handrails. First and last steps. Where the edge of each step is. Use tools that help you move around (mobility aids) if they are needed. These include: Canes. Walkers. Scooters. Crutches. Turn on the lights when you go into a dark area. Replace any light bulbs as soon as they burn out. Set up your furniture so you have a clear path. Avoid moving your furniture around. If any of your floors are uneven, fix them. If there are any pets around you, be aware of where they are. Review your medicines with your doctor. Some medicines can make you feel dizzy. This can increase your chance of falling. Ask your doctor what other things that you can do to help prevent falls. This information is not intended to replace advice given to you by your health care provider. Make sure you discuss any questions you have with your health care provider. Document Released: 06/16/2009 Document Revised: 01/26/2016 Document Reviewed: 09/24/2014 Elsevier Interactive Patient Education  2017 [employer].

## 2023-02-12 NOTE — Progress Notes (Signed)
Subjective:   Mary Malone is a 76 y.o. female who presents for Medicare Annual (Subsequent) preventive examination.  Review of Systems    Virtual Visit via Telephone Note  I connected with  Mary Malone on 02/12/23 at 10:00 AM EDT by telephone and verified that I am speaking with the correct person using two identifiers.  Location: Patient: Home Provider: Office Persons participating in the virtual visit: patient/Nurse Health Advisor   I discussed the limitations, risks, security and privacy concerns of performing an evaluation and management service by telephone and the availability of in person appointments. The patient expressed understanding and agreed to proceed.  Interactive audio and video telecommunications were attempted between this nurse and patient, however failed, due to patient having technical difficulties OR patient did not have access to video capability.  We continued and completed visit with audio only.  Some vital signs may be absent or patient reported.   Tillie Rung, LPN  Cardiac Risk Factors include: advanced age (>33men, >50 women)     Objective:    Today's Vitals   02/12/23 1003  Weight: 120 lb (54.4 kg)  Height: 5' 4.5" (1.638 m)   Body mass index is 20.28 kg/m.     02/12/2023   10:17 AM 02/12/2022    3:06 PM 02/01/2021   11:53 AM 11/09/2020    9:47 AM 06/18/2018    9:16 AM 02/17/2015    2:09 PM  Advanced Directives  Does Patient Have a Medical Advance Directive? Yes Yes No Yes Yes Yes  Type of Estate agent of [place];Living will Living will;Healthcare Power of [employer] Power of [place];Living will Healthcare Power of [place];Living will Healthcare Power of [place];Living will  Does patient want to make changes to medical advance directive?  No - Patient declined  No - Patient declined    Copy of Healthcare Power of Attorney in Chart? No - copy requested No - copy requested  No - copy requested No -  copy requested   Would patient like information on creating a medical advance directive?   No - Patient declined       Current Medications (verified) Outpatient Encounter Medications as of 02/12/2023  Medication Sig   Azelastine-Fluticasone 137-50 MCG/ACT SUSP SHAKE LIQUID AND USE 1 TO 2 SPRAYS IN EACH NOSTRIL TWICE DAILY AS NEEDED   Calcium Citrate-Vitamin D (CITRACAL + D PO) Take 1,200 mg by mouth daily.   Cholecalciferol (VITAMIN D3) 2000 units TABS Take 1 tablet by mouth daily.   conjugated estrogens (PREMARIN) vaginal cream USE 0.5 GRAM VAGINALLY 2 TIMES A WEEK   cromolyn (NASALCROM) 5.2 MG/ACT nasal spray Place 1 spray into both nostrils once for 1 dose. Taking once in the morning 06/19   levothyroxine (SYNTHROID) 25 MCG tablet TAKE 1 TABLET(25 MCG) BY MOUTH DAILY BEFORE BREAKFAST   Magnesium 250 MG TABS Take 250 mg by mouth daily.   montelukast (SINGULAIR) 10 MG tablet TAKE 1 TABLET(10 MG) BY MOUTH AT BEDTIME   Multiple Vitamins-Minerals (CENTRUM SILVER) tablet Take 1 tablet by mouth daily.   PREVIDENT 5000 BOOSTER PLUS 1.1 % PSTE 3 (three) times daily as needed.   sertraline (ZOLOFT) 50 MG tablet TAKE 1 TABLET BY MOUTH DAILY   Zoledronic Acid (RECLAST IV) Inject into the vein. IV infusion yearly   No facility-administered encounter medications on file as of 02/12/2023.    Allergies (verified) Patient has no known allergies.   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   Osteoporosis    PMB (postmenopausal bleeding) 12/2001   polyps   Serum calcium elevated 2021   sees Dr.Ellison/pulmonology with [place] [facility]   Thyroid disease    hypothyroidism   URINARY  INCONTINENCE    OAB   Wisdom teeth removed 1866/1967   Past Surgical History:  Procedure Laterality Date   BREAST CYST ASPIRATION  12/05/2001   CATARACT EXTRACTION Bilateral 07/2012, 09/2012   DILATION AND CURETTAGE OF UTERUS  2003   HYSTEROSCOPY  01/2002   resection, sm fibroid   Left knee surgey     MENISCUS REPAIR     left knee 2010   REFRACTIVE SURGERY  02/2016   WISDOM TOOTH EXTRACTION     Family History  Problem Relation Age of Onset   Alzheimer's disease Mother    Allergies Mother    Breast cancer Mother 62   Dementia Mother    Lung cancer Father 48   Allergies Brother    Diabetes Brother    Transient ischemic attack Maternal Grandmother    Peripheral Artery Disease Maternal Grandmother        s/p B leg amputation   Stroke Maternal Grandmother    Tuberculosis Maternal Grandfather    Diabetes Brother 62   Alzheimer's disease Brother 107       early onset   Sudden death Paternal Grandmother 30       unknown   Breast cancer Paternal Aunt    Osteoporosis Neg Hx    Social History   Socioeconomic History   Marital status: Widowed    Spouse name: Not on file   Number of children: Not on file   Years of education: Not on file   Highest education level: Not on file  Occupational History   Not on file  Tobacco Use   Smoking status: Never   Smokeless tobacco: Never  Vaping Use   Vaping Use: Never used  Substance and Sexual Activity   Alcohol use: No    Alcohol/week: 1.0 standard drink of alcohol    Types: 1 Standard drinks or equivalent per week   Drug use: No   Sexual activity: Not Currently    Partners: Male    Birth control/protection: Post-menopausal  Other Topics Concern   Not on file  Social History Narrative   Not on file   Social Determinants of Health   Financial Resource Strain: Low Risk  (02/12/2023)   Overall Financial Resource Strain (CARDIA)    Difficulty of Paying Living Expenses: Not hard at all  Food Insecurity: No Food Insecurity  (02/12/2023)   Hunger Vital Sign    Worried About Running Out of Food in the Last Year: Never true    Ran Out of Food in the Last Year: Never true  Transportation Needs: No Transportation Needs (02/12/2023)   PRAPARE - Administrator, Civil Service (Medical): No    Lack of Transportation (Non-Medical): No  Physical Activity: Insufficiently Active (02/12/2023)   Exercise Vital Sign    Days of Exercise per Week: 3 days    Minutes of  Exercise per Session: 40 min  Stress: No Stress Concern Present (02/12/2023)   [employer] of Occupational Health - Occupational Stress Questionnaire    Feeling of Stress : Only a little  Social Connections: Socially Isolated (02/12/2023)   Social Connection and Isolation Panel [NHANES]    Frequency of Communication with Friends and Family: More than three times a week    Frequency of Social Gatherings with Friends and Family: More than three times a week    Attends Religious Services: Never    Database administrator or Organizations: No    Attends Banker Meetings: Never    Marital Status: Widowed    Tobacco Counseling Counseling given: Not Answered   Clinical Intake:  Pre-visit preparation completed: No  Pain : No/denies pain     BMI - recorded: 20.28 Nutritional Status: BMI of 19-24  Normal Nutritional Risks: None Diabetes: No  How often do you need to have someone help you when you read instructions, pamphlets, or other written materials from your doctor or pharmacy?: 1 - Never  Diabetic?  No  Interpreter Needed?: No  Information entered by :: Theresa Mulligan LPN   Activities of Daily Living    02/12/2023   10:15 AM 02/06/2023    9:41 AM  In your present state of health, do you have any difficulty performing the following activities:  Hearing? 0 0  Vision? 0 0  Difficulty concentrating or making decisions? 0 0  Walking or climbing stairs? 0 0  Dressing or bathing? 0 0  Doing errands, shopping? 0 0   Preparing Food and eating ? N   Using the Toilet? N N  In the past six months, have you accidently leaked urine? Malvin Johns  Comment Wears pads. Followed by PCP   Do you have problems with loss of bowel control? N N  Managing your Medications? N N  Managing your Finances? N N  Housekeeping or managing your Housekeeping? N N    Patient Care Team: Myrlene Broker, MD as PCP - General (Internal Medicine) August Saucer, Corrie Mckusick, MD (Orthopedic Surgery) Waymon Budge, MD (Pulmonary Disease) Charna Elizabeth, MD (Gastroenterology) Jethro Bolus, MD (Ophthalmology) Ardell Isaacs, Forrestine Him, MD (Obstetrics and Gynecology) Micki Riley, MD as Consulting Physician (Neurology) Romero Belling, MD (Inactive) as Consulting Physician (Endocrinology) Lonie Peak, MD as Consulting Physician (Radiation Oncology) Malmfelt, Lise Auer, RN as Oncology Nurse Navigator  Indicate any recent Medical Services you may have received from other than [facility] providers in the past year (date may be approximate).     Assessment:   This is a routine wellness examination for [place].  Hearing/Vision screen Hearing Screening - Comments:: Wears rt ear hearing aide Vision Screening - Comments:: Wears rx glasses - up to date with routine eye exams with  [facility] [place]  Dietary issues and exercise activities discussed: Exercise limited by: None identified   Goals Addressed               This Visit's Progress     No current goals (pt-stated)         Depression Screen    02/12/2023   10:12 AM 09/14/2022   10:36 AM 02/12/2022    3:10 PM 12/01/2021    2:29 PM 06/18/2018    9:17 AM 06/11/2017    1:33 PM 02/13/2016    1:04 PM  PHQ 2/9 Scores  PHQ - 2 Score 2 3 2  0 2 0 0  PHQ- 9 Score 3 11  4      Fall Risk    02/12/2023   10:17 AM 02/06/2023    9:41 AM 09/14/2022   10:36 AM 02/12/2022    3:20 PM 02/03/2021    2:23 PM  Fall Risk   Falls in the past year? 0 0 0 0 0  Number falls in past yr: 0  0 0 0   Injury with Fall? 0  0 0 0  Risk for fall due to : No Fall Risks  No Fall Risks No Fall Risks   Follow up Falls prevention discussed  Falls evaluation completed Falls evaluation completed     FALL RISK PREVENTION PERTAINING TO THE HOME:  Any stairs in or around the home? No  If so, are there any without handrails? No  Home free of loose throw rugs in walkways, pet beds, electrical cords, etc? Yes  Adequate lighting in your home to reduce risk of falls? Yes   ASSISTIVE DEVICES UTILIZED TO PREVENT FALLS:  Life alert? No  Use of a cane, walker or w/c? No  Grab bars in the bathroom? Yes  Shower chair or bench in shower? No  Elevated toilet seat or a handicapped toilet? Yes   TIMED UP AND GO:  Was the test performed? No . Audio Visit   Cognitive Function:    02/12/2022    3:21 PM 06/18/2018    9:45 AM  MMSE - Mini Mental State Exam  Not completed: Unable to complete Refused        02/12/2023   10:17 AM 02/12/2022    3:22 PM  6CIT Screen  What Year? 0 points 0 points  What month? 0 points 0 points  What time? 0 points 0 points  Count back from 20 0 points 0 points  Months in reverse 0 points 0 points  Repeat phrase 0 points 0 points  Total Score 0 points 0 points    Immunizations Immunization History  Administered Date(s) Administered   Influenza Split 05/14/2013   Influenza Whole 05/09/2011   Influenza, High Dose Seasonal PF 06/17/2017, 06/12/2018, 05/08/2019, 05/08/2019, 06/05/2020   Influenza,inj,Quad PF,6+ Mos 05/04/2014   Influenza-Unspecified 05/15/2012, 05/19/2012, 06/04/2015, 06/14/2016, 06/12/2018, 05/08/2019, 05/16/2021, 05/25/2022   Moderna SARS-COV2 Booster Vaccination 06/09/2022   PFIZER Comirnaty(Gray Top)Covid-19 Tri-Sucrose Vaccine 12/21/2020   PFIZER(Purple Top)SARS-COV-2 Vaccination 10/09/2019, 10/30/2019, 05/03/2020   Pneumococcal Conjugate-13 02/10/2015   Pneumococcal Polysaccharide-23 09/22/2008, 02/19/2017   RSV,unspecified 07/20/2022   Td  01/28/2013   Tdap 01/31/2023   Zoster Recombinat (Shingrix) 05/31/2021, 11/23/2021    TDAP : Up to date  Flu Vaccine status: Up to date  Pneumococcal vaccine status: Up to date  Covid-19 vaccine status: Completed vaccines  Qualifies for Shingles Vaccine? Yes   Zostavax completed Yes   Shingrix Completed?: Yes  Screening Tests Health Maintenance  Topic Date Due   COVID-19 Vaccine (5 - 2023-24 season) 02/28/2023 (Originally 08/04/2022)   INFLUENZA VACCINE  04/04/2023   Medicare Annual Wellness (AWV)  02/12/2024   Colonoscopy  06/16/2024   DTaP/Tdap/Td (3 - Td or Tdap) 01/30/2033   Pneumonia Vaccine 9+ Years old  Completed   DEXA SCAN  Completed   Hepatitis C Screening  Completed   Zoster Vaccines- Shingrix  Completed   HPV VACCINES  Aged Out    Health Maintenance  There are no preventive care reminders to display for this patient.   Colorectal cancer screening: Type of screening: Colonoscopy. Completed 06/16/14. Repeat every 10 years  Mammogram status: No longer required due to Age.  Bone Density status: Completed 06/15/21. Results reflect: Bone density results: OSTEOPOROSIS. Repeat every   years.  Lung Cancer Screening: (Low Dose CT Chest recommended if Age 82-80 years, 30 pack-year currently smoking OR have quit w/in 15years.) does not qualify.    Additional Screening:  Hepatitis C Screening: does qualify; Completed 02/13/16  Vision Screening: Recommended annual ophthalmology exams for early detection of glaucoma and other disorders of the eye. Is the patient up to date with their annual eye exam?  Yes  Who is the provider or what is the name of the office in which the patient attends annual eye exams? [facility] If pt is not established with a provider, would they like to be referred to a provider to establish care? No .   Dental Screening: Recommended annual dental exams for proper oral hygiene  Community Resource Referral / Chronic Care  Management:  CRR required this visit?  No   CCM required this visit?  No      Plan:     I have personally reviewed and noted the following in the patient's chart:   Medical and social history Use of alcohol, tobacco or illicit drugs  Current medications and supplements including opioid prescriptions. Patient is not currently taking opioid prescriptions. Functional ability and status Nutritional status Physical activity Advanced directives List of other physicians Hospitalizations, surgeries, and ER visits in previous 12 months Vitals Screenings to include cognitive, depression, and falls Referrals and appointments  In addition, I have reviewed and discussed with patient certain preventive protocols, quality metrics, and best practice recommendations. A written personalized care plan for preventive services as well as general preventive health recommendations were provided to patient.     Tillie Rung, LPN   [id number]/61/0960   Nurse Notes: None

## 2023-03-12 ENCOUNTER — Telehealth: Payer: Self-pay

## 2023-03-12 ENCOUNTER — Telehealth: Payer: Self-pay | Admitting: Urgent Care

## 2023-03-12 MED ORDER — TRIAZOLAM 0.25 MG PO TABS: ORAL_TABLET | ORAL | 0 refills | Status: DC

## 2023-03-12 NOTE — Telephone Encounter (Signed)
Duplicate message. 

## 2023-03-12 NOTE — Telephone Encounter (Signed)
Pt has MRI scheduled for Friday. Called in requesting medication to help with her severe claustrophobia. Pt was instructed to that 1/2 -1 tab of triazolam 0.25mg  2 hours prior, and another 1 hour prior if still not relaxed. Pt also instructed to have someone drive her to and from the imaging procedure to ensure to adverse reactions from the medication.

## 2023-03-13 NOTE — Telephone Encounter (Signed)
Pt wanted to know that since she is already taking an anti depressant how will this work with her taking the benzo?

## 2023-03-15 ENCOUNTER — Ambulatory Visit: Admission: RE | Admit: 2023-03-15 | Payer: Medicare Other | Source: Ambulatory Visit

## 2023-03-15 ENCOUNTER — Ambulatory Visit
Admission: RE | Admit: 2023-03-15 | Discharge: 2023-03-15 | Disposition: A | Discharge status: Home / Self Care | Payer: Medicare Other | Source: Ambulatory Visit | Attending: Ophthalmology | Admitting: Ophthalmology

## 2023-03-15 DIAGNOSIS — H532 Diplopia: Secondary | ICD-10-CM

## 2023-03-15 MED ORDER — GADOPICLENOL 0.5 MMOL/ML IV SOLN
5.0000 mL | Freq: Once | INTRAVENOUS | Status: AC | PRN
  Administered 2023-03-15: 5 mL via INTRAVENOUS

## 2023-03-15 NOTE — Telephone Encounter (Signed)
Spoke with patient and went over advise from the PA and pt verbalized that she understood.

## 2023-03-16 NOTE — Progress Notes (Unsigned)
HPI female never smoker (widow-husband died of lung cancer) followed for allergic rhinitis, complicated by GERD IgE total 02/20/16- 7 EOS 0.2 Allergy Profile 05/12/15- negative with minor elevation only for cat dander, total IgE 12 ---------------------------------------------------------------------------------------------------   03/15/22- 77 year old female never smoker (widow-1st husband died of lung cancer) followed for lung nodule, allergic rhinitis, complicated by GERD, hearing loss, Anxiety/Depression,  CT in 2018 was negative. -Nasalcrom, Singulair, Astelin-fluticasone, Covid vax- 4Phizer -------Patient is having a little shortness breath and pain in upper right lung a few months ago. Notices it mostly with activity. And having some ankle swelling. No cough Continue Singulair and nasal sprays.  Some dyspnea on exertion without cough or acute event.  Intermittent pains around the right scapula, pattern not really clear but seems to be related to use of arm. Aware of depression and has started treatment for this.  03/18/23- 77 year old female never smoker (widowed twice-1st husband died of lung cancer) followed for lung nodule, allergic rhinitis, complicated by GERD, hearing loss, Anxiety/Depression,  CT in 2018 was negative. -Nasalcrom, Singulair, Astelin-fluticasone, -----Breathing has been good Feels "tired" and implies this partly relates to death of second husband earlier this year.  Component of depression likely. Aware of active GERD  Associates this with occasional cough.  She was avoiding acid blockers because of osteoporosis.  I suggested trial of Pepcid and asked her to discuss with Dr. Okey Dupre. Continues to manage rhinitis with Dymista and Nasalcrom. CXR 03/15/22- MPRESSION: No active cardiopulmonary disease.   ROS-see HPI  + = positive Constitutional:   No-   weight loss, night sweats, fevers, chills, fatigue, lassitude. HEENT:   No-  headaches, difficulty swallowing,  tooth/dental problems, sore throat,        sneezing, itching, ear ache, nasal congestion, +post nasal drip,  CV:  No-   chest pain, orthopnea, PND, swelling in lower extremities, anasarca,  dizziness, palpitations Resp: No-   shortness of breath with exertion or at rest.              No-   productive cough,   non-productive cough,  No- coughing up of blood.              No-   change in color of mucus.  No- wheezing.   Skin: No-   rash or lesions. GI:    heartburn, indigestion,no- abdominal pain, nausea, vomiting,  GU:  MS:  No-   joint pain or swelling.  +pains R scapula Neuro-     nothing unusual Psych:  No- change in mood or affect. + depression or anxiety.  No memory loss.  OBJ- Physical Exam General- Alert, Oriented, Affect-appropriate, Distress- none acute, trim Skin- rash-none, lesions- none, excoriation- none , +pale Lymphadenopathy- none Head- atraumatic            Eyes- Gross vision intact, PERRLA, conjunctivae and secretions clear            Ears- no problem with face-to-face conversation in quiet exam room. +R tube.             Nose- clear, no-Septal dev, mucus, polyps, erosion, perforation             Throat- Mallampati IV , mucosa clear , drainage- none, tonsils- atrophic Neck- flexible , trachea midline, no stridor , thyroid nl, carotid no bruit Chest - symmetrical excursion , unlabored           Heart/CV- RRR , no murmur , no gallop  , no rub, nl s1 s2                           -  JVD- none , edema- none, stasis changes- none, varices- none           Lung- clear to P&A, wheeze- none, cough- none , dullness-none, rub- none           Chest wall-  Abd-  Br/ Gen/ Rectal- Not done, not indicated Extrem- cyanosis- none, clubbing, none, atrophy- none, strength- nl Neuro- grossly intact to observation

## 2023-03-18 ENCOUNTER — Encounter: Payer: Self-pay | Admitting: Internal Medicine

## 2023-03-18 ENCOUNTER — Ambulatory Visit: Payer: Medicare Other | Admitting: Internal Medicine

## 2023-03-18 DIAGNOSIS — J3089 Other allergic rhinitis: Secondary | ICD-10-CM

## 2023-03-18 DIAGNOSIS — K219 Gastro-esophageal reflux disease without esophagitis: Secondary | ICD-10-CM | POA: Diagnosis not present

## 2023-03-18 DIAGNOSIS — J302 Other seasonal allergic rhinitis: Secondary | ICD-10-CM

## 2023-03-18 MED ORDER — AZELASTINE-FLUTICASONE 137-50 MCG/ACT NA SUSP: NASAL | 12 refills | Status: DC

## 2023-03-18 NOTE — Patient Instructions (Signed)
Dymista refilled for use if needed  Try an otc nasal saline spray and stay comfortably hydrated to help with mucus.  Try otc Pepcid as an acid blocker- maybe once daily before breakfast. See if it helps with acid reflux.

## 2023-03-19 NOTE — Assessment & Plan Note (Signed)
We discussed and refilled Dymista.  She can also continue Nasalcrom.

## 2023-03-19 NOTE — Assessment & Plan Note (Signed)
Suggested trial of Pepcid and asked her to discuss this problem with Dr. Okey Dupre

## 2023-05-02 ENCOUNTER — Telehealth: Payer: Self-pay | Admitting: Internal Medicine

## 2023-05-02 NOTE — Telephone Encounter (Signed)
Spoke with patient regarding prior message . Patient stated she went to Urgent care with [facility] . Patient had a EKG,CXR and Lab work done. Waiting on labs to make sure patient doesn't have a PE . Patient stated she will contact our office next week if she is still not feeling better.   Patient's voice was understanding. Nothing else further needed.

## 2023-05-07 ENCOUNTER — Ambulatory Visit (INDEPENDENT_AMBULATORY_CARE_PROVIDER_SITE_OTHER): Payer: Medicare Other | Admitting: Internal Medicine

## 2023-05-07 ENCOUNTER — Other Ambulatory Visit (INDEPENDENT_AMBULATORY_CARE_PROVIDER_SITE_OTHER): Payer: Medicare Other

## 2023-05-07 ENCOUNTER — Encounter: Payer: Self-pay | Admitting: Internal Medicine

## 2023-05-07 VITALS — BP 148/68 | HR 67 | Temp 97.6°F | Ht 64.5 in | Wt 127.0 lb

## 2023-05-07 DIAGNOSIS — R0609 Other forms of dyspnea: Secondary | ICD-10-CM | POA: Diagnosis not present

## 2023-05-07 DIAGNOSIS — R0781 Pleurodynia: Secondary | ICD-10-CM | POA: Insufficient documentation

## 2023-05-07 LAB — CBC WITH DIFFERENTIAL/PLATELET
Basophils Absolute: 0.1 10*3/uL (ref 0.0–0.1)
Basophils Relative: 1 % (ref 0.0–3.0)
Eosinophils Absolute: 0.2 10*3/uL (ref 0.0–0.7)
Eosinophils Relative: 2.8 % (ref 0.0–5.0)
HCT: 40.1 % (ref 36.0–46.0)
Hemoglobin: 13.3 g/dL (ref 12.0–15.0)
Lymphocytes Relative: 20 % (ref 12.0–46.0)
Lymphs Abs: 1.2 10*3/uL (ref 0.7–4.0)
MCHC: 33.3 g/dL (ref 30.0–36.0)
MCV: 94.2 fl (ref 78.0–100.0)
Monocytes Absolute: 0.5 10*3/uL (ref 0.1–1.0)
Monocytes Relative: 8.6 % (ref 3.0–12.0)
Neutro Abs: 4.1 10*3/uL (ref 1.4–7.7)
Neutrophils Relative %: 67.6 % (ref 43.0–77.0)
Platelets: 251 10*3/uL (ref 150.0–400.0)
RBC: 4.25 Mil/uL (ref 3.87–5.11)
RDW: 13.1 % (ref 11.5–15.5)
WBC: 6.1 10*3/uL (ref 4.0–10.5)

## 2023-05-07 LAB — BASIC METABOLIC PANEL
BUN: 24 mg/dL — ABNORMAL HIGH (ref 6–23)
CO2: 29 meq/L (ref 19–32)
Calcium: 10.3 mg/dL (ref 8.4–10.5)
Chloride: 103 meq/L (ref 96–112)
Creatinine, Ser: 0.87 mg/dL (ref 0.40–1.20)
GFR: 64.82 mL/min (ref >60.00)
Glucose, Bld: 83 mg/dL (ref 70–99)
Potassium: 4.4 meq/L (ref 3.5–5.1)
Sodium: 140 meq/L (ref 135–145)

## 2023-05-07 MED ORDER — ACETAMINOPHEN-CODEINE 300-30 MG PO TABS: 1.0000 | ORAL_TABLET | ORAL | 0 refills | Status: DC | PRN

## 2023-05-07 NOTE — Assessment & Plan Note (Signed)
Current complaint seems more acute. Watch for acute on  chronic issue.

## 2023-05-07 NOTE — Patient Instructions (Signed)
Order- lab- CBC w diff, BMET, D-dimer    dx pleuritic chest pain, dyspnea  Order- CTa chest PE     dx chest pain, dyspnea  You can try heating pad, Solenpas pain patch, Ibuprofen  Script sent for Tylenol # 3 for pain

## 2023-05-07 NOTE — Assessment & Plan Note (Signed)
Subscapular R back pain, pleuritic, with "tiredness" and dyspnea. Hopefully this is benign MSCWP, catching her breath. Possibly nerve root irritation. No evidence of shingles or trauma. Need to be very sure there is no PE or early pneumonia. Plan- CT, D-dimer, CBC, BMET. Analgesics

## 2023-05-07 NOTE — Progress Notes (Signed)
HPI female never smoker (widow-husband died of lung cancer) followed for allergic rhinitis, complicated by GERD IgE total 02/20/16- 7 EOS 0.2 Allergy Profile 05/12/15- negative with minor elevation only for cat dander, total IgE 12 ---------------------------------------------------------------------------------------------------   03/18/23- 77 year old female never smoker (widowed twice-1st husband died of lung cancer) followed for lung nodule, allergic rhinitis, complicated by GERD, hearing loss, Anxiety/Depression,  CT in 2018 was negative. -Nasalcrom, Singulair, Astelin-fluticasone, -----Breathing has been good Feels "tired" and implies this partly relates to death of second husband earlier this year.  Component of depression likely. Aware of active GERD  Associates this with occasional cough.  She was avoiding acid blockers because of osteoporosis.  I suggested trial of Pepcid and asked her to discuss with Dr. Okey Dupre. Continues to manage rhinitis with Dymista and Nasalcrom. CXR 03/15/22- MPRESSION: No active cardiopulmonary disease.  05/07/23- 77 year old female never smoker (widowed twice-1st husband died of lung cancer) followed for lung nodule, allergic rhinitis, complicated by GERD, hearing loss, Anxiety/Depression,  CT in 2018 was negative. -Nasalcrom, Singulair, Astelin-fluticasone, She comes today reporting that she has been "tired" for at least a month.  Starting on 8/27 she became aware of more specific malaise, mostly with discomfort in the right mid back.  She saw a nurse at  [facility] and was referred from there to the [facility] [place].  No specific diagnosis is available but she had labs including D-dimer and cardiac enzymes, EKG and CXR which were unrevealing.  She comes now because she continues short of breath with pleuritic right back pain at the inferior end of her scapula, which have not abated.  There has been no fever, bleeding, cough or adenopathy.  She  does not recognize any trauma.  She does have history of osteoporosis.  We discussed possibility of nerve root irritation or musculoskeletal chest wall pain. D-dimer 05/02/23 (190-500)- 390 CXR 05/02/23 [facility] IMPRESSION:  There is no evidence of acute cardiac or pulmonary abnormality.     ROS-see HPI  + = positive Constitutional:   No-   weight loss, night sweats, fevers, chills, fatigue, lassitude. HEENT:   No-  headaches, difficulty swallowing, tooth/dental problems, sore throat,        sneezing, itching, ear ache, nasal congestion, +post nasal drip,  CV:  No-   chest pain, orthopnea, PND, swelling in lower extremities, anasarca,  dizziness, palpitations Resp: No-   shortness of breath with exertion or at rest.              No-   productive cough,   non-productive cough,  No- coughing up of blood.              No-   change in color of mucus.  No- wheezing.   Skin: No-   rash or lesions. GI:    heartburn, indigestion,no- abdominal pain, nausea, vomiting,  GU:  MS:  No-   joint pain or swelling.  +pains R scapula Neuro-     nothing unusual Psych:  No- change in mood or affect. + depression or anxiety.  No memory loss.  OBJ- Physical Exam General- Alert, Oriented, Affect-appropriate, Distress- none acute, trim/frail/elderly Skin- rash-none, +pale  No shingles rash Lymphadenopathy- none Head- atraumatic            Eyes- Gross vision intact, PERRLA, conjunctivae and secretions clear            Ears- no problem with face-to-face conversation in quiet exam room.  Nose- clear, no-Septal dev, mucus, polyps, erosion, perforation             Throat- Mallampati IV , mucosa clear , drainage- none, tonsils- atrophic Neck- flexible , trachea midline, no stridor , thyroid nl, carotid no bruit Chest - symmetrical excursion , unlabored           Heart/CV- RRR , no murmur , no gallop  , no rub, nl s1 s2                           - JVD- none , edema- none, stasis changes- none, varices-  none           Lung- clear to P&A, wheeze- none, cough- none , dullness-none, rub- none           Chest wall-  Abd-  Br/ Gen/ Rectal- Not done, not indicated Extrem- cyanosis- none, clubbing, none, atrophy- none, strength- nl Neuro- grossly intact to observation

## 2023-05-22 ENCOUNTER — Other Ambulatory Visit: Payer: Self-pay | Admitting: Internal Medicine

## 2023-05-23 ENCOUNTER — Ambulatory Visit
Admission: RE | Admit: 2023-05-23 | Discharge: 2023-05-23 | Disposition: A | Discharge status: Home / Self Care | Payer: Medicare Other | Source: Ambulatory Visit | Attending: Internal Medicine | Admitting: Internal Medicine

## 2023-05-23 DIAGNOSIS — R0609 Other forms of dyspnea: Secondary | ICD-10-CM

## 2023-05-23 MED ORDER — IOPAMIDOL (ISOVUE-370) INJECTION 76%
500.0000 mL | Freq: Once | INTRAVENOUS | Status: AC | PRN
  Administered 2023-05-23: 75 mL via INTRAVENOUS

## 2023-06-10 ENCOUNTER — Encounter (INDEPENDENT_AMBULATORY_CARE_PROVIDER_SITE_OTHER): Payer: Self-pay | Admitting: Otolaryngology

## 2023-06-10 ENCOUNTER — Ambulatory Visit (INDEPENDENT_AMBULATORY_CARE_PROVIDER_SITE_OTHER): Payer: Medicare Other | Admitting: Otolaryngology

## 2023-06-10 VITALS — Ht 64.0 in | Wt 128.0 lb

## 2023-06-10 DIAGNOSIS — H6983 Other specified disorders of Eustachian tube, bilateral: Secondary | ICD-10-CM | POA: Diagnosis not present

## 2023-06-10 DIAGNOSIS — H903 Sensorineural hearing loss, bilateral: Secondary | ICD-10-CM

## 2023-06-10 DIAGNOSIS — J310 Chronic rhinitis: Secondary | ICD-10-CM

## 2023-06-10 DIAGNOSIS — H6981 Other specified disorders of Eustachian tube, right ear: Secondary | ICD-10-CM | POA: Insufficient documentation

## 2023-06-10 NOTE — Progress Notes (Signed)
Patient ID: Mary Malone, female   DOB: 15-Jul-1946, 77 y.o.   MRN: [id number]  Cc: Hearing loss, popping and clogging sensation in right ear  HPI: The patient is a 77 year old female who presents today complaining of right greater than left hearing loss.  She has a history of right ear eustachian tube dysfunction.  She was previously treated with right myringotomy and tube placement.  The patient returns today complaining of chronic nasal congestion.  She is using Allegra and Dymista nasal spray daily.  Her recent MRI scan showed a right mastoid effusion.  Currently she denies any otalgia, otorrhea, facial pain, or fever.  Her hearing test at [facility] showed bilateral sensorineural hearing loss, with slight right ear conductive hearing loss.  Exam: General: Communicates without difficulty, well nourished, no acute distress. Head: Normocephalic, no evidence injury, no tenderness, facial buttresses intact without stepoff. Face/sinus: No tenderness to palpation and percussion. Facial movement is normal and symmetric. Eyes: PERRL, EOMI. No scleral icterus, conjunctivae clear. Neuro: CN II exam reveals vision grossly intact.  No nystagmus at any point of gaze. Ears: Auricles well formed without lesions.  Ear canals are intact without mass or lesion.  No erythema or edema is appreciated.  Both tympanic membranes are intact, with the right tympanic membrane mildly retracted.  No middle ear effusion is noted.  Nose: External evaluation reveals normal support and skin without lesions.  Dorsum is intact.  Anterior rhinoscopy reveals congested mucosa over anterior aspect of inferior turbinates and intact septum.  No purulence noted. Oral:  Oral cavity and oropharynx are intact, symmetric, without erythema or edema.  Mucosa is moist without lesions. Neck: Full range of motion without pain.  There is no significant lymphadenopathy.  No masses palpable.  Thyroid bed within normal limits to palpation.   Parotid glands and submandibular glands equal bilaterally without mass.  Trachea is midline. Neuro:  CN 2-12 grossly intact.    Assessment: 1.  Chronic rhinitis with nasal mucosal congestion and right ear eustachian tube dysfunction. 2.  The right tympanic membrane is mildly retracted. 3.  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  Plan: 1.  The physical exam findings are reviewed with the patient. 2.  Continue with Dymista nasal spray daily. 3.  Valsalva exercise multiple times a day. 4.  The patient will return for reevaluation in 1 month.

## 2023-06-17 ENCOUNTER — Other Ambulatory Visit: Payer: Self-pay | Admitting: Internal Medicine

## 2023-06-17 DIAGNOSIS — Z1231 Encounter for screening mammogram for malignant neoplasm of breast: Secondary | ICD-10-CM

## 2023-07-12 ENCOUNTER — Encounter (INDEPENDENT_AMBULATORY_CARE_PROVIDER_SITE_OTHER): Payer: Self-pay

## 2023-07-12 ENCOUNTER — Ambulatory Visit (INDEPENDENT_AMBULATORY_CARE_PROVIDER_SITE_OTHER): Payer: Medicare Other | Admitting: Otolaryngology

## 2023-07-12 VITALS — Ht 64.0 in | Wt 130.0 lb

## 2023-07-12 DIAGNOSIS — R0981 Nasal congestion: Secondary | ICD-10-CM | POA: Diagnosis not present

## 2023-07-12 DIAGNOSIS — H903 Sensorineural hearing loss, bilateral: Secondary | ICD-10-CM | POA: Diagnosis not present

## 2023-07-12 DIAGNOSIS — J310 Chronic rhinitis: Secondary | ICD-10-CM | POA: Diagnosis not present

## 2023-07-12 DIAGNOSIS — H6983 Other specified disorders of Eustachian tube, bilateral: Secondary | ICD-10-CM

## 2023-07-13 NOTE — Progress Notes (Signed)
Patient ID: Mary Malone, female   DOB: 1946/07/02, 77 y.o.   MRN: [id number]  Follow-up: Hearing loss, right ear eustachian tube dysfunction  HPI: The patient is a 77 year old female who returns today for her follow-up evaluation.  The patient was last seen 1 month ago.  She has a history of right ear eustachian tube dysfunction.  She was previously treated with right myringotomy and tube placement.  The tube has since extruded.  At her last visit 1 month ago, she was complaining of clogging sensation in her right ear.  She was noted to have chronic rhinitis with nasal mucosal congestion.  The right tympanic membrane was mildly retracted.  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  The patient was continued on her Dymista nasal spray.  She was also encouraged to perform the Valsalva exercise multiple times a day.  The patient returns today complaining of persistent clogging sensation in her right ear.  She has not noted any significant change in her hearing.   Patient ID: Mary Malone, female   DOB: 08/10/1946, 77 y.o.   MRN: [id number]   Cc: Hearing loss, popping and clogging sensation in right ear   HPI: The patient is a 77 year old female who presents today complaining of right greater than left hearing loss.  She has a history of right ear eustachian tube dysfunction.  She was previously treated with right myringotomy and tube placement.  The patient returns today complaining of chronic nasal congestion.  She is using Allegra and Dymista nasal spray daily.  Her recent MRI scan showed a right mastoid effusion.  Currently she denies any otalgia, otorrhea, facial pain, or fever.  Her hearing test at [facility] showed bilateral sensorineural hearing loss, with slight right ear conductive hearing loss.   Exam: General: Communicates without difficulty, well nourished, no acute distress. Head: Normocephalic, no  evidence injury, no tenderness, facial buttresses intact without stepoff. Face/sinus: No tenderness to palpation and percussion. Facial movement is normal and symmetric. Eyes: PERRL, EOMI. No scleral icterus, conjunctivae clear. Neuro: CN II exam reveals vision grossly intact.  No nystagmus at any point of gaze. Ears: Auricles well formed without lesions.  Ear canals are intact without mass or lesion.  No erythema or edema is appreciated.  Both tympanic membranes are intact, with the right tympanic membrane mildly retracted.  No middle ear effusion is noted.  Nose: External evaluation reveals normal support and skin without lesions.  Dorsum is intact.  Anterior rhinoscopy reveals congested mucosa over anterior aspect of inferior turbinates and intact septum.  No purulence noted. Oral:  Oral cavity and oropharynx are intact, symmetric, without erythema or edema.  Mucosa is moist without lesions. Neck: Full range of motion without pain.  There is no significant lymphadenopathy.  No masses palpable.  Thyroid bed within normal limits to palpation.  Parotid glands and submandibular glands equal bilaterally without mass.  Trachea is midline. Neuro:  CN 2-12 grossly intact.     Assessment: 1.  Chronic rhinitis with nasal mucosal congestion and right ear eustachian tube dysfunction. 2.  The right tympanic membrane is mildly retracted. 3.  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   Plan: 1.  The physical exam findings are reviewed with the patient.  The patient is reassured that no acute infection is noted today. 2.  Continue with Dymista nasal spray and Valsalva exercise daily. 3.  Due to her persistent symptoms, the patient is interested in a second opinion.  A referral to a neuro-otologist will be arranged soon as possible.

## 2023-07-13 NOTE — Addendum Note (Signed)
Addended bySuszanne Conners, Merly Hinkson on: 07/13/2023 11:56 AM   Modules accepted: Orders

## 2023-07-18 ENCOUNTER — Ambulatory Visit: Payer: Medicare Other | Admitting: Obstetrics and Gynecology

## 2023-07-18 ENCOUNTER — Encounter (INDEPENDENT_AMBULATORY_CARE_PROVIDER_SITE_OTHER): Payer: Self-pay

## 2023-07-29 NOTE — Progress Notes (Signed)
77 y.o. G0P0 Widowed Caucasian female here for a breast and pelvic exam.    The patient is also followed for vaginal atrophy. Using Premarin vaginal cream twice weekly. It helps with dryness.   Having hearing and vision issues.  Seeing specialists.   Not sexually active.   Has a brother in the area.   PCP: Myrlene Broker, MD   Patient's last menstrual period was 09/03/1996.           Sexually active: No.  The current method of family planning is post menopausal status.    Menopausal hormone therapy:  premarin cream Exercising: No.   Smoker:  no  OB History     Gravida  0   Para      Term      Preterm      AB      Living         SAB      IAB      Ectopic      Multiple      Live Births              HEALTH MAINTENANCE: Last 2 paps:  06/14/21 - neg HR HPV ,06/08/20 neg, 05/29/18 neg History of abnormal Pap or positive HPV:  yes, 04-19-14 ASCUS:Neg HR HPV, Hx of Pos HPV in 2002?  Mammogram:  08/08/23 Breast Density Cat C, BI-RADS CAT 1 neg Colonoscopy:  06/16/14 Bone Density:  06/15/21  Result  osteopenic   Immunization History  Administered Date(s) Administered   Influenza Split 05/14/2013   Influenza Whole 05/09/2011   Influenza, High Dose Seasonal PF 06/17/2017, 06/12/2018, 05/08/2019, 05/08/2019, 06/05/2020   Influenza,inj,Quad PF,6+ Mos 05/04/2014   Influenza-Unspecified 05/15/2012, 05/19/2012, 06/04/2015, 06/14/2016, 06/12/2018, 05/08/2019, 05/16/2021, 05/25/2022   Moderna SARS-COV2 Booster Vaccination 06/09/2022   PFIZER Comirnaty(Gray Top)Covid-19 Tri-Sucrose Vaccine 12/21/2020   PFIZER(Purple Top)SARS-COV-2 Vaccination 10/09/2019, 10/30/2019, 05/03/2020   Pneumococcal Conjugate-13 02/10/2015   Pneumococcal Polysaccharide-23 09/22/2008, 02/19/2017   RSV,unspecified 07/20/2022   Td 01/28/2013   Tdap 01/31/2023   Zoster Recombinant(Shingrix) 05/31/2021, 11/23/2021      reports that she has never smoked. She has never used  smokeless tobacco. She reports that she does not drink alcohol and does not use drugs.  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   Osteoporosis    PMB (postmenopausal bleeding) 12/2001   polyps   Serum calcium elevated 2021   sees Dr.Ellison/pulmonology with [facility]   Thyroid disease    hypothyroidism   URINARY INCONTINENCE    OAB   Wisdom teeth removed 1866/1967    Past Surgical History:  Procedure Laterality Date   BREAST CYST ASPIRATION  12/05/2001   CATARACT EXTRACTION Bilateral 07/2012, 09/2012   DILATION AND CURETTAGE OF UTERUS  2003   HYSTEROSCOPY  01/2002   resection, sm fibroid   Left knee surgey     MENISCUS REPAIR     left knee 2010   REFRACTIVE SURGERY  02/2016   WISDOM TOOTH EXTRACTION      Current Outpatient Medications  Medication Sig Dispense Refill  Azelastine-Fluticasone 137-50 MCG/ACT SUSP SHAKE LIQUID AND USE 1 TO 2 SPRAYS IN EACH NOSTRIL TWICE DAILY AS NEEDED 23 g 12   Calcium Citrate-Vitamin D (CITRACAL + D PO) Take 1,200 mg by mouth daily.     Cholecalciferol (VITAMIN D3) 2000 units TABS Take 1 tablet by mouth daily.     levothyroxine (SYNTHROID) 25 MCG tablet TAKE 1 TABLET(25 MCG) BY MOUTH DAILY BEFORE BREAKFAST 90 tablet 3   Magnesium 250 MG TABS Take 250 mg by mouth daily.     Multiple Vitamins-Minerals (CENTRUM SILVER) tablet Take 1 tablet by mouth daily.     PREVIDENT 5000 BOOSTER PLUS 1.1 % PSTE 3 (three) times daily as needed.     sertraline (ZOLOFT) 50 MG tablet TAKE 1 TABLET BY MOUTH DAILY 90 tablet 1   Zoledronic Acid (RECLAST IV) Inject into the vein. IV infusion yearly     conjugated estrogens (PREMARIN) vaginal cream USE 0.5 GRAM  VAGINALLY 2 TIMES A WEEK 30 g 1   No current facility-administered medications for this visit.    ALLERGIES: Patient has no known allergies.  Family History  Problem Relation Age of Onset   Alzheimer's disease Mother    Allergies Mother    Breast cancer Mother 59   Dementia Mother    Lung cancer Father 14   Allergies Brother    Diabetes Brother    Transient ischemic attack Maternal Grandmother    Peripheral Artery Disease Maternal Grandmother        s/p B leg amputation   Stroke Maternal Grandmother    Tuberculosis Maternal Grandfather    Diabetes Brother 33   Alzheimer's disease Brother 27       early onset   Sudden death Paternal Grandmother 30       unknown   Breast cancer Paternal Aunt    Osteoporosis Neg Hx     Review of Systems  All other systems reviewed and are negative.   PHYSICAL EXAM:  BP 136/70 (BP Location: Left Arm, Patient Position: Sitting, Cuff Size: Normal)   Pulse 68   Ht 5' 4.75" (1.645 m)   Wt 130 lb (59 kg)   LMP 09/03/1996   SpO2 98%   BMI 21.80 kg/m     General appearance: alert, cooperative and appears stated age Head: normocephalic, without obvious abnormality, atraumatic Neck: no adenopathy, supple, symmetrical, trachea midline and thyroid normal to inspection and palpation Lungs: clear to auscultation bilaterally Breasts: normal appearance, no masses or tenderness, No nipple retraction or dimpling, No nipple discharge or bleeding, No axillary adenopathy Heart: regular rate and rhythm Abdomen: soft, non-tender; no masses, no organomegaly Extremities: extremities normal, atraumatic, no cyanosis or edema Skin: skin color, texture, turgor normal. No rashes or lesions Lymph nodes: cervical, supraclavicular, and axillary nodes normal. Neurologic: grossly normal  Pelvic: External genitalia:  no lesions              No abnormal inguinal nodes palpated.              Urethra:  normal appearing urethra with no masses, tenderness or lesions               Bartholins and Skenes: normal                 Vagina: normal appearing vagina with normal color and discharge, no lesions              Cervix: no lesions  Pap taken: Yes.   Bimanual Exam:  Uterus:  normal size, contour, position, consistency, mobility, non-tender              Adnexa: no mass, fullness, tenderness              Rectal exam: Yes.  .  Confirms.              Anus:  normal sphincter tone, no lesions  Chaperone was present for exam:  Warren Lacy, CMA  ASSESSMENT: Encounter for breast and pelvic exam.  Mixed incontinence.  Off meds. Hx incomplete uterovaginal prolapse. Hx osteoporosis.  Now osteopenia.  Status post Reclast.  Hypercalcemia. Compression fracture of thoracic/lumbar region. Vulvovaginal atrophy.  FH of breast cancer in mother. Husband, now deceased, had HPV head and neck cancer.  Patient is negative for HR HPV.   PLAN: Mammogram screening discussed. Self breast awareness reviewed. Pap and reflex HRV collected:  yes Guidelines for Calcium, Vitamin D, regular exercise program including cardiovascular and weight bearing exercise. Medication refills:  vaginal Premarin cream.  I discussed potential effect on breast cancer.  Her endocrinologist is managing her bone health. Follow up:  1 year    20 min  total time was spent for this patient encounter, including preparation, face-to-face counseling with the patient, coordination of care, and documentation of the encounter in addition to doing the breast and pelvic exam and pap.

## 2023-08-05 ENCOUNTER — Ambulatory Visit: Payer: Medicare Other | Admitting: Obstetrics and Gynecology

## 2023-08-06 ENCOUNTER — Ambulatory Visit
Admission: RE | Admit: 2023-08-06 | Discharge: 2023-08-06 | Disposition: A | Discharge status: Home / Self Care | Payer: Medicare Other | Source: Ambulatory Visit | Attending: Internal Medicine | Admitting: Internal Medicine

## 2023-08-06 DIAGNOSIS — Z1231 Encounter for screening mammogram for malignant neoplasm of breast: Secondary | ICD-10-CM

## 2023-08-08 ENCOUNTER — Encounter: Payer: Self-pay | Admitting: Internal Medicine

## 2023-08-08 LAB — HM MAMMOGRAPHY

## 2023-08-12 ENCOUNTER — Ambulatory Visit (INDEPENDENT_AMBULATORY_CARE_PROVIDER_SITE_OTHER): Payer: Medicare Other | Admitting: Obstetrics and Gynecology

## 2023-08-12 ENCOUNTER — Encounter: Payer: Self-pay | Admitting: Obstetrics and Gynecology

## 2023-08-12 ENCOUNTER — Other Ambulatory Visit (HOSPITAL_COMMUNITY)
Admission: RE | Admit: 2023-08-12 | Discharge: 2023-08-12 | Disposition: A | Discharge status: Home / Self Care | Payer: Medicare Other | Source: Ambulatory Visit | Attending: Obstetrics and Gynecology | Admitting: Obstetrics and Gynecology

## 2023-08-12 VITALS — BP 136/70 | HR 68 | Ht 64.75 in | Wt 130.0 lb

## 2023-08-12 DIAGNOSIS — N952 Postmenopausal atrophic vaginitis: Secondary | ICD-10-CM | POA: Diagnosis not present

## 2023-08-12 DIAGNOSIS — Z5181 Encounter for therapeutic drug level monitoring: Secondary | ICD-10-CM | POA: Diagnosis not present

## 2023-08-12 DIAGNOSIS — Z124 Encounter for screening for malignant neoplasm of cervix: Secondary | ICD-10-CM | POA: Insufficient documentation

## 2023-08-12 DIAGNOSIS — Z01419 Encounter for gynecological examination (general) (routine) without abnormal findings: Secondary | ICD-10-CM

## 2023-08-12 MED ORDER — PREMARIN 0.625 MG/GM VA CREA: TOPICAL_CREAM | VAGINAL | 1 refills | Status: DC

## 2023-08-12 NOTE — Patient Instructions (Signed)

## 2023-08-13 LAB — CYTOLOGY - PAP
Adequacy: ABSENT
Diagnosis: NEGATIVE

## 2023-08-19 ENCOUNTER — Telehealth: Payer: Self-pay | Admitting: Internal Medicine

## 2023-08-21 MED ORDER — AZELASTINE-FLUTICASONE 137-50 MCG/ACT NA SUSP: NASAL | 12 refills | Status: DC

## 2023-08-21 NOTE — Telephone Encounter (Signed)
Dymista (azelastine-fluticasone) refilled

## 2023-10-09 ENCOUNTER — Encounter: Payer: Self-pay | Admitting: Internal Medicine

## 2023-10-11 MED ORDER — DYMISTA 137-50 MCG/ACT NA SUSP
1.0000 | Freq: Two times a day (BID) | NASAL | 5 refills | Status: DC | PRN
  Filled 2024-06-30: qty 23, 30d supply, fill #0

## 2023-10-11 NOTE — Telephone Encounter (Signed)
 Dymista  is the brand name for this combination. Ok to send Rx. Thanks

## 2023-11-20 ENCOUNTER — Other Ambulatory Visit: Payer: Self-pay | Admitting: Internal Medicine

## 2023-11-21 ENCOUNTER — Encounter: Payer: Self-pay | Admitting: Internal Medicine

## 2023-12-10 ENCOUNTER — Encounter: Payer: Self-pay | Admitting: Internal Medicine

## 2023-12-10 ENCOUNTER — Ambulatory Visit: Admitting: Internal Medicine

## 2023-12-10 VITALS — BP 134/82 | HR 71 | Temp 97.8°F | Ht 64.75 in | Wt 134.0 lb

## 2023-12-10 DIAGNOSIS — F419 Anxiety disorder, unspecified: Secondary | ICD-10-CM | POA: Diagnosis not present

## 2023-12-10 DIAGNOSIS — Z0000 Encounter for general adult medical examination without abnormal findings: Secondary | ICD-10-CM

## 2023-12-10 DIAGNOSIS — F32A Depression, unspecified: Secondary | ICD-10-CM

## 2023-12-10 DIAGNOSIS — H532 Diplopia: Secondary | ICD-10-CM

## 2023-12-10 DIAGNOSIS — E041 Nontoxic single thyroid nodule: Secondary | ICD-10-CM

## 2023-12-10 NOTE — Progress Notes (Unsigned)
   Subjective:   Patient ID: Mary Malone, female    DOB: 1945-10-15, 78 y.o.   MRN: [id number]  HPI The patient is here for physical. New double vision and being worked up for myasthenia gravis going on since last summer at least.   PMH, [facility], social history reviewed and updated  Review of Systems  Objective:  Physical Exam  Vitals:   12/10/23 1437  BP: 134/82  Pulse: 71  Temp: 97.8 F (36.6 C)  TempSrc: Oral  SpO2: 96%  Weight: 134 lb (60.8 kg)  Height: 5' 4.75" (1.645 m)    Assessment & Plan:

## 2023-12-10 NOTE — Patient Instructions (Signed)
 Let us know if you need a refill of the zoloft.

## 2023-12-13 ENCOUNTER — Encounter: Payer: Self-pay | Admitting: Internal Medicine

## 2023-12-13 DIAGNOSIS — H532 Diplopia: Secondary | ICD-10-CM | POA: Insufficient documentation

## 2023-12-13 NOTE — Assessment & Plan Note (Signed)
 Getting workup with neuroopth at this time.

## 2023-12-13 NOTE — Assessment & Plan Note (Signed)
Flu shot yearly. Pneumonia complete. Shingrix complete. Tetanus up to date. Colonoscopy up to date. Mammogram up to date, pap smear aged out and dexa complete. Counseled about sun safety and mole surveillance. Counseled about the dangers of distracted driving. Given 10 year screening recommendations.   

## 2023-12-13 NOTE — Assessment & Plan Note (Signed)
 Taking synthroid 25 mcg daily and levels checked with endo and stable.

## 2023-12-13 NOTE — Assessment & Plan Note (Signed)
 Stopped zoloft due to running out and would like to stop for now. She will let us know if she wants to resume.

## 2023-12-27 ENCOUNTER — Other Ambulatory Visit (HOSPITAL_BASED_OUTPATIENT_CLINIC_OR_DEPARTMENT_OTHER): Payer: Self-pay | Admitting: Internal Medicine

## 2023-12-27 DIAGNOSIS — M810 Age-related osteoporosis without current pathological fracture: Secondary | ICD-10-CM

## 2023-12-31 ENCOUNTER — Ambulatory Visit (HOSPITAL_BASED_OUTPATIENT_CLINIC_OR_DEPARTMENT_OTHER)
Admission: RE | Admit: 2023-12-31 | Discharge: 2023-12-31 | Disposition: A | Discharge status: Home / Self Care | Source: Ambulatory Visit | Attending: Internal Medicine | Admitting: Internal Medicine

## 2023-12-31 DIAGNOSIS — M810 Age-related osteoporosis without current pathological fracture: Secondary | ICD-10-CM | POA: Insufficient documentation

## 2024-02-17 ENCOUNTER — Ambulatory Visit (INDEPENDENT_AMBULATORY_CARE_PROVIDER_SITE_OTHER): Payer: Medicare Other

## 2024-02-17 VITALS — Ht 64.0 in | Wt 134.0 lb

## 2024-02-17 DIAGNOSIS — Z0000 Encounter for general adult medical examination without abnormal findings: Secondary | ICD-10-CM | POA: Diagnosis not present

## 2024-02-17 NOTE — Progress Notes (Signed)
 Subjective:  Please attest and cosign this visit due to patients primary care provider not being in the office at the time the visit was completed.  (Pt of Dr Bambi Lever)   Mary Malone is a 78 y.o. who presents for a Medicare Wellness preventive visit.  As a reminder, Annual Wellness Visits don't include a physical exam, and some assessments may be limited, especially if this visit is performed virtually. We may recommend an in-person follow-up visit with your provider if needed.  Visit Complete: Virtual I connected with  Mary Malone on 02/17/24 by a audio enabled telemedicine application and verified that I am speaking with the correct person using two identifiers.  Patient Location: Home  Provider Location: Office/Clinic  I discussed the limitations of evaluation and management by telemedicine. The patient expressed understanding and agreed to proceed.  Vital Signs: Because this visit was a virtual/telehealth visit, some criteria may be missing or patient reported. Any vitals not documented were not able to be obtained and vitals that have been documented are patient reported.  VideoDeclined- This patient declined Librarian, academic. Therefore the visit was completed with audio only.  Persons Participating in Visit: Patient.  AWV Questionnaire: No: Patient Medicare AWV questionnaire was not completed prior to this visit.  Cardiac Risk Factors include: advanced age (>45men, >66 women)     Objective:    Today's Vitals   02/17/24 1512  Weight: 134 lb (60.8 kg)  Height: 5' 4 (1.626 m)   Body mass index is 23 kg/m.     02/17/2024    3:11 PM 02/12/2023   10:17 AM 02/12/2022    3:06 PM 02/01/2021   11:53 AM 11/09/2020    9:47 AM 06/18/2018    9:16 AM 02/17/2015    2:09 PM  Advanced Directives  Does Patient Have a Medical Advance Directive? Yes Yes Yes No Yes Yes  Yes   Type of Estate agent of [place];Living will  Healthcare Power of [place];Living will Living will;Healthcare Power of [employer] Power of [place];Living will Healthcare Power of [place];Living will Healthcare Power of [place];Living will   Does patient want to make changes to medical advance directive?   No - Patient declined  No - Patient declined    Copy of Healthcare Power of Attorney in Chart? No - copy requested No - copy requested No - copy requested  No - copy requested No - copy requested    Would patient like information on creating a medical advance directive?    No - Patient declined        Data saved with a previous flowsheet row definition    Current Medications (verified) Outpatient Encounter Medications as of 02/17/2024  Medication Sig   Calcium Citrate-Vitamin D  (CITRACAL + D PO) Take 1,200 mg by mouth daily.   conjugated estrogens  (PREMARIN ) vaginal cream USE 0.5 GRAM VAGINALLY 2 TIMES A WEEK   DYMISTA  137-50 MCG/ACT SUSP Place 1 spray into the nose 2 (two) times daily as needed.   levothyroxine  (SYNTHROID ) 25 MCG tablet TAKE 1 TABLET(25 MCG) BY MOUTH DAILY BEFORE BREAKFAST   Multiple Vitamins-Minerals (CENTRUM SILVER) tablet Take 1 tablet by mouth daily.   PREVIDENT 5000 BOOSTER PLUS 1.1 % PSTE 3 (three) times daily as needed.   Zoledronic  Acid (RECLAST  IV) Inject into the vein. IV infusion yearly   Cholecalciferol (VITAMIN D3) 2000 units TABS Take 1 tablet by mouth daily.   Magnesium 250 MG TABS Take 250 mg  by mouth daily. (Patient not taking: Reported on 12/10/2023)   sertraline  (ZOLOFT ) 50 MG tablet TAKE 1 TABLET BY MOUTH DAILY (Patient not taking: Reported on 02/17/2024)   No facility-administered encounter medications on file as of 02/17/2024.    Allergies (verified) Patient has no known allergies.   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   Osteoporosis    PMB (postmenopausal bleeding) 12/2001   polyps   Serum calcium elevated 2021   sees Dr.Ellison/pulmonology with [place]   Thyroid  disease    hypothyroidism   URINARY INCONTINENCE    OAB   Wisdom teeth removed 1866/1967   Past Surgical History:  Procedure Laterality Date   BREAST CYST ASPIRATION  12/05/2001   CATARACT EXTRACTION Bilateral 07/2012, 09/2012   DILATION AND CURETTAGE OF UTERUS  2003   EYE SURGERY  2014   Cataracts   HYSTEROSCOPY  01/2002   resection, sm fibroid   Left knee surgey     MENISCUS REPAIR     left knee 2010   REFRACTIVE SURGERY  02/2016   WISDOM TOOTH EXTRACTION     Family History  Problem Relation Age of Onset   Alzheimer's disease Mother    Allergies Mother    Breast cancer Mother 85   Dementia Mother    Lung cancer Father 34   Allergies Brother    Diabetes Brother    Transient ischemic attack Maternal Grandmother    Peripheral Artery Disease Maternal Grandmother        s/p B leg amputation   Stroke Maternal Grandmother    Tuberculosis Maternal Grandfather    Diabetes Brother 16   Alzheimer's disease Brother 72       early onset   Sudden death Paternal Grandmother 30       unknown   Breast cancer Paternal Aunt    Osteoporosis Neg Hx    Social History   Socioeconomic History   Marital status: Widowed    Spouse name: Not on file   Number of children: Not on file   Years of education: Not on file   Highest education level: Associate degree: occupational, Scientist, product/process development, or vocational program  Occupational History   Not on file  Tobacco Use   Smoking status: Never   Smokeless tobacco: Never  Vaping Use   Vaping status: Never Used  Substance and Sexual Activity   Alcohol use: No     Alcohol/week: 1.0 standard drink of alcohol    Types: 1 Standard drinks or equivalent per week   Drug use: No   Sexual activity: Not Currently    Partners: Male    Birth control/protection: Post-menopausal    Comment: less than 5, IC after 16, no std, no abnormal pap, no des  Other Topics Concern   Not on file  Social History Narrative   Not on file   Social Drivers of Health   Financial Resource Strain: Low Risk  (02/17/2024)   Overall Physicist, medical Strain (  CARDIA)    Difficulty of Paying Living Expenses: Not hard at all  Food Insecurity: No Food Insecurity (02/17/2024)   Hunger Vital Sign    Worried About Running Out of Food in the Last Year: Never true    Ran Out of Food in the Last Year: Never true  Transportation Needs: No Transportation Needs (02/17/2024)   PRAPARE - Administrator, Civil Service (Medical): No    Lack of Transportation (Non-Medical): No  Physical Activity: Sufficiently Active (02/17/2024)   Exercise Vital Sign    Days of Exercise per Week: 3 days    Minutes of Exercise per Session: 60 min  Recent Concern: Physical Activity - Insufficiently Active (12/06/2023)   Exercise Vital Sign    Days of Exercise per Week: 1 day    Minutes of Exercise per Session: 10 min  Stress: No Stress Concern Present (02/17/2024)   [employer] of Occupational Health - Occupational Stress Questionnaire    Feeling of Stress: Not at all  Social Connections: Moderately Isolated (02/17/2024)   Social Connection and Isolation Panel    Frequency of Communication with Friends and Family: Twice a week    Frequency of Social Gatherings with Friends and Family: Three times a week    Attends Religious Services: 1 to 4 times per year    Active Member of Clubs or Organizations: No    Attends Banker Meetings: Never    Marital Status: Widowed    Tobacco Counseling Counseling given: No    Clinical Intake:  Pre-visit preparation completed: Yes  Pain :  No/denies pain     BMI - recorded: 23 Nutritional Status: BMI of 19-24  Normal Nutritional Risks: None Diabetes: No  Lab Results  Component Value Date   HGBA1C 5.6 07/12/2015     How often do you need to have someone help you when you read instructions, pamphlets, or other written materials from your doctor or pharmacy?: 1 - Never  Interpreter Needed?: No  Information entered by :: Kandy Orris, CMA   Activities of Daily Living     02/17/2024    3:16 PM  In your present state of health, do you have any difficulty performing the following activities:  Hearing? 0  Vision? 0  Difficulty concentrating or making decisions? 0  Walking or climbing stairs? 0  Dressing or bathing? 0  Doing errands, shopping? 0  Preparing Food and eating ? N  Using the Toilet? N  In the past six months, have you accidently leaked urine? Y  Comment wears a pad/depend  Do you have problems with loss of bowel control? N  Managing your Medications? N  Managing your Finances? N  Housekeeping or managing your Housekeeping? N    Patient Care Team: Adelia Homestead, MD as PCP - General (Internal Medicine) Rozelle Corning, Maricela Shoe, MD (Orthopedic Surgery) Faustina Hood, MD (Pulmonary Disease) Tami Falcon, MD (Gastroenterology) Albert Huff, MD (Ophthalmology) Jorie Newness, Blondie Burke, MD (Obstetrics and Gynecology) Lisabeth Rider, MD as Consulting Physician (Neurology) Gwyndolyn Lerner, MD (Inactive) as Consulting Physician (Endocrinology) Colie Dawes, MD as Consulting Physician (Radiation Oncology) Malmfelt, Nancyann Aye, RN as Oncology Nurse Navigator  I have updated your Care Teams any recent Medical Services you may have received from other providers in the past year.     Assessment:   This is a routine wellness examination for Mary Malone.  Hearing/Vision screen Hearing Screening - Comments:: Wears hearing aids Vision Screening - Comments:: Wears rx glasses -  up to date with routine  eye exams with Dr Roslynn Coombes   Goals Addressed               This Visit's Progress     Patient Stated (pt-stated)        Patient stated she plans to continue and complete physical therapy        Depression Screen     02/17/2024    3:19 PM 12/10/2023    2:42 PM 02/12/2023   10:12 AM 09/14/2022   10:36 AM 02/12/2022    3:10 PM 12/01/2021    2:29 PM 06/18/2018    9:17 AM  PHQ 2/9 Scores  PHQ - 2 Score 0 0 2 3 2  0 2  PHQ- 9 Score 3  3 11   4     Fall Risk     02/17/2024    3:17 PM 12/10/2023    2:42 PM 02/12/2023   10:17 AM 02/06/2023    9:41 AM 09/14/2022   10:36 AM  Fall Risk   Falls in the past year? 1 1 0 0 0  Number falls in past yr: 0 0 0  0  Comment 2      Injury with Fall? 0 1 0  0  Comment undergoing PT for balance      Risk for fall due to : Impaired balance/gait  No Fall Risks  No Fall Risks  Follow up Falls prevention discussed;Falls evaluation completed Falls evaluation completed Falls prevention discussed  Falls evaluation completed      Data saved with a previous flowsheet row definition    MEDICARE RISK AT HOME:  Medicare Risk at Home Any stairs in or around the home?: No If so, are there any without handrails?: No Home free of loose throw rugs in walkways, pet beds, electrical cords, etc?: Yes Adequate lighting in your home to reduce risk of falls?: Yes Life alert?: No Use of a cane, walker or w/c?: Yes (cane) Grab bars in the bathroom?: Yes Shower chair or bench in shower?: Yes Elevated toilet seat or a handicapped toilet?: Yes  TIMED UP AND GO:  Was the test performed?  No  Cognitive Function: 6CIT completed    02/12/2022    3:21 PM 06/18/2018    9:45 AM  MMSE - Mini Mental State Exam  Not completed: Unable to complete Refused        02/17/2024    3:22 PM 02/12/2023   10:17 AM 02/12/2022    3:22 PM  6CIT Screen  What Year? 0 points 0 points 0 points  What month? 0 points 0 points 0 points  What time? 0 points 0 points 0 points  Count back  from 20 0 points 0 points 0 points  Months in reverse 0 points 0 points 0 points  Repeat phrase 0 points 0 points 0 points  Total Score 0 points 0 points 0 points    Immunizations Immunization History  Administered Date(s) Administered   Influenza Split 05/14/2013   Influenza Whole 05/09/2011   Influenza, High Dose Seasonal PF 06/17/2017, 06/12/2018, 05/08/2019, 05/08/2019, 06/05/2020   Influenza,inj,Quad PF,6+ Mos 05/04/2014   Influenza-Unspecified 05/15/2012, 05/19/2012, 06/04/2015, 06/14/2016, 06/12/2018, 05/08/2019, 05/16/2021, 05/25/2022   Moderna SARS-COV2 Booster Vaccination 06/09/2022   PFIZER Comirnaty(Gray Top)Covid-19 Tri-Sucrose Vaccine 12/21/2020   PFIZER(Purple Top)SARS-COV-2 Vaccination 10/09/2019, 10/30/2019, 05/03/2020   Pneumococcal Conjugate-13 02/10/2015   Pneumococcal Polysaccharide-23 09/22/2008, 02/19/2017   RSV,unspecified 07/20/2022   Td 01/28/2013   Tdap 01/31/2023   Zoster Recombinant(Shingrix) 05/31/2021, 11/23/2021  Screening Tests Health Maintenance  Topic Date Due   COVID-19 Vaccine (5 - 2024-25 season) 05/05/2023   INFLUENZA VACCINE  04/03/2024   Medicare Annual Wellness (AWV)  02/16/2025   DTaP/Tdap/Td (3 - Td or Tdap) 01/30/2033   Pneumococcal Vaccine: 50+ Years  Completed   DEXA SCAN  Completed   Hepatitis C Screening  Completed   Zoster Vaccines- Shingrix  Completed   HPV VACCINES  Aged Out   Meningococcal B Vaccine  Aged Out   Colonoscopy  Discontinued    Health Maintenance  Health Maintenance Due  Topic Date Due   COVID-19 Vaccine (5 - 2024-25 season) 05/05/2023   Health Maintenance Items Addressed:02/17/2024   Additional Screening:  Vision Screening: Recommended annual ophthalmology exams for early detection of glaucoma and other disorders of the eye. Would you like a referral to an eye doctor? No    Dental Screening: Recommended annual dental exams for proper oral hygiene  Community Resource Referral / Chronic Care  Management: CRR required this visit?  No   CCM required this visit?  No   Plan:    I have personally reviewed and noted the following in the patient's chart:   Medical and social history Use of alcohol, tobacco or illicit drugs  Current medications and supplements including opioid prescriptions. Patient is not currently taking opioid prescriptions. Functional ability and status Nutritional status Physical activity Advanced directives List of other physicians Hospitalizations, surgeries, and ER visits in previous 12 months Vitals Screenings to include cognitive, depression, and falls Referrals and appointments  In addition, I have reviewed and discussed with patient certain preventive protocols, quality metrics, and best practice recommendations. A written personalized care plan for preventive services as well as general preventive health recommendations were provided to patient.   Mary Malone, CMA   02/17/2024   After Visit Summary: (MyChart) Due to this being a telephonic visit, the after visit summary with patients personalized plan was offered to patient via MyChart   Notes: Nothing significant to report at this time.

## 2024-02-17 NOTE — Patient Instructions (Signed)
 Mary Malone , Thank you for taking time out of your busy schedule to complete your Annual Wellness Visit with me. I enjoyed our conversation and look forward to speaking with you again next year. I, as well as your care team,  appreciate your ongoing commitment to your health goals. Please review the following plan we discussed and let me know if I can assist you in the future. Your Game plan/ To Do List    Follow up Visits: Next Medicare AWV with our clinical staff: 02/17/2025   Have you seen your provider in the last 6 months (3 months if uncontrolled diabetes)? Yes Next Office Visit with your provider: 12/14/2024  Clinician Recommendations:  Aim for 30 minutes of exercise or brisk walking, 6-8 glasses of water, and 5 servings of fruits and vegetables each day.       This is a list of the screening recommended for you and due dates:  Health Maintenance  Topic Date Due   COVID-19 Vaccine (5 - 2024-25 season) 05/05/2023   Flu Shot  04/03/2024   Medicare Annual Wellness Visit  02/16/2025   DTaP/Tdap/Td vaccine (3 - Td or Tdap) 01/30/2033   Pneumococcal Vaccine for age over 75  Completed   DEXA scan (bone density measurement)  Completed   Hepatitis C Screening  Completed   Zoster (Shingles) Vaccine  Completed   HPV Vaccine  Aged Out   Meningitis B Vaccine  Aged Out   Colon Cancer Screening  Discontinued    Advanced directives: (Copy Requested) Please bring a copy of your health care power of attorney and living will to the office to be added to your chart at your convenience. You can mail to [facility] [address] [place], [place] [address] or email to [email or web address] Advance Care Planning is important because it:  [x]  Makes sure you receive the medical care that is consistent with your values, goals, and preferences  [x]  It provides guidance to your family and loved ones and reduces their decisional burden about whether or not they are making the right  decisions based on your wishes.  Follow the link provided in your after visit summary or read over the paperwork we have mailed to you to help you started getting your Advance Directives in place. If you need assistance in completing these, please reach out to us  so that we can help you!

## 2024-06-15 ENCOUNTER — Ambulatory Visit (INDEPENDENT_AMBULATORY_CARE_PROVIDER_SITE_OTHER): Admitting: Otolaryngology

## 2024-06-15 ENCOUNTER — Encounter (INDEPENDENT_AMBULATORY_CARE_PROVIDER_SITE_OTHER): Payer: Self-pay | Admitting: Otolaryngology

## 2024-06-15 VITALS — BP 148/75 | HR 72 | Temp 97.5°F | Ht 64.5 in | Wt 135.0 lb

## 2024-06-15 DIAGNOSIS — H903 Sensorineural hearing loss, bilateral: Secondary | ICD-10-CM

## 2024-06-15 DIAGNOSIS — H9190 Unspecified hearing loss, unspecified ear: Secondary | ICD-10-CM

## 2024-06-15 DIAGNOSIS — H6121 Impacted cerumen, right ear: Secondary | ICD-10-CM | POA: Insufficient documentation

## 2024-06-15 DIAGNOSIS — H6981 Other specified disorders of Eustachian tube, right ear: Secondary | ICD-10-CM | POA: Diagnosis not present

## 2024-06-15 DIAGNOSIS — R42 Dizziness and giddiness: Secondary | ICD-10-CM | POA: Insufficient documentation

## 2024-06-15 DIAGNOSIS — J310 Chronic rhinitis: Secondary | ICD-10-CM

## 2024-06-15 DIAGNOSIS — H6521 Chronic serous otitis media, right ear: Secondary | ICD-10-CM | POA: Insufficient documentation

## 2024-06-15 DIAGNOSIS — R0981 Nasal congestion: Secondary | ICD-10-CM

## 2024-06-15 MED ORDER — DIAZEPAM 5 MG PO TABS: 5.0000 mg | ORAL_TABLET | Freq: Once | ORAL | 0 refills | Status: AC

## 2024-06-15 NOTE — Progress Notes (Signed)
 Patient ID: Mary Malone, female   DOB: 03-29-1946, 78 y.o.   MRN: [id number]  Follow-up: Hearing loss, right ear eustachian tube dysfunction  New complaints: Spinning vertigo, balance difficulty  Discussed the use of AI scribe software for clinical note transcription with the patient, who gave verbal consent to proceed.  History of Present Illness Mary Malone is a 78 year old female with bilateral hearing loss and right ear eustachian tube dysfunction who presents for follow-up evaluation.  She has a history of right ear eustachian tube dysfunction, previously treated with a right myringotomy and tube placement, which has since extruded. She experiences persistent pressure in her right ear, described as 'maddening'. She continues to use Dymista  nasal spray and perform Valsalva exercises.  She experienced a recent episode of vertigo on the first of the month, lasting four to five hours, with spinning sensations after looking up from her phone. She had to sit still for hours and hold onto the wall when moving. No further vertigo episodes have occurred since. She has a history of ataxia, diagnosed with cerebellar atrophy type 27B, and has experienced falls in January and April, for which she underwent physical therapy. She uses a cane for balance and has double vision.  She wears hearing aids in both ears but notes that her right ear is not hearing well despite adjustments. She has a history of chronic rhinitis with nasal mucosa congestion, for which she uses Dymista  nasal spray.  She lives independently and is still driving around her town.  Exam: General: Communicates without difficulty, well nourished, no acute distress. Head: Normocephalic, no evidence injury, no tenderness, facial buttresses intact without stepoff. Face/sinus: No tenderness to palpation and percussion. Facial movement is normal and symmetric. Eyes: PERRL, EOMI. No scleral icterus, conjunctivae clear. Neuro: CN II exam  reveals vision grossly intact.  No nystagmus at any point of gaze. Ears: Auricles well formed without lesions.  Right ear cerumen impaction.  The left tympanic membrane and middle ear space are normal.  Nose: External evaluation reveals normal support and skin without lesions.  Dorsum is intact.  Anterior rhinoscopy reveals congested mucosa over anterior aspect of inferior turbinates and intact septum.  No purulence noted. Oral:  Oral cavity and oropharynx are intact, symmetric, without erythema or edema.  Mucosa is moist without lesions. Neck: Full range of motion without pain.  There is no significant lymphadenopathy.  No masses palpable.  Thyroid  bed within normal limits to palpation.  Parotid glands and submandibular glands equal bilaterally without mass.  Trachea is midline. Neuro:  CN 2-12 grossly intact. Vestibular: No nystagmus at any point of gaze.Vestibular: There is no nystagmus with pneumatic pressure on either tympanic membrane or Valsalva. The cerebellar examination is unremarkable.   Procedure: Right ear cerumen disimpaction Anesthesia: None Description: Under the operating microscope, the cerumen is carefully removed with a combination of cerumen currette, alligator forceps, and suction catheters.  After the cerumen is removed, the right tympanic membrane is noted to be intact, with middle ear effusion.  The patient tolerated the procedure well.    Assessment and Plan Assessment & Plan Right ear eustachian tube dysfunction with effusion and hearing loss Persistent right ear eustachian tube dysfunction with effusion and associated hearing loss. The tympanic membrane is mildly retracted, and fluid accumulation in the right ear contributes to hearing impairment.  - Schedule tympanostomy tube placement in the office with local anesthesia. - Prescribe Valium for pre-procedure anxiety management. - Send prescription for Valium to [facility] on [address].  Right ear cerumen  impaction. - Otomicroscopy with right ear cerumen disimpaction.  Vertigo (episodic) Recent episode of vertigo lasting several hours, characterized by a spinning sensation. This is the first episode in a long time, possibly related to cerebellar atrophy and ataxia. No further episodes reported since the initial one. Vertigo may be exacerbated by the right ear effusion. - Refer to a physical therapist with specialization in balance and dizziness for evaluation and management.  Chronic rhinitis with nasal mucosal congestion Chronic rhinitis with nasal mucosal congestion is being managed with Dymista  nasal spray and Valsalva exercises. - Continue Dymista  nasal spray daily. - Continue Valsalva exercises.

## 2024-06-16 ENCOUNTER — Ambulatory Visit: Attending: Otolaryngology | Admitting: Rehabilitative and Restorative Service Providers"

## 2024-06-16 ENCOUNTER — Encounter: Payer: Self-pay | Admitting: Rehabilitative and Restorative Service Providers"

## 2024-06-16 ENCOUNTER — Other Ambulatory Visit: Payer: Self-pay

## 2024-06-16 DIAGNOSIS — R42 Dizziness and giddiness: Secondary | ICD-10-CM | POA: Diagnosis present

## 2024-06-16 DIAGNOSIS — R2689 Other abnormalities of gait and mobility: Secondary | ICD-10-CM | POA: Insufficient documentation

## 2024-06-16 DIAGNOSIS — R262 Difficulty in walking, not elsewhere classified: Secondary | ICD-10-CM | POA: Diagnosis present

## 2024-06-16 DIAGNOSIS — M6281 Muscle weakness (generalized): Secondary | ICD-10-CM | POA: Diagnosis present

## 2024-06-16 DIAGNOSIS — H8113 Benign paroxysmal vertigo, bilateral: Secondary | ICD-10-CM | POA: Diagnosis present

## 2024-06-16 NOTE — Therapy (Signed)
 OUTPATIENT PHYSICAL THERAPY VESTIBULAR EVALUATION     Patient Name: Mary Malone MRN: [id number] DOB:Apr 16, 1946, 78 y.o., female Today's Date: 06/16/2024  END OF SESSION:  PT End of Session - 06/16/24 1323     Visit Number 1    Date for Recertification  08/07/24    Authorization Type [employer] Medicare    Progress Note Due on Visit 10    PT Start Time 1145    PT Stop Time 1235    PT Time Calculation (min) 50 min    Activity Tolerance Patient tolerated treatment well    Behavior During Therapy [facility] 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   Osteoporosis    PMB (postmenopausal bleeding) 12/2001   polyps   Serum calcium elevated 2021   sees Dr.Ellison/pulmonology with [place]   Thyroid  disease    hypothyroidism   URINARY INCONTINENCE    OAB   Wisdom teeth removed 1866/1967   Past Surgical History:  Procedure Laterality Date   BREAST CYST ASPIRATION  12/05/2001   CATARACT EXTRACTION Bilateral 07/2012, 09/2012   DILATION AND CURETTAGE OF UTERUS  2003   EYE SURGERY  2014   Cataracts   HYSTEROSCOPY  01/2002   resection, sm fibroid   Left knee surgey     MENISCUS REPAIR     left knee 2010   REFRACTIVE SURGERY  02/2016   WISDOM TOOTH EXTRACTION     Patient Active Problem List   Diagnosis Date Noted   Dizziness 06/15/2024   Right chronic serous otitis media 06/15/2024   Impacted cerumen of right ear 06/15/2024   Double vision 12/13/2023   Sensorineural hearing loss, 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   Chronic rhinitis 06/10/2023   Other specified disorders of  eustachian tube, right ear 06/10/2023   Thyroid  nodule 09/14/2022   Dyspnea on exertion 04/27/2022   Cervical radiculopathy 02/03/2021   Basal cell carcinoma (BCC) of lateral side wall of nose 11/09/2020   Hypercalcemia 04/20/2020   Numbness 04/19/2020   Left leg pain 07/08/2018   Sacral back pain 04/16/2018   Osteoporosis 08/09/2016   White matter abnormality on MRI of brain 07/11/2015   MCI (mild cognitive impairment) 07/11/2015   Routine general medical examination at a health care facility 02/11/2015   Anxiety and depression 11/01/2010   OAB (overactive bladder)    GERD 09/23/2007    PCP: Rollene Almarie LABOR, MD REFERRING PROVIDER: Karis Clunes, MD  REFERRING DIAG: R42 (ICD-10-CM) - Dizziness  THERAPY DIAG:  Dizziness and giddiness - Plan: PT plan of care cert/re-cert  Other abnormalities of gait and mobility - Plan: PT plan of care cert/re-cert  Muscle weakness (generalized) - Plan: PT plan of care cert/re-cert  Difficulty in walking, not elsewhere classified - Plan: PT plan of care cert/re-cert  BPPV (benign paroxysmal positional vertigo), bilateral - Plan: PT plan of care cert/re-cert  ONSET DATE: June 03, 2024  Rationale for Evaluation and  Treatment: Rehabilitation  SUBJECTIVE:   SUBJECTIVE STATEMENT: Patient reports that on June 03, 2024, she started having severe dizziness and had to lie flat for 4 hours until it started to subside.  She reports that the dizziness has improved some, but she is still having dizziness.  She reports that she constantly has dizziness, but at times, it is worse.  Patient reports dizziness with rolling over in bed.  Pt has increased build-up in her e Pt accompanied by: self  PERTINENT HISTORY:  Ataxia, Cerebellar atrophy, macular degeneration, diplopia, right ear eustachian tube dysfunction (scheduled for tympanostomy tube placement), osteopenia  PAIN:  Are you having pain? Yes: NPRS scale: 2/10 Pain location: cervical Pain  description: ache Aggravating factors: rotation Relieving factors: rest  PRECAUTIONS: Fall  RED FLAGS: None   WEIGHT BEARING RESTRICTIONS: No  FALLS: Has patient fallen in last 6 months? No  LIVING ENVIRONMENT: Lives with: lives alone Lives in: House/apartment Stairs: single level condo in [place] Has following equipment at home: Single point cane and Grab bars  PLOF: Independent and Leisure: walking with friends in community  PATIENT GOALS: To be able to return to walking with her friends in the community and decrease dizziness.  OBJECTIVE:  Note: Objective measures were completed at Evaluation unless otherwise noted.  DIAGNOSTIC FINDINGS:  Brain MRI on 03/15/2023: IMPRESSION: Unremarkable MRI of the head and orbits for patient age.  COGNITION: Overall cognitive status: Within functional limits for tasks assessed    POSTURE:  rounded shoulders and forward head  Cervical ROM:    Active A/ROM (deg) eval  Flexion 40  Extension 35  Right lateral flexion 25  Left lateral flexion 25  Right rotation 38  Left rotation 50  (Blank rows = not tested)  STRENGTH:  Eval: Bilateral hip strength grossly 3+/5 Bilateral quad strength of 5-/5 Bilateral shoulder strength 4-/5    GAIT: Gait pattern: ataxic Distance walked: >500 ft Assistive device utilized: Single point cane Level of assistance: Modified independence Comments: slower gait velocity  FUNCTIONAL TESTS:  Eval: 5 times sit to stand: 19.44 sec with UE use Timed up and go (TUG): 19.03 sec with SPC  PATIENT SURVEYS:  Eval: Dizziness Handicapped Inventory:  Total Score: 42 / 100  VESTIBULAR ASSESSMENT:    SYMPTOM BEHAVIOR:  Subjective history: Started on June 03, 2024 with severe dizziness  Non-Vestibular symptoms: changes in hearing, diplopia, neck pain, and tinnitus  Type of dizziness: Diplopia and Spinning/Vertigo  Frequency: daily  Duration: constant  Aggravating factors: Induced by  position change: rolling to the right and Induced by motion: turning head quickly  Relieving factors: head stationary and rest  Progression of symptoms: unchanged  OCULOMOTOR EXAM:  Ocular Alignment: normal  Ocular ROM: No Limitations  Spontaneous Nystagmus: absent  Gaze-Induced Nystagmus: absent  Smooth Pursuits: intact with horizontal movements, but saccades noted with vertical movements  Saccades: dysmetria     VESTIBULAR - OCULAR REFLEX:     Head-Impulse Test: HIT Right: positive HIT Left: positive     POSITIONAL TESTING: Right Dix-Hallpike: upbeating, right nystagmus Left Dix-Hallpike: slow moving nystagmus noted    OTHOSTATICS: not done  TREATMENT DATE:  06/16/2024   Canalith Repositioning:  Epley Right: Number of Reps: 1 and Response to Treatment: symptoms improved Educated on HEP  PATIENT EDUCATION: Education details: Issued HEP Person educated: Patient Education method: Explanation, Facilities manager, and Handouts Education comprehension: verbalized understanding and returned demonstration  HOME EXERCISE PROGRAM: Access Code: [facility] URL: [email or web address] Date: 06/16/2024 Prepared by: Jarrell Jemila Camille  Exercises - Seated Cervical Rotation AROM  - 1 x daily - 7 x weekly - 2 sets - 10 reps - Seated Scapular Retraction  - 1 x daily - 7 x weekly - 2 sets - 10 reps - Sit to Stand  - 1 x daily - 7 x weekly - 2 sets - 10 reps - Standing Marching  - 1 x daily - 7 x weekly - 2 sets - 10 reps  GOALS: Goals reviewed with patient? Yes  SHORT TERM GOALS: Target date: 07/10/2024  Patient will be compliant with initial HEP. Baseline: Goal status: INITIAL  2.  Patient will participate in a 6 min walk test to establish a baseline measurement. Baseline:  Goal status: INITIAL  3.  Patient will report a 30% improvement in  dizziness. Baseline:  Goal status: INITIAL   LONG TERM GOALS: Target date: 08/07/2024  Patient will be independent with advanced HEP to allow for self progression after discharge. Baseline:  Goal status: INITIAL  2.  Patient will improve Dizziness Handicapped Inventory to no greater than 15% to demonstrate improvement in functional tasks. Baseline: 42/100 Goal status: INITIAL  3.  Patient will report ability to walk with her community friends for >15 minutes with keeping up pace and 50% less fatigue. Baseline: reports slower gait and increased dyspnea Goal status: INITIAL  4.  Patient will increase her UE strength to [facility] to allow her to pick up her cat and place it in the carrier. Baseline:  Goal status: INITIAL  5.  Patient will increase LE strength to [facility] to allow her to return to gym at [facility] and perform weight machines. Baseline:  Goal status: INITIAL  6.  Patient will increase cervical ROM to [facility] to allow her to look both directions to more easily cross the road and less pain with driving. Baseline:  Goal status: INITIAL  ASSESSMENT:  CLINICAL IMPRESSION: Patient is a 78 y.o. female who was seen today for physical therapy evaluation and treatment for dizziness. Patient has a known history of vertigo, but states that it has recently gotten worse and she feels that some of it is due to the increased fluid in her right ear.  Patient reports that she gets dizzy with rolling over in bed and has difficulty with various tasks.  Patient also reports that she has been having a more difficult time keeping up with her friends on their walks around the community.  Patient with a positive Dix Hallpike during evaluation and treated with canalith repositioning using the Epley Maneuver.  Patient presents with increased dizziness, muscle weakness, neck pain, decreased balance, difficulty walking, and difficulty performing various tasks.    OBJECTIVE IMPAIRMENTS: Abnormal gait,  decreased balance, difficulty walking, decreased ROM, decreased strength, dizziness, impaired perceived functional ability, increased muscle spasms, impaired flexibility, postural dysfunction, and pain.   ACTIVITY LIMITATIONS: bending, standing, squatting, bed mobility, and locomotion level  PARTICIPATION LIMITATIONS: cleaning, laundry, driving, and community activity  PERSONAL FACTORS: Past/current experiences, Time since onset of injury/illness/exacerbation, and 3+ comorbidities: Osteopenia, Sensorinerual hearing loss, macular degeneration are also affecting patient's functional outcome.   REHAB POTENTIAL: Good  CLINICAL DECISION MAKING: Evolving/moderate complexity  EVALUATION COMPLEXITY: Moderate   PLAN:  PT FREQUENCY: 1-2x/week  PT DURATION: 8 weeks  PLANNED INTERVENTIONS: 97164- PT Re-evaluation, 97750- Physical Performance Testing, 97110-Therapeutic exercises, 97530- Therapeutic activity, 97112- Neuromuscular re-education, 97535- Self Care, 02859- Manual therapy, [phone]- Gait training, [phone]- Canalith repositioning, [id number]- Aquatic Therapy, [phone]- Electrical stimulation (unattended), [phone]- Electrical stimulation (manual), [id number]- Vasopneumatic device, [id number]- Ultrasound, [id number]- Traction (mechanical), [id number]- Ionotophoresis 4mg /ml Dexamethasone, 79439 (1-2 muscles), [id number] (3+ muscles)- Dry Needling, Patient/Family education, Balance training, Stair training, Taping, Joint mobilization, Joint manipulation, Spinal manipulation, Spinal mobilization, Vestibular training, Cryotherapy, and Moist heat  PLAN FOR NEXT SESSION: Assess and progress HEP as indicated, strengthening, flexibility, 6 minute walk, vestibular rehab   Jarrell Laming, PT, DPT 06/16/24, 4:05 PM  [facility] [address], [address] [place], [place] [address] Phone # [phone] Fax [phone]

## 2024-06-18 ENCOUNTER — Encounter (INDEPENDENT_AMBULATORY_CARE_PROVIDER_SITE_OTHER): Payer: Self-pay | Admitting: Otolaryngology

## 2024-06-18 ENCOUNTER — Ambulatory Visit (INDEPENDENT_AMBULATORY_CARE_PROVIDER_SITE_OTHER): Admitting: Otolaryngology

## 2024-06-18 VITALS — BP 163/74 | HR 68

## 2024-06-18 DIAGNOSIS — H6981 Other specified disorders of Eustachian tube, right ear: Secondary | ICD-10-CM | POA: Diagnosis not present

## 2024-06-18 DIAGNOSIS — H6521 Chronic serous otitis media, right ear: Secondary | ICD-10-CM | POA: Diagnosis not present

## 2024-06-18 HISTORY — PX: TYMPANOSTOMY TUBE PLACEMENT: SHX32

## 2024-06-18 NOTE — Progress Notes (Signed)
 Patient ID: Mary Malone, female   DOB: [birth year], 78 y.o.   MRN: [id number]  Procedure: Right myringotomy and tube placement.   Indication: Eustachian tube dysfunction and middle ear effusion, not responding to medical treatment.    Descriptions: The patient was placed supine on the exam table.  Under the operating microscope, the right ear canal was cleaned of all cerumen.  1% lidocaine was injected into all four quadrants of the ear canal. After adequate local anesthesia was achieved, a standard myringotomy incision was made at the anterior inferior quadrant of the tympanic membrane.  A copious amount of serous fluid was suctioned from behind the tympanic membrane.  A collar button tube was placed without difficulty.  The patient tolerated the procedure well.    Follow-up care: Dry ear precaution. The patient will return for reevaluation in approximately one month.

## 2024-06-23 ENCOUNTER — Ambulatory Visit: Admitting: Rehabilitative and Restorative Service Providers"

## 2024-06-24 ENCOUNTER — Encounter: Payer: Self-pay | Admitting: Rehabilitative and Restorative Service Providers"

## 2024-06-24 ENCOUNTER — Other Ambulatory Visit: Payer: Self-pay | Admitting: Obstetrics and Gynecology

## 2024-06-24 ENCOUNTER — Ambulatory Visit: Admitting: Rehabilitative and Restorative Service Providers"

## 2024-06-24 DIAGNOSIS — R2689 Other abnormalities of gait and mobility: Secondary | ICD-10-CM

## 2024-06-24 DIAGNOSIS — M6281 Muscle weakness (generalized): Secondary | ICD-10-CM

## 2024-06-24 DIAGNOSIS — H8113 Benign paroxysmal vertigo, bilateral: Secondary | ICD-10-CM

## 2024-06-24 DIAGNOSIS — R262 Difficulty in walking, not elsewhere classified: Secondary | ICD-10-CM

## 2024-06-24 DIAGNOSIS — Z1231 Encounter for screening mammogram for malignant neoplasm of breast: Secondary | ICD-10-CM

## 2024-06-24 DIAGNOSIS — R42 Dizziness and giddiness: Secondary | ICD-10-CM | POA: Diagnosis not present

## 2024-06-24 NOTE — Therapy (Signed)
 OUTPATIENT PHYSICAL THERAPY VESTIBULAR TREATMENT NOTE     Patient Name: Mary Malone MRN: [id number] DOB:1946-03-20, 78 y.o., female Today's Date: 06/24/2024  END OF SESSION:  PT End of Session - 06/24/24 0847     Visit Number 2    Date for Recertification  08/07/24    Authorization Type UHC Medicare    Progress Note Due on Visit 10    PT Start Time 0845    PT Stop Time 0925    PT Time Calculation (min) 40 min    Activity Tolerance Patient tolerated treatment well    Behavior During Therapy [facility] 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   Osteoporosis    PMB (postmenopausal bleeding) 12/2001   polyps   Serum calcium elevated 2021   sees Dr.Ellison/pulmonology with [place]   Thyroid  disease    hypothyroidism   URINARY INCONTINENCE    OAB   Wisdom teeth removed 1866/1967   Past Surgical History:  Procedure Laterality Date   BREAST CYST ASPIRATION  12/05/2001   CATARACT EXTRACTION Bilateral 07/2012, 09/2012   DILATION AND CURETTAGE OF UTERUS  2003   EYE SURGERY  2014   Cataracts   HYSTEROSCOPY  01/2002   resection, sm fibroid   Left knee surgey     MENISCUS REPAIR     left knee 2010   REFRACTIVE SURGERY  02/2016   TYMPANOSTOMY TUBE PLACEMENT Right 06/18/2024   WISDOM TOOTH EXTRACTION     Patient Active Problem List   Diagnosis Date Noted   Dizziness 06/15/2024   Right chronic serous otitis media 06/15/2024   Impacted cerumen of right ear 06/15/2024   Double vision 12/13/2023   Sensorineural hearing loss, 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   Chronic  rhinitis 06/10/2023   Other specified disorders of eustachian tube, right ear 06/10/2023   Thyroid  nodule 09/14/2022   Dyspnea on exertion 04/27/2022   Cervical radiculopathy 02/03/2021   Basal cell carcinoma (BCC) of lateral side wall of nose 11/09/2020   Hypercalcemia 04/20/2020   Numbness 04/19/2020   Left leg pain 07/08/2018   Sacral back pain 04/16/2018   Osteoporosis 08/09/2016   White matter abnormality on MRI of brain 07/11/2015   MCI (mild cognitive impairment) 07/11/2015   Routine general medical examination at a health care facility 02/11/2015   Anxiety and depression 11/01/2010   OAB (overactive bladder)    GERD 09/23/2007    PCP: Rollene Almarie LABOR, MD REFERRING PROVIDER: Karis Clunes, MD  REFERRING DIAG: R42 (ICD-10-CM) - Dizziness  THERAPY DIAG:  Dizziness and giddiness  Other abnormalities of gait and mobility  Muscle weakness (generalized)  Difficulty in walking, not elsewhere classified  BPPV (benign paroxysmal positional vertigo), bilateral  ONSET DATE: June 03, 2024  Rationale for Evaluation and Treatment: Rehabilitation  SUBJECTIVE:   SUBJECTIVE STATEMENT: Patient states that her ENT replaced her ear tube and they had to use the vacuum to suction out  the fluid build-up.  States that she is feeling some better.  Pt accompanied by: self  PERTINENT HISTORY:  Ataxia, Cerebellar atrophy, macular degeneration, diplopia, right ear eustachian tube dysfunction (scheduled for tympanostomy tube placement), osteopenia  PAIN:  06/24/24 Are you having pain? Yes: NPRS scale: 2/10 Pain location: cervical Pain description: ache Aggravating factors: rotation Relieving factors: rest  PRECAUTIONS: Fall  RED FLAGS: None   WEIGHT BEARING RESTRICTIONS: No  FALLS: Has patient fallen in last 6 months? No  LIVING ENVIRONMENT: Lives with: lives alone Lives in: House/apartment Stairs: single level condo in [place] Has following equipment at home:  Single point cane and Grab bars  PLOF: Independent and Leisure: walking with friends in community  PATIENT GOALS: To be able to return to walking with her friends in the community and decrease dizziness.  OBJECTIVE:  Note: Objective measures were completed at Evaluation unless otherwise noted.  DIAGNOSTIC FINDINGS:  Brain MRI on 03/15/2023: IMPRESSION: Unremarkable MRI of the head and orbits for patient age.  COGNITION: Overall cognitive status: Within functional limits for tasks assessed    POSTURE:  rounded shoulders and forward head  Cervical ROM:    Active A/ROM (deg) eval  Flexion 40  Extension 35  Right lateral flexion 25  Left lateral flexion 25  Right rotation 38  Left rotation 50  (Blank rows = not tested)  STRENGTH:  Eval: Bilateral hip strength grossly 3+/5 Bilateral quad strength of 5-/5 Bilateral shoulder strength 4-/5    GAIT: Gait pattern: ataxic Distance walked: >500 ft Assistive device utilized: Single point cane Level of assistance: Modified independence Comments: slower gait velocity  FUNCTIONAL TESTS:  Eval: 5 times sit to stand: 19.44 sec with UE use Timed up and go (TUG): 19.03 sec with SPC  06/24/2024: 6 minute walk test:  900 ft (age related norms are 1,393 ft)   PATIENT SURVEYS:  Eval: Dizziness Handicapped Inventory:  Total Score: 42 / 100  VESTIBULAR ASSESSMENT:    SYMPTOM BEHAVIOR:  Subjective history: Started on June 03, 2024 with severe dizziness  Non-Vestibular symptoms: changes in hearing, diplopia, neck pain, and tinnitus  Type of dizziness: Diplopia and Spinning/Vertigo  Frequency: daily  Duration: constant  Aggravating factors: Induced by position change: rolling to the right and Induced by motion: turning head quickly  Relieving factors: head stationary and rest  Progression of symptoms: unchanged  OCULOMOTOR EXAM:  Ocular Alignment: normal  Ocular ROM: No Limitations  Spontaneous Nystagmus:  absent  Gaze-Induced Nystagmus: absent  Smooth Pursuits: intact with horizontal movements, but saccades noted with vertical movements  Saccades: dysmetria     VESTIBULAR - OCULAR REFLEX:     Head-Impulse Test: HIT Right: positive HIT Left: positive     POSITIONAL TESTING: Right Dix-Hallpike: upbeating, right nystagmus Left Dix-Hallpike: slow moving nystagmus noted    OTHOSTATICS: not done  TREATMENT DATE:   06/24/2024: Nustep level 3 x4 min with PT present to discuss status 6 minute walk test:  900 ft Seated with 3# ankle weight:  LAQ and marching.  2x10 each bilat Dix Hallpike positive on the right (but less nystagmus than eval), proceeded with Epley Maneuver for right canalith repositioning x1 Seated smooth pursuit for horizontal and vertical x10 each Seated pencil push-ups x10   06/16/2024 Canalith Repositioning:  Epley Right: Number of Reps: 1 and Response to Treatment: symptoms improved Educated on HEP  PATIENT EDUCATION: Education details: Issued HEP Person educated: Patient Education method: Programmer, multimedia, Facilities manager, and Handouts Education comprehension: verbalized understanding and returned demonstration  HOME EXERCISE PROGRAM: Access Code: [facility] URL: [email or web address] Date: 06/24/2024 Prepared by: Jarrell Cimone Fahey  Exercises - Seated Cervical Rotation AROM  - 1 x daily - 7 x weekly - 2 sets - 10 reps - Seated Scapular Retraction  - 1 x daily - 7 x weekly - 2 sets - 10 reps - Sit to Stand  - 1 x daily - 7 x weekly - 2 sets - 10 reps - Standing Marching  - 1 x daily - 7 x weekly - 2 sets - 10 reps - Seated Horizontal Smooth Pursuit  - 1 x daily - 7 x weekly - 2 sets - 10 reps - Seated Vertical Smooth Pursuit  - 1 x daily - 7 x weekly - 2 sets - 10 reps - Seated Proximal-Distal Smooth Pursuit  - 1 x daily - 7 x  weekly - 2 sets - 10 reps - Brandt-Daroff Vestibular Exercise  - 1 x daily - 7 x weekly - 3-5 reps  GOALS: Goals reviewed with patient? Yes  SHORT TERM GOALS: Target date: 07/10/2024  Patient will be compliant with initial HEP. Baseline: Goal status: IN PROGRESS  2.  Patient will participate in a 6 min walk test to establish a baseline measurement. Baseline:  Goal status: MET on 06/24/24  3.  Patient will report a 30% improvement in dizziness. Baseline:  Goal status: IN PROGRESS   LONG TERM GOALS: Target date: 08/07/2024  Patient will be independent with advanced HEP to allow for self progression after discharge. Baseline:  Goal status: INITIAL  2.  Patient will improve Dizziness Handicapped Inventory to no greater than 15% to demonstrate improvement in functional tasks. Baseline: 42/100 Goal status: INITIAL  3.  Patient will report ability to walk with her community friends for >15 minutes with keeping up pace and 50% less fatigue. Baseline: reports slower gait and increased dyspnea Goal status: INITIAL  4.  Patient will increase her UE strength to [facility] to allow her to pick up her cat and place it in the carrier. Baseline:  Goal status: INITIAL  5.  Patient will increase LE strength to [facility] to allow her to return to gym at [facility] and perform weight machines. Baseline:  Goal status: INITIAL  6.  Patient will increase cervical ROM to [facility] to allow her to look both directions to more easily cross the road and less pain with driving. Baseline:  Goal status: INITIAL  ASSESSMENT:  CLINICAL IMPRESSION:  Ms Trunnell presents to skilled PT reporting that her dizziness did feel some better after her last visit and after getting her right ear tube placed.  Patient with less nystagmus noted on right [facility] today, but continued with canalith repositioning with Epley maneuver.  Patient reports that she does not feel like her eyes are moving in the same direction, so added  some smooth pursuit exercises to HEP to assist with oculomotor synchronization.  Patient provided with updated HEP to perform at home.  Patient continues to require skilled PT to progress towards goal related activities.  OBJECTIVE IMPAIRMENTS: Abnormal gait, decreased balance, difficulty walking, decreased ROM, decreased strength, dizziness, impaired perceived functional ability, increased muscle spasms, impaired flexibility, postural dysfunction, and pain.   ACTIVITY LIMITATIONS: bending, standing, squatting, bed mobility, and locomotion level  PARTICIPATION LIMITATIONS: cleaning, laundry, driving, and community activity  PERSONAL FACTORS: Past/current experiences, Time since onset of injury/illness/exacerbation, and 3+ comorbidities: Osteopenia, Sensorinerual hearing loss, macular degeneration are also affecting patient's functional outcome.   REHAB POTENTIAL: Good  CLINICAL DECISION MAKING: Evolving/moderate complexity  EVALUATION COMPLEXITY: Moderate   PLAN:  PT FREQUENCY: 1-2x/week  PT DURATION: 8 weeks  PLANNED INTERVENTIONS: 97164- PT Re-evaluation, 97750- Physical Performance Testing, 97110-Therapeutic exercises, 97530- Therapeutic activity, 97112- Neuromuscular re-education, 97535- Self Care, 02859- Manual therapy, [phone]- Gait training, [id number]- Canalith repositioning, [id number]- Aquatic Therapy, [phone]- Electrical stimulation (unattended), [phone]- Electrical stimulation (manual), [id number]- Vasopneumatic device, [id number]- Ultrasound, [id number]- Traction (mechanical), [id number]- Ionotophoresis 4mg /ml Dexamethasone, 79439 (1-2 muscles), 20561 (3+ muscles)- Dry Needling, Patient/Family education, Balance training, Stair training, Taping, Joint mobilization, Joint manipulation, Spinal manipulation, Spinal mobilization, Vestibular training, Cryotherapy, and Moist heat  PLAN FOR NEXT SESSION: Assess and progress HEP as indicated, strengthening, flexibility, 6 minute walk, vestibular rehab   Jarrell Laming,  PT, DPT 06/24/24, 9:57 AM  [facility] [address] [place], [place] [address] Phone # [phone] Fax [phone]

## 2024-06-27 ENCOUNTER — Other Ambulatory Visit (HOSPITAL_BASED_OUTPATIENT_CLINIC_OR_DEPARTMENT_OTHER): Payer: Self-pay

## 2024-06-30 ENCOUNTER — Encounter (HOSPITAL_BASED_OUTPATIENT_CLINIC_OR_DEPARTMENT_OTHER): Payer: Self-pay

## 2024-06-30 ENCOUNTER — Other Ambulatory Visit (HOSPITAL_BASED_OUTPATIENT_CLINIC_OR_DEPARTMENT_OTHER): Payer: Self-pay

## 2024-07-01 ENCOUNTER — Encounter: Payer: Self-pay | Admitting: Rehabilitative and Restorative Service Providers"

## 2024-07-01 ENCOUNTER — Other Ambulatory Visit (HOSPITAL_BASED_OUTPATIENT_CLINIC_OR_DEPARTMENT_OTHER): Payer: Self-pay

## 2024-07-01 ENCOUNTER — Ambulatory Visit: Admitting: Rehabilitative and Restorative Service Providers"

## 2024-07-01 DIAGNOSIS — M6281 Muscle weakness (generalized): Secondary | ICD-10-CM

## 2024-07-01 DIAGNOSIS — R2689 Other abnormalities of gait and mobility: Secondary | ICD-10-CM

## 2024-07-01 DIAGNOSIS — R42 Dizziness and giddiness: Secondary | ICD-10-CM

## 2024-07-01 DIAGNOSIS — R262 Difficulty in walking, not elsewhere classified: Secondary | ICD-10-CM

## 2024-07-01 DIAGNOSIS — H8113 Benign paroxysmal vertigo, bilateral: Secondary | ICD-10-CM

## 2024-07-01 NOTE — Therapy (Signed)
 OUTPATIENT PHYSICAL THERAPY VESTIBULAR TREATMENT NOTE     Patient Name: Mary Malone MRN: [id number] DOB:1945/10/16, 78 y.o., female Today's Date: 07/01/2024  END OF SESSION:  PT End of Session - 07/01/24 1236     Visit Number 3    Date for Recertification  08/07/24    Authorization Type [employer] Medicare ([employer] approved 06/16/2024 - 08/12/2024)    Authorization - Visit Number 3    Authorization - Number of Visits 16    Progress Note Due on Visit 10    PT Start Time 1230    PT Stop Time 1310    PT Time Calculation (min) 40 min    Activity Tolerance Patient tolerated treatment well    Behavior During Therapy [facility] 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   Osteoporosis    PMB (postmenopausal bleeding) 12/2001   polyps   Serum calcium elevated 2021   sees Dr.Ellison/pulmonology with [place]   Thyroid  disease    hypothyroidism   URINARY INCONTINENCE    OAB   Wisdom teeth removed 1866/1967   Past Surgical History:  Procedure Laterality Date   BREAST CYST ASPIRATION  12/05/2001   CATARACT EXTRACTION Bilateral 07/2012, 09/2012   DILATION AND CURETTAGE OF UTERUS  2003   EYE SURGERY  2014   Cataracts   HYSTEROSCOPY  01/2002   resection, sm fibroid   Left knee surgey     MENISCUS REPAIR     left knee 2010   REFRACTIVE SURGERY  02/2016   TYMPANOSTOMY TUBE PLACEMENT Right 06/18/2024   WISDOM TOOTH EXTRACTION     Patient Active Problem List   Diagnosis Date Noted   Dizziness 06/15/2024   Right chronic serous otitis media 06/15/2024   Impacted cerumen  of right ear 06/15/2024   Double vision 12/13/2023   Sensorineural hearing loss, 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   Chronic rhinitis 06/10/2023   Other specified disorders of eustachian tube, right ear 06/10/2023   Thyroid  nodule 09/14/2022   Dyspnea on exertion 04/27/2022   Cervical radiculopathy 02/03/2021   Basal cell carcinoma (BCC) of lateral side wall of nose 11/09/2020   Hypercalcemia 04/20/2020   Numbness 04/19/2020   Left leg pain 07/08/2018   Sacral back pain 04/16/2018   Osteoporosis 08/09/2016   White matter abnormality on MRI of brain 07/11/2015   MCI (mild cognitive impairment) 07/11/2015   Routine general medical examination at a health care facility 02/11/2015   Anxiety and depression 11/01/2010   OAB (overactive bladder)    GERD 09/23/2007    PCP: Rollene Almarie LABOR, MD REFERRING PROVIDER: Karis Clunes, MD  REFERRING DIAG: R42 (ICD-10-CM) - Dizziness  THERAPY DIAG:  Dizziness and giddiness  Other abnormalities of gait and mobility  Muscle weakness (generalized)  Difficulty in walking, not elsewhere classified  BPPV (benign paroxysmal positional vertigo), bilateral  ONSET DATE: June 03, 2024  Rationale for Evaluation and Treatment: Rehabilitation  SUBJECTIVE:  SUBJECTIVE STATEMENT: Patient reports that she is having increased dizziness today and that she would like to start with the Epley.  Pt did report that she felt better balanced for a few days after last visit.  Pt accompanied by: self  PERTINENT HISTORY:  Ataxia, Cerebellar atrophy, macular degeneration, diplopia, right ear eustachian tube dysfunction (scheduled for tympanostomy tube placement), osteopenia  PAIN:  07/01/24 Are you having pain? Yes: NPRS scale: 3/10 Pain location: cervical Pain description: ache Aggravating factors: rotation Relieving factors: rest  PRECAUTIONS: Fall  RED FLAGS: None   WEIGHT BEARING RESTRICTIONS: No  FALLS: Has patient fallen in last 6 months?  No  LIVING ENVIRONMENT: Lives with: lives alone Lives in: House/apartment Stairs: single level condo in [place] Has following equipment at home: Single point cane and Grab bars  PLOF: Independent and Leisure: walking with friends in community  PATIENT GOALS: To be able to return to walking with her friends in the community and decrease dizziness.  OBJECTIVE:  Note: Objective measures were completed at Evaluation unless otherwise noted.  DIAGNOSTIC FINDINGS:  Brain MRI on 03/15/2023: IMPRESSION: Unremarkable MRI of the head and orbits for patient age.  COGNITION: Overall cognitive status: Within functional limits for tasks assessed    POSTURE:  rounded shoulders and forward head  Cervical ROM:    Active A/ROM (deg) eval  Flexion 40  Extension 35  Right lateral flexion 25  Left lateral flexion 25  Right rotation 38  Left rotation 50  (Blank rows = not tested)  STRENGTH:  Eval: Bilateral hip strength grossly 3+/5 Bilateral quad strength of 5-/5 Bilateral shoulder strength 4-/5    GAIT: Gait pattern: ataxic Distance walked: >500 ft Assistive device utilized: Single point cane Level of assistance: Modified independence Comments: slower gait velocity  FUNCTIONAL TESTS:  Eval: 5 times sit to stand: 19.44 sec with UE use Timed up and go (TUG): 19.03 sec with SPC  06/24/2024: 6 minute walk test:  900 ft (age related norms are 1,393 ft)   PATIENT SURVEYS:  Eval: Dizziness Handicapped Inventory:  Total Score: 42 / 100  VESTIBULAR ASSESSMENT:    SYMPTOM BEHAVIOR:  Subjective history: Started on June 03, 2024 with severe dizziness  Non-Vestibular symptoms: changes in hearing, diplopia, neck pain, and tinnitus  Type of dizziness: Diplopia and Spinning/Vertigo  Frequency: daily  Duration: constant  Aggravating factors: Induced by position change: rolling to the right and Induced by motion: turning head quickly  Relieving factors: head stationary and  rest  Progression of symptoms: unchanged  OCULOMOTOR EXAM:  Ocular Alignment: normal  Ocular ROM: No Limitations  Spontaneous Nystagmus: absent  Gaze-Induced Nystagmus: absent  Smooth Pursuits: intact with horizontal movements, but saccades noted with vertical movements  Saccades: dysmetria     VESTIBULAR - OCULAR REFLEX:     Head-Impulse Test: HIT Right: positive HIT Left: positive     POSITIONAL TESTING: Right Dix-Hallpike: upbeating, right nystagmus Left Dix-Hallpike: slow moving nystagmus noted    OTHOSTATICS: not done  TREATMENT DATE:   07/01/2024: Trenda Craze positive on the right, proceeded with Epley Maneuver for right canalith repositioning x1 Education about balance and the factors that lead to improved balance and the roll of the vestibular system Seated with 3# ankle weight:  LAQ, marching, and heel/toe raises.  2x10 each bilat Nustep level 4 x4 min with PT present to discuss status Leg Press (seat at 6) 50# 2x10   06/24/2024: Nustep level 3 x4 min with PT present to discuss status 6 minute walk test:  900 ft Seated with 3# ankle weight:  LAQ and marching.  2x10 each bilat Dix Hallpike positive on the right (but less nystagmus than eval), proceeded with Epley Maneuver for right canalith repositioning x1 Seated smooth pursuit for horizontal and vertical x10 each Seated pencil push-ups x10   06/16/2024 Canalith Repositioning:  Epley Right: Number of Reps: 1 and Response to Treatment: symptoms improved Educated on HEP  PATIENT EDUCATION: Education details: Issued HEP Person educated: Patient Education method: Programmer, Multimedia, Facilities Manager, and Handouts Education comprehension: verbalized understanding and returned demonstration  HOME EXERCISE PROGRAM: Access Code: [facility] URL: [email or web address] Date:  06/24/2024 Prepared by: Jarrell Miley Lindon  Exercises - Seated Cervical Rotation AROM  - 1 x daily - 7 x weekly - 2 sets - 10 reps - Seated Scapular Retraction  - 1 x daily - 7 x weekly - 2 sets - 10 reps - Sit to Stand  - 1 x daily - 7 x weekly - 2 sets - 10 reps - Standing Marching  - 1 x daily - 7 x weekly - 2 sets - 10 reps - Seated Horizontal Smooth Pursuit  - 1 x daily - 7 x weekly - 2 sets - 10 reps - Seated Vertical Smooth Pursuit  - 1 x daily - 7 x weekly - 2 sets - 10 reps - Seated Proximal-Distal Smooth Pursuit  - 1 x daily - 7 x weekly - 2 sets - 10 reps - Brandt-Daroff Vestibular Exercise  - 1 x daily - 7 x weekly - 3-5 reps  GOALS: Goals reviewed with patient? Yes  SHORT TERM GOALS: Target date: 07/10/2024  Patient will be compliant with initial HEP. Baseline: Goal status: IN PROGRESS  2.  Patient will participate in a 6 min walk test to establish a baseline measurement. Baseline:  Goal status: MET on 06/24/24  3.  Patient will report a 30% improvement in dizziness. Baseline:  Goal status: IN PROGRESS   LONG TERM GOALS: Target date: 08/07/2024  Patient will be independent with advanced HEP to allow for self progression after discharge. Baseline:  Goal status: INITIAL  2.  Patient will improve Dizziness Handicapped Inventory to no greater than 15% to demonstrate improvement in functional tasks. Baseline: 42/100 Goal status: INITIAL  3.  Patient will report ability to walk with her community friends for >15 minutes with keeping up pace and 50% less fatigue. Baseline: reports slower gait and increased dyspnea Goal status: INITIAL  4.  Patient will increase her UE strength to [facility] to allow her to pick up her cat and place it in the carrier. Baseline:  Goal status: INITIAL  5.  Patient will increase LE strength to [facility] to allow her to return to gym at [facility] and perform weight machines. Baseline:  Goal status: INITIAL  6.  Patient will increase cervical ROM  to [facility] to allow her to look both directions to more easily cross the road and less pain with driving. Baseline:  Goal status: INITIAL  ASSESSMENT:  CLINICAL IMPRESSION:  Ms Brau presents to skilled PT reporting that her dizziness initially was better, but today, she is having a lot of dizziness.  Patient with unsteady gait with SPC at the beginning of the session.  Patient with negative Trenda Craze on the left (had some dizziness, no nystagmus) and positive Dix Hallpike on the right side and proceeded to canalith repositioning.  Patient reports that her dizziness is feeling a little better after canalith repositioning.  Patient able to progress with strengthening and able to add in leg press at end of session.  Patient with some difficulty with control of leg press and required cuing to not hold her breath.  Patient continues to require skilled PT to progress towards goal related activities.  OBJECTIVE IMPAIRMENTS: Abnormal gait, decreased balance, difficulty walking, decreased ROM, decreased strength, dizziness, impaired perceived functional ability, increased muscle spasms, impaired flexibility, postural dysfunction, and pain.   ACTIVITY LIMITATIONS: bending, standing, squatting, bed mobility, and locomotion level  PARTICIPATION LIMITATIONS: cleaning, laundry, driving, and community activity  PERSONAL FACTORS: Past/current experiences, Time since onset of injury/illness/exacerbation, and 3+ comorbidities: Osteopenia, Sensorinerual hearing loss, macular degeneration are also affecting patient's functional outcome.   REHAB POTENTIAL: Good  CLINICAL DECISION MAKING: Evolving/moderate complexity  EVALUATION COMPLEXITY: Moderate   PLAN:  PT FREQUENCY: 1-2x/week  PT DURATION: 8 weeks  PLANNED INTERVENTIONS: 97164- PT Re-evaluation, 97750- Physical Performance Testing, 97110-Therapeutic exercises, 97530- Therapeutic activity, 97112- Neuromuscular re-education, 97535- Self Care, [id number]-  Manual therapy, [phone]- Gait training, [phone]- Canalith repositioning, [id number]- Aquatic Therapy, [phone]- Electrical stimulation (unattended), [phone]- Electrical stimulation (manual), [id number]- Vasopneumatic device, [id number]- Ultrasound, [id number]- Traction (mechanical), [id number]- Ionotophoresis 4mg /ml Dexamethasone, [id number] (1-2 muscles), [id number] (3+ muscles)- Dry Needling, Patient/Family education, Balance training, Stair training, Taping, Joint mobilization, Joint manipulation, Spinal manipulation, Spinal mobilization, Vestibular training, Cryotherapy, and Moist heat  PLAN FOR NEXT SESSION: Assess and progress HEP as indicated, strengthening, flexibility, vestibular rehab   Jarrell Laming, PT, DPT 07/01/24, 1:25 PM  [facility] [address] [place], [place] [address] Phone # [phone] Fax [phone]

## 2024-07-08 ENCOUNTER — Ambulatory Visit: Admitting: Rehabilitative and Restorative Service Providers"

## 2024-07-15 ENCOUNTER — Ambulatory Visit: Attending: Otolaryngology | Admitting: Rehabilitative and Restorative Service Providers"

## 2024-07-15 ENCOUNTER — Encounter: Payer: Self-pay | Admitting: Rehabilitative and Restorative Service Providers"

## 2024-07-15 DIAGNOSIS — R2689 Other abnormalities of gait and mobility: Secondary | ICD-10-CM | POA: Insufficient documentation

## 2024-07-15 DIAGNOSIS — H8113 Benign paroxysmal vertigo, bilateral: Secondary | ICD-10-CM | POA: Insufficient documentation

## 2024-07-15 DIAGNOSIS — M6281 Muscle weakness (generalized): Secondary | ICD-10-CM | POA: Insufficient documentation

## 2024-07-15 DIAGNOSIS — R262 Difficulty in walking, not elsewhere classified: Secondary | ICD-10-CM | POA: Insufficient documentation

## 2024-07-15 DIAGNOSIS — R42 Dizziness and giddiness: Secondary | ICD-10-CM | POA: Insufficient documentation

## 2024-07-15 NOTE — Therapy (Signed)
 OUTPATIENT PHYSICAL THERAPY VESTIBULAR TREATMENT NOTE     Patient Name: Mary Malone MRN: [id number] DOB:[birth year], 78 y.o., female Today's Date: 07/15/2024  END OF SESSION:  PT End of Session - 07/15/24 1148     Visit Number 4    Date for Recertification  08/07/24    Authorization Type [employer] Medicare ([employer] approved 06/16/2024 - 08/12/2024)    Authorization - Visit Number 4    Authorization - Number of Visits 16    Progress Note Due on Visit 10    PT Start Time 1145    PT Stop Time 1225    PT Time Calculation (min) 40 min    Activity Tolerance Patient tolerated treatment well    Behavior During Therapy [facility] 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   Osteoporosis    PMB (postmenopausal bleeding) 12/2001   polyps   Serum calcium elevated 2021   sees Dr.Ellison/pulmonology with [place]   Thyroid  disease    hypothyroidism   URINARY INCONTINENCE    OAB   Wisdom teeth removed 1866/1967   Past Surgical History:  Procedure Laterality Date   BREAST CYST ASPIRATION  12/05/2001   CATARACT EXTRACTION Bilateral 07/2012, 09/2012   DILATION AND CURETTAGE OF UTERUS  2003   EYE SURGERY  2014   Cataracts   HYSTEROSCOPY  01/2002   resection, sm fibroid   Left knee surgey     MENISCUS REPAIR     left knee 2010   REFRACTIVE SURGERY  02/2016   TYMPANOSTOMY TUBE PLACEMENT Right 06/18/2024   WISDOM TOOTH EXTRACTION     Patient Active Problem List   Diagnosis Date Noted   Dizziness 06/15/2024   Right chronic serous otitis media 06/15/2024   Impacted cerumen  of right ear 06/15/2024   Double vision 12/13/2023   Sensorineural hearing loss, 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   Chronic rhinitis 06/10/2023   Other specified disorders of eustachian tube, right ear 06/10/2023   Thyroid  nodule 09/14/2022   Dyspnea on exertion 04/27/2022   Cervical radiculopathy 02/03/2021   Basal cell carcinoma (BCC) of lateral side wall of nose 11/09/2020   Hypercalcemia 04/20/2020   Numbness 04/19/2020   Left leg pain 07/08/2018   Sacral back pain 04/16/2018   Osteoporosis 08/09/2016   White matter abnormality on MRI of brain 07/11/2015   MCI (mild cognitive impairment) 07/11/2015   Routine general medical examination at a health care facility 02/11/2015   Anxiety and depression 11/01/2010   OAB (overactive bladder)    GERD 09/23/2007    PCP: Rollene Almarie LABOR, MD REFERRING PROVIDER: Karis Clunes, MD  REFERRING DIAG: R42 (ICD-10-CM) - Dizziness  THERAPY DIAG:  Dizziness and giddiness  Other abnormalities of gait and mobility  Muscle weakness (generalized)  Difficulty in walking, not elsewhere classified  BPPV (benign paroxysmal positional vertigo), bilateral  ONSET DATE: June 03, 2024  Rationale for Evaluation and Treatment: Rehabilitation  SUBJECTIVE:  SUBJECTIVE STATEMENT: Patient reports that her brother is going to inpatient rehab today and that he is doing better.  Patient reports that she felt better for a couple of days after the last visit, but she is feeling dizzy today.  Patient admits that she has not been doing her HEP secondary to being so busy with her brother in the hospital.  Pt accompanied by: self  PERTINENT HISTORY:  Ataxia, Cerebellar atrophy, macular degeneration, diplopia, right ear eustachian tube dysfunction (scheduled for tympanostomy tube placement), osteopenia  PAIN:  Are you having pain? Yes: NPRS scale: 2-3/10 Pain location: headache and low back Pain description: ache Aggravating factors: rotation Relieving factors:  rest  PRECAUTIONS: Fall  RED FLAGS: None   WEIGHT BEARING RESTRICTIONS: No  FALLS: Has patient fallen in last 6 months? No  LIVING ENVIRONMENT: Lives with: lives alone Lives in: House/apartment Stairs: single level condo in [place] Has following equipment at home: Single point cane and Grab bars  PLOF: Independent and Leisure: walking with friends in community  PATIENT GOALS: To be able to return to walking with her friends in the community and decrease dizziness.  OBJECTIVE:  Note: Objective measures were completed at Evaluation unless otherwise noted.  DIAGNOSTIC FINDINGS:  Brain MRI on 03/15/2023: IMPRESSION: Unremarkable MRI of the head and orbits for patient age.  COGNITION: Overall cognitive status: Within functional limits for tasks assessed    POSTURE:  rounded shoulders and forward head  Cervical ROM:    Active A/ROM (deg) eval  Flexion 40  Extension 35  Right lateral flexion 25  Left lateral flexion 25  Right rotation 38  Left rotation 50  (Blank rows = not tested)  STRENGTH:  Eval: Bilateral hip strength grossly 3+/5 Bilateral quad strength of 5-/5 Bilateral shoulder strength 4-/5    GAIT: Gait pattern: ataxic Distance walked: >500 ft Assistive device utilized: Single point cane Level of assistance: Modified independence Comments: slower gait velocity  FUNCTIONAL TESTS:  Eval: 5 times sit to stand: 19.44 sec with UE use Timed up and go (TUG): 19.03 sec with SPC  06/24/2024: 6 minute walk test:  900 ft (age related norms are 1,393 ft)   PATIENT SURVEYS:  Eval: Dizziness Handicapped Inventory:  Total Score: 42 / 100  VESTIBULAR ASSESSMENT:    SYMPTOM BEHAVIOR:  Subjective history: Started on June 03, 2024 with severe dizziness  Non-Vestibular symptoms: changes in hearing, diplopia, neck pain, and tinnitus  Type of dizziness: Diplopia and Spinning/Vertigo  Frequency: daily  Duration: constant  Aggravating factors:  Induced by position change: rolling to the right and Induced by motion: turning head quickly  Relieving factors: head stationary and rest  Progression of symptoms: unchanged  OCULOMOTOR EXAM:  Ocular Alignment: normal  Ocular ROM: No Limitations  Spontaneous Nystagmus: absent  Gaze-Induced Nystagmus: absent  Smooth Pursuits: intact with horizontal movements, but saccades noted with vertical movements  Saccades: dysmetria     VESTIBULAR - OCULAR REFLEX:     Head-Impulse Test: HIT Right: positive HIT Left: positive     POSITIONAL TESTING: Right Dix-Hallpike: upbeating, right nystagmus Left Dix-Hallpike: slow moving nystagmus noted    OTHOSTATICS: not done  TREATMENT DATE:   07/15/2022: Nustep level 4 x5 min with PT present to discuss status Seated with 3# ankle weight:  LAQ, marching, and heel/toe raises.  2x10 each bilat Standing on foam pad performing slow horizontal head rotations with CGA 2x1 min Leg Press (seat at 6) 50# 2x10 Dix Hallpike positive on the right, proceeded with Epley Maneuver for right canalith repositioning x1   07/01/2024: Trenda Craze positive on the right, proceeded with Epley Maneuver for right canalith repositioning x1 Education about balance and the factors that lead to improved balance and the roll of the vestibular system Seated with 3# ankle weight:  LAQ, marching, and heel/toe raises.  2x10 each bilat Nustep level 4 x4 min with PT present to discuss status Leg Press (seat at 6) 50# 2x10   06/24/2024: Nustep level 3 x4 min with PT present to discuss status 6 minute walk test:  900 ft Seated with 3# ankle weight:  LAQ and marching.  2x10 each bilat Dix Hallpike positive on the right (but less nystagmus than eval), proceeded with Epley Maneuver for right canalith repositioning x1 Seated smooth pursuit for horizontal and  vertical x10 each Seated pencil push-ups x10   PATIENT EDUCATION: Education details: Issued HEP Person educated: Patient Education method: Explanation, Demonstration, and Handouts Education comprehension: verbalized understanding and returned demonstration  HOME EXERCISE PROGRAM: Access Code: [facility] URL: [email or web address] Date: 06/24/2024 Prepared by: Jarrell Edwen Mclester  Exercises - Seated Cervical Rotation AROM  - 1 x daily - 7 x weekly - 2 sets - 10 reps - Seated Scapular Retraction  - 1 x daily - 7 x weekly - 2 sets - 10 reps - Sit to Stand  - 1 x daily - 7 x weekly - 2 sets - 10 reps - Standing Marching  - 1 x daily - 7 x weekly - 2 sets - 10 reps - Seated Horizontal Smooth Pursuit  - 1 x daily - 7 x weekly - 2 sets - 10 reps - Seated Vertical Smooth Pursuit  - 1 x daily - 7 x weekly - 2 sets - 10 reps - Seated Proximal-Distal Smooth Pursuit  - 1 x daily - 7 x weekly - 2 sets - 10 reps - Brandt-Daroff Vestibular Exercise  - 1 x daily - 7 x weekly - 3-5 reps  GOALS: Goals reviewed with patient? Yes  SHORT TERM GOALS: Target date: 07/10/2024  Patient will be compliant with initial HEP. Baseline: Goal status: IN PROGRESS  2.  Patient will participate in a 6 min walk test to establish a baseline measurement. Baseline:  Goal status: MET on 06/24/24  3.  Patient will report a 30% improvement in dizziness. Baseline:  Goal status: IN PROGRESS   LONG TERM GOALS: Target date: 08/07/2024  Patient will be independent with advanced HEP to allow for self progression after discharge. Baseline:  Goal status: INITIAL  2.  Patient will improve Dizziness Handicapped Inventory to no greater than 15% to demonstrate improvement in functional tasks. Baseline: 42/100 Goal status: INITIAL  3.  Patient will report ability to walk with her community friends for >15 minutes with keeping up pace and 50% less fatigue. Baseline: reports slower gait and increased dyspnea Goal  status: INITIAL  4.  Patient will increase her UE strength to [facility] to allow her to pick up her cat and place it in the carrier. Baseline:  Goal status: INITIAL  5.  Patient will increase LE strength to [facility] to allow her to return to gym at  [employer] and perform weight machines. Baseline:  Goal status: INITIAL  6.  Patient will increase cervical ROM to [facility] to allow her to look both directions to more easily cross the road and less pain with driving. Baseline:  Goal status: INITIAL  ASSESSMENT:  CLINICAL IMPRESSION:  Ms Hirota presents to skilled PT reporting that her dizziness is better for a couple of days after treatment.  However, patient admits that she has been so dizzy with her brother being in the hospital, that she has not been doing any of her exercises.  Educated patient of the importance of working in some of the exercises to assist with vestibular rehab.  Patient continues with positive Trenda Craze on right side, so proceeded with treatment with Epley maneuver.  Patient continues to require skilled PT to progress towards goal related activities.  OBJECTIVE IMPAIRMENTS: Abnormal gait, decreased balance, difficulty walking, decreased ROM, decreased strength, dizziness, impaired perceived functional ability, increased muscle spasms, impaired flexibility, postural dysfunction, and pain.   ACTIVITY LIMITATIONS: bending, standing, squatting, bed mobility, and locomotion level  PARTICIPATION LIMITATIONS: cleaning, laundry, driving, and community activity  PERSONAL FACTORS: Past/current experiences, Time since onset of injury/illness/exacerbation, and 3+ comorbidities: Osteopenia, Sensorinerual hearing loss, macular degeneration are also affecting patient's functional outcome.   REHAB POTENTIAL: Good  CLINICAL DECISION MAKING: Evolving/moderate complexity  EVALUATION COMPLEXITY: Moderate   PLAN:  PT FREQUENCY: 1-2x/week  PT DURATION: 8 weeks  PLANNED INTERVENTIONS: 97164-  PT Re-evaluation, 97750- Physical Performance Testing, 97110-Therapeutic exercises, 97530- Therapeutic activity, 97112- Neuromuscular re-education, 97535- Self Care, 02859- Manual therapy, [phone]- Gait training, [phone]- Canalith repositioning, [id number]- Aquatic Therapy, [phone]- Electrical stimulation (unattended), [phone]- Electrical stimulation (manual), [id number]- Vasopneumatic device, [id number]- Ultrasound, [id number]- Traction (mechanical), [id number]- Ionotophoresis 4mg /ml Dexamethasone, 79439 (1-2 muscles), [id number] (3+ muscles)- Dry Needling, Patient/Family education, Balance training, Stair training, Taping, Joint mobilization, Joint manipulation, Spinal manipulation, Spinal mobilization, Vestibular training, Cryotherapy, and Moist heat  PLAN FOR NEXT SESSION: Assess and progress HEP as indicated, strengthening, flexibility, vestibular rehab   Jarrell Laming, PT, DPT 07/15/24, 1:30 PM  [facility] [address] [place], [place] [address] Phone # [phone] Fax [phone]

## 2024-07-21 ENCOUNTER — Encounter (INDEPENDENT_AMBULATORY_CARE_PROVIDER_SITE_OTHER): Payer: Self-pay | Admitting: Otolaryngology

## 2024-07-21 ENCOUNTER — Ambulatory Visit (INDEPENDENT_AMBULATORY_CARE_PROVIDER_SITE_OTHER): Admitting: Otolaryngology

## 2024-07-21 VITALS — BP 138/73 | HR 76 | Ht 64.0 in | Wt 135.0 lb

## 2024-07-21 DIAGNOSIS — H532 Diplopia: Secondary | ICD-10-CM | POA: Diagnosis not present

## 2024-07-21 DIAGNOSIS — J309 Allergic rhinitis, unspecified: Secondary | ICD-10-CM | POA: Diagnosis not present

## 2024-07-21 DIAGNOSIS — R42 Dizziness and giddiness: Secondary | ICD-10-CM | POA: Diagnosis not present

## 2024-07-21 DIAGNOSIS — J310 Chronic rhinitis: Secondary | ICD-10-CM

## 2024-07-21 DIAGNOSIS — H903 Sensorineural hearing loss, bilateral: Secondary | ICD-10-CM | POA: Diagnosis not present

## 2024-07-21 DIAGNOSIS — H6981 Other specified disorders of Eustachian tube, right ear: Secondary | ICD-10-CM

## 2024-07-21 NOTE — Progress Notes (Unsigned)
 Patient ID: Mary Malone, female   DOB: 09-29-1945, 78 y.o.   MRN: [id number]  Follow up: Recurrent dizziness, hearing loss, right ear eustachian tube dysfunction  Discussed the use of AI scribe software for clinical note transcription with the patient, who gave verbal consent to proceed.  History of Present Illness Mary Malone is a 78 year old female who presents for her follow-up evaluation.  The patient has a history of right ear eustachian tube dysfunction.  She underwent revision right myringotomy and tube placement 1 month ago.  She returns today reporting no improvement in hearing since the ear tube placement. She reports no drainage from the ear. She uses a hearing aid and has an upcoming appointment to check her hearing.  She continues to experience dizziness and imbalance, describing both spinning sensations and feeling off balance. She is seeing a physical therapist for these symptoms. The therapist performs the Epley maneuver weekly and provides exercises for home practice. She also reports double vision. Attempts to correct this with prism  glasses have been unsuccessful. She has a history of ataxia, and was previously diagnosed with cerebellar atrophy.  She also has a history of chronic rhinitis and chronic nasal congestion.  She discusses her use of Dymista , which is no longer manufactured, and has switched to using azelastine  and Flonase, both of which are generic and over-the-counter. She sprays one and then the other 30 minutes later.  Exam: General: Communicates without difficulty, well nourished, no acute distress. Head: Normocephalic, no evidence injury, no tenderness, facial buttresses intact without stepoff. Face/sinus: No tenderness to palpation and percussion. Facial movement is normal and symmetric. Eyes: PERRL, EOMI. No scleral icterus, conjunctivae clear. Neuro: CN II exam reveals vision grossly intact.  No nystagmus at any point of gaze. Ears: Auricles well formed  without lesions.  Ear canals are intact without mass or lesion.  No erythema or edema is appreciated.  The right ear ventilating tube is in place and patent.  Nose: External evaluation reveals normal support and skin without lesions.  Dorsum is intact.  Anterior rhinoscopy reveals congested mucosa over anterior aspect of inferior turbinates and intact septum.  No purulence noted. Oral:  Oral cavity and oropharynx are intact, symmetric, without erythema or edema.  Mucosa is moist without lesions. Neck: Full range of motion without pain.  There is no significant lymphadenopathy.  No masses palpable.  Thyroid  bed within normal limits to palpation.  Parotid glands and submandibular glands equal bilaterally without mass.  Trachea is midline. Neuro:  CN 2-12 grossly intact.  Vestibular: No nystagmus at any point of gaze.Vestibular: There is no nystagmus with pneumatic pressure on either tympanic membrane or Valsalva. The cerebellar examination is unremarkable.   Assessment and Plan Assessment & Plan Bilateral sensorineural hearing loss status post tympanostomy tube placement Hearing loss persists post-tympanostomy tube placement.  The right ventilating tube is in place and patent.  No drainage is noted.  Hearing loss likely due to sensorineural etiology rather than fluid accumulation. - Scheduled hearing test to assess current hearing status and determine if hearing aid adjustments are needed.  Dizziness and imbalance Experiencing dizziness and imbalance, described as both spinning and off-balance sensations. Undergoing physical therapy with maneuvers and home exercises. Double vision complicates balance issues.  - Continue physical therapy with maneuvers and home exercises. - Emphasized importance of home exercises for balance improvement.  Allergic rhinitis Previously managed with Dymista , now discontinued. Currently using azelastine  and Flonase as individual components. . - Continue using azelastine  and  Flonase, alternating with a 30-minute interval between sprays.

## 2024-07-22 ENCOUNTER — Encounter: Payer: Self-pay | Admitting: Rehabilitative and Restorative Service Providers"

## 2024-07-22 ENCOUNTER — Ambulatory Visit: Admitting: Rehabilitative and Restorative Service Providers"

## 2024-07-22 DIAGNOSIS — R2689 Other abnormalities of gait and mobility: Secondary | ICD-10-CM

## 2024-07-22 DIAGNOSIS — H8113 Benign paroxysmal vertigo, bilateral: Secondary | ICD-10-CM

## 2024-07-22 DIAGNOSIS — R42 Dizziness and giddiness: Secondary | ICD-10-CM

## 2024-07-22 DIAGNOSIS — M6281 Muscle weakness (generalized): Secondary | ICD-10-CM

## 2024-07-22 DIAGNOSIS — R262 Difficulty in walking, not elsewhere classified: Secondary | ICD-10-CM

## 2024-07-22 NOTE — Therapy (Signed)
 OUTPATIENT PHYSICAL THERAPY VESTIBULAR TREATMENT NOTE  AND REASSESSMENT NOTE     Patient Name: Mary Malone MRN: [id number] DOB:1946-05-08, 78 y.o., female Today's Date: 07/22/2024  END OF SESSION:  PT End of Session - 07/22/24 1152     Visit Number 5    Date for Recertification  10/02/24    Authorization Type [employer] Medicare ([employer] approved 06/16/2024 - 08/12/2024)    Authorization - Visit Number 5    Authorization - Number of Visits 16    Progress Note Due on Visit 15    PT Start Time 1147    PT Stop Time 1225    PT Time Calculation (min) 38 min    Activity Tolerance Patient tolerated treatment well    Behavior During Therapy WFL 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   Osteoporosis    PMB (postmenopausal bleeding) 12/2001   polyps   Serum calcium elevated 2021   sees Dr.Ellison/pulmonology with [place]   Thyroid  disease    hypothyroidism   URINARY INCONTINENCE    OAB   Wisdom teeth removed 1866/1967   Past Surgical History:  Procedure Laterality Date   BREAST CYST ASPIRATION  12/05/2001   CATARACT EXTRACTION Bilateral 07/2012, 09/2012   DILATION AND CURETTAGE OF UTERUS  2003   EYE SURGERY  2014   Cataracts   HYSTEROSCOPY  01/2002   resection, sm fibroid   Left knee surgey     MENISCUS REPAIR     left knee 2010   REFRACTIVE SURGERY  02/2016   TYMPANOSTOMY TUBE PLACEMENT Right 06/18/2024   WISDOM TOOTH EXTRACTION     Patient Active Problem List   Diagnosis Date Noted   Dizziness 06/15/2024   Right chronic serous otitis media  06/15/2024   Impacted cerumen of right ear 06/15/2024   Double vision 12/13/2023   Sensorineural hearing loss, 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   Chronic rhinitis 06/10/2023   Other specified disorders of eustachian tube, right ear 06/10/2023   Thyroid  nodule 09/14/2022   Dyspnea on exertion 04/27/2022   Cervical radiculopathy 02/03/2021   Basal cell carcinoma (BCC) of lateral side wall of nose 11/09/2020   Hypercalcemia 04/20/2020   Numbness 04/19/2020   Left leg pain 07/08/2018   Sacral back pain 04/16/2018   Osteoporosis 08/09/2016   White matter abnormality on MRI of brain 07/11/2015   MCI (mild cognitive impairment) 07/11/2015   Routine general medical examination at a health care facility 02/11/2015   Anxiety and depression 11/01/2010   OAB (overactive bladder)    GERD 09/23/2007    PCP: Rollene Almarie LABOR, MD REFERRING PROVIDER: Karis Clunes, MD  REFERRING DIAG: R42 (ICD-10-CM) - Dizziness  THERAPY DIAG:  Dizziness and giddiness - Plan: PT plan of care cert/re-cert  Other abnormalities of gait and mobility - Plan: PT plan of care cert/re-cert  Muscle weakness (generalized) - Plan: PT plan of care cert/re-cert  Difficulty in walking, not  elsewhere classified - Plan: PT plan of care cert/re-cert  BPPV (benign paroxysmal positional vertigo), bilateral - Plan: PT plan of care cert/re-cert  ONSET DATE: June 03, 2024  Rationale for Evaluation and Treatment: Rehabilitation  SUBJECTIVE:   SUBJECTIVE STATEMENT: Patient reports that her brother is inpatient rehab and they are discharging him next week.  Patient reports that she went to the ENT yesterday and her tube is functioning correctly.  Patient reports that her dizziness is still present.  Pt accompanied by: self  PERTINENT HISTORY:  Ataxia, Cerebellar atrophy, macular degeneration, diplopia, right ear eustachian tube dysfunction (scheduled for tympanostomy tube placement), osteopenia  PAIN:  Are you having pain? Yes:  NPRS scale: 2-3/10 Pain location: headache and low back Pain description: ache Aggravating factors: rotation Relieving factors: rest  PRECAUTIONS: Fall  RED FLAGS: None   WEIGHT BEARING RESTRICTIONS: No  FALLS: Has patient fallen in last 6 months? No  LIVING ENVIRONMENT: Lives with: lives alone Lives in: House/apartment Stairs: single level condo in [place] Has following equipment at home: Single point cane and Grab bars  PLOF: Independent and Leisure: walking with friends in community  PATIENT GOALS: To be able to return to walking with her friends in the community and decrease dizziness.  OBJECTIVE:  Note: Objective measures were completed at Evaluation unless otherwise noted.  DIAGNOSTIC FINDINGS:  Brain MRI on 03/15/2023: IMPRESSION: Unremarkable MRI of the head and orbits for patient age.  COGNITION: Overall cognitive status: Within functional limits for tasks assessed    POSTURE:  rounded shoulders and forward head  Cervical ROM:    Active A/ROM (deg) eval A/ROM (Deg) 07/22/24  Flexion 40 53  Extension 35 37  Right lateral flexion 25 25  Left lateral flexion 25 25  Right rotation 38 38  Left rotation 50 60  (Blank rows = not tested)  STRENGTH:  Eval: Bilateral hip strength grossly 3+/5 Bilateral quad strength of 5-/5 Bilateral shoulder strength 4-/5  07/22/2024: Bilateral hip strength of 3+ to 4-/5 grossly throughout Right quad strength is 5/5, left quad is 5-/5 Bilateral shoulder strength 4-/5    GAIT: Gait pattern: ataxic Distance walked: >500 ft Assistive device utilized: Single point cane Level of assistance: Modified independence Comments: slower gait velocity  FUNCTIONAL TESTS:  Eval: 5 times sit to stand: 19.44 sec with UE use Timed up and go (TUG): 19.03 sec with [facility]  06/24/2024: 6 minute walk test:  900 ft (age related norms are 1,393 ft)  07/22/2024: 5 times sit to stand: 12.24 sec with UE use 6 minute walk test:   966 ft   PATIENT SURVEYS:  Eval: Dizziness Handicapped Inventory:  Total Score: 42 / 100  VESTIBULAR ASSESSMENT:    SYMPTOM BEHAVIOR:  Subjective history: Started on June 03, 2024 with severe dizziness  Non-Vestibular symptoms: changes in hearing, diplopia, neck pain, and tinnitus  Type of dizziness: Diplopia and Spinning/Vertigo  Frequency: daily  Duration: constant  Aggravating factors: Induced by position change: rolling to the right and Induced by motion: turning head quickly  Relieving factors: head stationary and rest  Progression of symptoms: unchanged  OCULOMOTOR EXAM:  Ocular Alignment: normal  Ocular ROM: No Limitations  Spontaneous Nystagmus: absent  Gaze-Induced Nystagmus: absent  Smooth Pursuits: intact with horizontal movements, but saccades noted with vertical movements  Saccades: dysmetria     VESTIBULAR - OCULAR REFLEX:     Head-Impulse Test: HIT Right: positive HIT Left: positive     POSITIONAL TESTING: Right Dix-Hallpike: upbeating, right nystagmus Left  Dix-Hallpike: slow moving nystagmus noted    OTHOSTATICS: not done                                                                                                                             TREATMENT DATE:   07/22/2024: Nustep level 5 x5 min with PT present to discuss status 5 times sit to stand: 12.24 sec with UE use 6 minute walk test:  966 ft Dix Hallpike positive on the right, proceeded with Epley Maneuver for right canalith repositioning x1   07/15/2022: Nustep level 4 x5 min with PT present to discuss status Seated with 3# ankle weight:  LAQ, marching, and heel/toe raises.  2x10 each bilat Standing on foam pad performing slow horizontal head rotations with CGA 2x1 min Leg Press (seat at 6) 50# 2x10 Dix Hallpike positive on the right, proceeded with Epley Maneuver for right canalith repositioning x1   07/01/2024: Trenda Craze positive on the right, proceeded with Epley Maneuver  for right canalith repositioning x1 Education about balance and the factors that lead to improved balance and the roll of the vestibular system Seated with 3# ankle weight:  LAQ, marching, and heel/toe raises.  2x10 each bilat Nustep level 4 x4 min with PT present to discuss status Leg Press (seat at 6) 50# 2x10   PATIENT EDUCATION: Education details: Issued HEP Person educated: Patient Education method: Explanation, Demonstration, and Handouts Education comprehension: verbalized understanding and returned demonstration  HOME EXERCISE PROGRAM: Access Code: [facility] URL: [email or web address] Date: 06/24/2024 Prepared by: Jarrell Ahmere Hemenway  Exercises - Seated Cervical Rotation AROM  - 1 x daily - 7 x weekly - 2 sets - 10 reps - Seated Scapular Retraction  - 1 x daily - 7 x weekly - 2 sets - 10 reps - Sit to Stand  - 1 x daily - 7 x weekly - 2 sets - 10 reps - Standing Marching  - 1 x daily - 7 x weekly - 2 sets - 10 reps - Seated Horizontal Smooth Pursuit  - 1 x daily - 7 x weekly - 2 sets - 10 reps - Seated Vertical Smooth Pursuit  - 1 x daily - 7 x weekly - 2 sets - 10 reps - Seated Proximal-Distal Smooth Pursuit  - 1 x daily - 7 x weekly - 2 sets - 10 reps - Brandt-Daroff Vestibular Exercise  - 1 x daily - 7 x weekly - 3-5 reps  GOALS: Goals reviewed with patient? Yes  SHORT TERM GOALS: Target date: 07/10/2024  Patient will be compliant with initial HEP. Baseline: Goal status: MET  2.  Patient will participate in a 6 min walk test to establish a baseline measurement. Baseline:  Goal status: MET on 06/24/24  3.  Patient will report a 30% improvement in dizziness. Baseline:  Goal status: IN PROGRESS   LONG TERM GOALS: Target date: 10/03/2024  Patient will be independent with advanced HEP to allow for self progression after discharge.  Baseline:  Goal status: IN PROGRESS  2.  Patient will improve Dizziness Handicapped Inventory to no greater than 15% to  demonstrate improvement in functional tasks. Baseline: 42/100 Goal status: IN PROGRESS  3.  Patient will report ability to walk with her community friends for >15 minutes with keeping up pace and 50% less fatigue. Baseline: reports slower gait and increased dyspnea Goal status: IN PROGRESS  4.  Patient will increase her UE strength to [facility] to allow her to pick up her cat and place it in the carrier. Baseline:  Goal status: IN PROGRESS  5.  Patient will increase LE strength to [facility] to allow her to return to gym at [facility] and perform weight machines. Baseline:  Goal status: IN PROGRESS  6.  Patient will increase cervical ROM to [facility] to allow her to look both directions to more easily cross the road and less pain with driving. Baseline:  Goal status: IN PROGRESS (see above)  ASSESSMENT:  CLINICAL IMPRESSION:  Ms Sieh presents to skilled PT reporting that has to cancel her visit next week.  She reports that she went to Dr Karis and he wanted her to continue PT to continue to progress her vestibular rehab and strengthening.  Patient additionally educated on the importance on improving compliance with HEP.  Patient continues to have increased stressors in her life secondary to her brother being in inpatient rehab and potentially discharging home next week.  Patient with improvement noted in 6 minute walk and 5 times sit to stand.  Patient continues to have dizziness and educated patient on impact of stressors on her physical health.  Patient has not yet met her goals due to having limited visits due to her brother's health.  Recommend patient continue PT 1-2x/week for 8 weeks to progress towards goal related activities.  OBJECTIVE IMPAIRMENTS: Abnormal gait, decreased balance, difficulty walking, decreased ROM, decreased strength, dizziness, impaired perceived functional ability, increased muscle spasms, impaired flexibility, postural dysfunction, and pain.   ACTIVITY LIMITATIONS: bending,  standing, squatting, bed mobility, and locomotion level  PARTICIPATION LIMITATIONS: cleaning, laundry, driving, and community activity  PERSONAL FACTORS: Past/current experiences, Time since onset of injury/illness/exacerbation, and 3+ comorbidities: Osteopenia, Sensorinerual hearing loss, macular degeneration are also affecting patient's functional outcome.   REHAB POTENTIAL: Good  CLINICAL DECISION MAKING: Evolving/moderate complexity  EVALUATION COMPLEXITY: Moderate   PLAN:  PT FREQUENCY: 1-2x/week  PT DURATION: 8 weeks  PLANNED INTERVENTIONS: 97164- PT Re-evaluation, 97750- Physical Performance Testing, 97110-Therapeutic exercises, 97530- Therapeutic activity, 97112- Neuromuscular re-education, 97535- Self Care, [id number]- Manual therapy, [phone]- Gait training, [phone]- Canalith repositioning, [id number]- Aquatic Therapy, [phone]- Electrical stimulation (unattended), [phone]- Electrical stimulation (manual), [id number]- Vasopneumatic device, [id number]- Ultrasound, [id number]- Traction (mechanical), [id number]- Ionotophoresis 4mg /ml Dexamethasone, 79439 (1-2 muscles), [id number] (3+ muscles)- Dry Needling, Patient/Family education, Balance training, Stair training, Taping, Joint mobilization, Joint manipulation, Spinal manipulation, Spinal mobilization, Vestibular training, Cryotherapy, and Moist heat  PLAN FOR NEXT SESSION: Assess and progress HEP as indicated, strengthening, flexibility, vestibular rehab   Jarrell Laming, PT, DPT 07/22/24, 1:52 PM  [facility] [address] [place], [place] [address] Phone # [phone] Fax [phone]

## 2024-07-29 ENCOUNTER — Ambulatory Visit: Admitting: Rehabilitative and Restorative Service Providers"

## 2024-08-04 ENCOUNTER — Ambulatory Visit (INDEPENDENT_AMBULATORY_CARE_PROVIDER_SITE_OTHER): Admitting: Audiology

## 2024-08-05 ENCOUNTER — Ambulatory Visit: Attending: Otolaryngology | Admitting: Rehabilitative and Restorative Service Providers"

## 2024-08-05 ENCOUNTER — Encounter: Payer: Self-pay | Admitting: Rehabilitative and Restorative Service Providers"

## 2024-08-05 DIAGNOSIS — R2689 Other abnormalities of gait and mobility: Secondary | ICD-10-CM | POA: Insufficient documentation

## 2024-08-05 DIAGNOSIS — R42 Dizziness and giddiness: Secondary | ICD-10-CM | POA: Diagnosis present

## 2024-08-05 DIAGNOSIS — H8113 Benign paroxysmal vertigo, bilateral: Secondary | ICD-10-CM | POA: Diagnosis present

## 2024-08-05 DIAGNOSIS — M6281 Muscle weakness (generalized): Secondary | ICD-10-CM | POA: Insufficient documentation

## 2024-08-05 DIAGNOSIS — R262 Difficulty in walking, not elsewhere classified: Secondary | ICD-10-CM | POA: Insufficient documentation

## 2024-08-05 NOTE — Therapy (Signed)
 OUTPATIENT PHYSICAL THERAPY VESTIBULAR TREATMENT NOTE  AND REASSESSMENT NOTE     Patient Name: Mary Malone MRN: [id number] DOB:12-24-1945, 78 y.o., female Today's Date: 08/05/2024  END OF SESSION:  PT End of Session - 08/05/24 1145     Visit Number 6    Date for Recertification  10/02/24    Authorization Type [employer] Medicare ([employer] approved 06/16/2024 - 08/12/2024)    Authorization - Visit Number 6    Authorization - Number of Visits 16    Progress Note Due on Visit 15    PT Start Time 1145    PT Stop Time 1225    PT Time Calculation (min) 40 min    Activity Tolerance Patient tolerated treatment well    Behavior During Therapy [facility] 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   Osteoporosis    PMB (postmenopausal bleeding) 12/2001   polyps   Serum calcium elevated 2021   sees Dr.Ellison/pulmonology with [facility]   Thyroid  disease    hypothyroidism   URINARY INCONTINENCE    OAB   Wisdom teeth removed 1866/1967   Past Surgical History:  Procedure Laterality Date   BREAST CYST ASPIRATION  12/05/2001   CATARACT EXTRACTION Bilateral 07/2012, 09/2012   DILATION AND CURETTAGE OF UTERUS  2003   EYE SURGERY  2014   Cataracts   HYSTEROSCOPY  01/2002   resection, sm fibroid   Left knee surgey     MENISCUS REPAIR     left knee 2010   REFRACTIVE SURGERY  02/2016   TYMPANOSTOMY TUBE PLACEMENT Right 06/18/2024   WISDOM TOOTH EXTRACTION     Patient Active Problem List   Diagnosis Date Noted   Dizziness 06/15/2024   Right chronic serous otitis media  06/15/2024   Impacted cerumen of right ear 06/15/2024   Double vision 12/13/2023   Sensorineural hearing loss, 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   Chronic rhinitis 06/10/2023   Other specified disorders of eustachian tube, right ear 06/10/2023   Thyroid  nodule 09/14/2022   Dyspnea on exertion 04/27/2022   Cervical radiculopathy 02/03/2021   Basal cell carcinoma (BCC) of lateral side wall of nose 11/09/2020   Hypercalcemia 04/20/2020   Numbness 04/19/2020   Left leg pain 07/08/2018   Sacral back pain 04/16/2018   Osteoporosis 08/09/2016   White matter abnormality on MRI of brain 07/11/2015   MCI (mild cognitive impairment) 07/11/2015   Routine general medical examination at a health care facility 02/11/2015   Anxiety and depression 11/01/2010   OAB (overactive bladder)    GERD 09/23/2007    PCP: Rollene Almarie LABOR, MD REFERRING PROVIDER: Karis Clunes, MD  REFERRING DIAG: R42 (ICD-10-CM) - Dizziness  THERAPY DIAG:  Dizziness and giddiness  Other abnormalities of gait and mobility  Muscle weakness (generalized)  Difficulty in walking, not elsewhere classified  BPPV (benign paroxysmal positional vertigo), bilateral  ONSET DATE: June 03, 2024  Rationale for Evaluation and Treatment:  Rehabilitation  SUBJECTIVE:   SUBJECTIVE STATEMENT: Patient reports that she has been having increased dizziness.  States that her brother is out of rehab now.  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  Pt accompanied by: self  PERTINENT HISTORY:  Ataxia, Cerebellar atrophy, macular degeneration, diplopia, right ear eustachian tube dysfunction (scheduled for tympanostomy tube placement), osteopenia  PAIN:  Are you having pain? Yes: NPRS scale: 3/10 Pain location: headache and low back Pain description: ache Aggravating factors: rotation Relieving factors: rest  PRECAUTIONS: Fall  RED FLAGS: None   WEIGHT BEARING RESTRICTIONS: No  FALLS: Has patient fallen in  last 6 months? No  LIVING ENVIRONMENT: Lives with: lives alone Lives in: House/apartment Stairs: single level condo in [place] Has following equipment at home: Single point cane and Grab bars  PLOF: Independent and Leisure: walking with friends in community  PATIENT GOALS: To be able to return to walking with her friends in the community and decrease dizziness.  OBJECTIVE:  Note: Objective measures were completed at Evaluation unless otherwise noted.  DIAGNOSTIC FINDINGS:  Brain MRI on 03/15/2023: IMPRESSION: Unremarkable MRI of the head and orbits for patient age.  COGNITION: Overall cognitive status: Within functional limits for tasks assessed    POSTURE:  rounded shoulders and forward head  Cervical ROM:    Active A/ROM (deg) eval A/ROM (Deg) 07/22/24  Flexion 40 53  Extension 35 37  Right lateral flexion 25 25  Left lateral flexion 25 25  Right rotation 38 38  Left rotation 50 60  (Blank rows = not tested)  STRENGTH:  Eval: Bilateral hip strength grossly 3+/5 Bilateral quad strength of 5-/5 Bilateral shoulder strength 4-/5  07/22/2024: Bilateral hip strength of 3+ to 4-/5 grossly throughout Right quad strength is 5/5, left quad is 5-/5 Bilateral shoulder strength 4-/5    GAIT: Gait pattern: ataxic Distance walked: >500 ft Assistive device utilized: Single point cane Level of assistance: Modified independence Comments: slower gait velocity  FUNCTIONAL TESTS:  Eval: 5 times sit to stand: 19.44 sec with UE use Timed up and go (TUG): 19.03 sec with [facility]  06/24/2024: 6 minute walk test:  900 ft (age related norms are 1,393 ft)  07/22/2024: 5 times sit to stand: 12.24 sec with UE use 6 minute walk test:  966 ft   PATIENT SURVEYS:  Eval: Dizziness Handicapped Inventory:  Total Score: 42 / 100  08/05/2024:  DHI Total Score: 36 / 100  VESTIBULAR ASSESSMENT:    SYMPTOM BEHAVIOR:  Subjective history: Started on June 03, 2024 with severe  dizziness  Non-Vestibular symptoms: changes in hearing, diplopia, neck pain, and tinnitus  Type of dizziness: Diplopia and Spinning/Vertigo  Frequency: daily  Duration: constant  Aggravating factors: Induced by position change: rolling to the right and Induced by motion: turning head quickly  Relieving factors: head stationary and rest  Progression of symptoms: unchanged  OCULOMOTOR EXAM:  Ocular Alignment: normal  Ocular ROM: No Limitations  Spontaneous Nystagmus: absent  Gaze-Induced Nystagmus: absent  Smooth Pursuits: intact with horizontal movements, but saccades noted with vertical movements  Saccades: dysmetria     VESTIBULAR - OCULAR REFLEX:     Head-Impulse Test: HIT Right: positive HIT Left: positive     POSITIONAL TESTING: Right Dix-Hallpike: upbeating, right nystagmus Left Dix-Hallpike: slow moving nystagmus noted    OTHOSTATICS: not done  TREATMENT DATE:   08/05/2024: Nustep level 5 x5 min with PT present to discuss status Seated with 3# ankle weight:  LAQ, marching, and heel/toe raises.  2x10 each bilat O2 sats 95%, HR 97 bpm, BP 152/74 Leg Press (seat at 6) 50# 5x10 Dix Hallpike positive on the right, proceeded with Epley Maneuver for right canalith repositioning x1   07/22/2024: Nustep level 5 x5 min with PT present to discuss status 5 times sit to stand: 12.24 sec with UE use 6 minute walk test:  966 ft Dix Hallpike positive on the right, proceeded with Epley Maneuver for right canalith repositioning x1   07/15/2022: Nustep level 4 x5 min with PT present to discuss status Seated with 3# ankle weight:  LAQ, marching, and heel/toe raises.  2x10 each bilat Standing on foam pad performing slow horizontal head rotations with CGA 2x1 min Leg Press (seat at 6) 50# 2x10 Dix Hallpike positive on the right, proceeded with Epley Maneuver  for right canalith repositioning x1    PATIENT EDUCATION: Education details: Issued HEP Person educated: Patient Education method: Explanation, Demonstration, and Handouts Education comprehension: verbalized understanding and returned demonstration  HOME EXERCISE PROGRAM: Access Code: [facility] URL: [email or web address] Date: 06/24/2024 Prepared by: Jarrell Kameshia Madruga  Exercises - Seated Cervical Rotation AROM  - 1 x daily - 7 x weekly - 2 sets - 10 reps - Seated Scapular Retraction  - 1 x daily - 7 x weekly - 2 sets - 10 reps - Sit to Stand  - 1 x daily - 7 x weekly - 2 sets - 10 reps - Standing Marching  - 1 x daily - 7 x weekly - 2 sets - 10 reps - Seated Horizontal Smooth Pursuit  - 1 x daily - 7 x weekly - 2 sets - 10 reps - Seated Vertical Smooth Pursuit  - 1 x daily - 7 x weekly - 2 sets - 10 reps - Seated Proximal-Distal Smooth Pursuit  - 1 x daily - 7 x weekly - 2 sets - 10 reps - Brandt-Daroff Vestibular Exercise  - 1 x daily - 7 x weekly - 3-5 reps  GOALS: Goals reviewed with patient? Yes  SHORT TERM GOALS: Target date: 07/10/2024  Patient will be compliant with initial HEP. Baseline: Goal status: MET  2.  Patient will participate in a 6 min walk test to establish a baseline measurement. Baseline:  Goal status: MET on 06/24/24  3.  Patient will report a 30% improvement in dizziness. Baseline:  Goal status: IN PROGRESS   LONG TERM GOALS: Target date: 10/03/2024  Patient will be independent with advanced HEP to allow for self progression after discharge. Baseline:  Goal status: IN PROGRESS  2.  Patient will improve Dizziness Handicapped Inventory to no greater than 15% to demonstrate improvement in functional tasks. Baseline: 42/100 Goal status: IN PROGRESS  3.  Patient will report ability to walk with her community friends for >15 minutes with keeping up pace and 50% less fatigue. Baseline: reports slower gait and increased dyspnea Goal status:  IN PROGRESS  4.  Patient will increase her UE strength to [facility] to allow her to pick up her cat and place it in the carrier. Baseline:  Goal status: IN PROGRESS  5.  Patient will increase LE strength to [facility] to allow her to return to gym at [facility] and perform weight machines. Baseline:  Goal status: IN PROGRESS  6.  Patient will increase cervical ROM to [facility] to allow her to look  both directions to more easily cross the road and less pain with driving. Baseline:  Goal status: IN PROGRESS (see above)  ASSESSMENT:  CLINICAL IMPRESSION:  Ms Beckstrand presents to skilled PT reporting that her dizziness feels better, especially after the canalith repositioning.  Patient with some improvement noted on Dizziness Handicapped Inventory today.  Patient educated on the importance of performing her HEP at home.  Patient with less nystagmus noted on [facility] and less time in each position of canalith repositioning required during the Epley Maneuver.  Patient continues to require skilled PT to progress towards goal related activities.  OBJECTIVE IMPAIRMENTS: Abnormal gait, decreased balance, difficulty walking, decreased ROM, decreased strength, dizziness, impaired perceived functional ability, increased muscle spasms, impaired flexibility, postural dysfunction, and pain.   ACTIVITY LIMITATIONS: bending, standing, squatting, bed mobility, and locomotion level  PARTICIPATION LIMITATIONS: cleaning, laundry, driving, and community activity  PERSONAL FACTORS: Past/current experiences, Time since onset of injury/illness/exacerbation, and 3+ comorbidities: Osteopenia, Sensorinerual hearing loss, macular degeneration are also affecting patient's functional outcome.   REHAB POTENTIAL: Good  CLINICAL DECISION MAKING: Evolving/moderate complexity  EVALUATION COMPLEXITY: Moderate   PLAN:  PT FREQUENCY: 1-2x/week  PT DURATION: 8 weeks  PLANNED INTERVENTIONS: 97164- PT Re-evaluation, 97750- Physical  Performance Testing, 97110-Therapeutic exercises, 97530- Therapeutic activity, 97112- Neuromuscular re-education, 97535- Self Care, 02859- Manual therapy, [phone]- Gait training, [phone]- Canalith repositioning, [id number]- Aquatic Therapy, [phone]- Electrical stimulation (unattended), [phone]- Electrical stimulation (manual), [id number]- Vasopneumatic device, [id number]- Ultrasound, [id number]- Traction (mechanical), [id number]- Ionotophoresis 4mg /ml Dexamethasone, 79439 (1-2 muscles), 20561 (3+ muscles)- Dry Needling, Patient/Family education, Balance training, Stair training, Taping, Joint mobilization, Joint manipulation, Spinal manipulation, Spinal mobilization, Vestibular training, Cryotherapy, and Moist heat  PLAN FOR NEXT SESSION: Assess and progress HEP as indicated, strengthening, flexibility, vestibular rehab   Jarrell Laming, PT, DPT 08/05/24, 1:21 PM  [facility] [address], [address] [place], [place] [address] Phone # [phone] Fax [phone]

## 2024-08-07 ENCOUNTER — Ambulatory Visit
Admission: RE | Admit: 2024-08-07 | Discharge: 2024-08-07 | Disposition: A | Source: Ambulatory Visit | Attending: Obstetrics and Gynecology | Admitting: Obstetrics and Gynecology

## 2024-08-07 ENCOUNTER — Ambulatory Visit: Admitting: Nurse Practitioner

## 2024-08-07 ENCOUNTER — Encounter: Payer: Self-pay | Admitting: Nurse Practitioner

## 2024-08-07 ENCOUNTER — Encounter: Payer: Self-pay | Admitting: Obstetrics and Gynecology

## 2024-08-07 VITALS — BP 122/70 | HR 80 | Temp 98.5°F

## 2024-08-07 DIAGNOSIS — R350 Frequency of micturition: Secondary | ICD-10-CM | POA: Diagnosis not present

## 2024-08-07 DIAGNOSIS — R32 Unspecified urinary incontinence: Secondary | ICD-10-CM | POA: Diagnosis not present

## 2024-08-07 DIAGNOSIS — Z1231 Encounter for screening mammogram for malignant neoplasm of breast: Secondary | ICD-10-CM

## 2024-08-07 NOTE — Telephone Encounter (Signed)
 Can do 1:45

## 2024-08-07 NOTE — Telephone Encounter (Signed)
 Spoke with patient, OV scheduled for today at 1345.   Encounter closed.

## 2024-08-07 NOTE — Telephone Encounter (Signed)
 Last B&P exam 08/12/23 -BS  Mary Malone -please review, ok to add to schedule this afternoon?

## 2024-08-07 NOTE — Progress Notes (Signed)
   Acute Office Visit  Subjective:    Patient ID: Mary Malone, female    DOB: November 15, 1945, 78 y.o.   MRN: [id number]   HPI 78 y.o. presents today for urinary frequency for about a week. Started last week while her brother was hospitalized. She was unable to go to the restroom often. Had an episode of incontinence while shopping yesterday. This has never happened to her and was a full emptying of bladder with no warning signs. Using premarin  for vaginal atrophy twice weekly. 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 Has not seen neurologist to discuss these results and management yet.   Patient's last menstrual period was 09/03/1996.    Review of Systems  Constitutional: Negative.   Genitourinary:  Positive for frequency. Negative for dysuria, flank pain, hematuria, urgency, vaginal discharge and vaginal pain.       Episode of incontinence x 1       Objective:    Physical Exam Constitutional:      Appearance: Normal appearance.     BP 122/70   Pulse 80   Temp 98.5 F (36.9 C) (Oral)   LMP 09/03/1996   SpO2 99%  Wt Readings from Last 3 Encounters:  07/21/24 135 lb (61.2 kg)  06/15/24 135 lb (61.2 kg)  02/17/24 134 lb (60.8 kg)        UA: 1+ leukocytes, neg nitrites, trace blood, yellow/clear. Microscopic: wbc 6-10, rbc 0-2, few bacteria  Assessment & Plan:   Problem List Items Addressed This Visit   None Visit Diagnoses       Urinary frequency    -  Primary   Relevant Orders   Urinalysis,Complete w/RFL Culture (Completed)     Urinary incontinence, unspecified type          Plan: UA unremarkable. Culture pending. Patient plans to schedule appt with neurologist to discuss management options for ataxia.   Return if symptoms worsen or fail to improve.    Mary DELENA Shutter DNP, 2:25 PM 08/07/2024

## 2024-08-09 ENCOUNTER — Ambulatory Visit: Payer: Self-pay | Admitting: Nurse Practitioner

## 2024-08-09 LAB — URINALYSIS, COMPLETE W/RFL CULTURE
Bilirubin Urine: NEGATIVE
Glucose, UA: NEGATIVE
Hyaline Cast: NONE SEEN /LPF
Ketones, ur: NEGATIVE
Nitrites, Initial: NEGATIVE
Protein, ur: NEGATIVE
Specific Gravity, Urine: 1.015 (ref 1.001–1.035)
pH: 6 (ref 5.0–8.0)

## 2024-08-09 LAB — URINE CULTURE
MICRO NUMBER:: 17320809
SPECIMEN QUALITY:: ADEQUATE

## 2024-08-09 LAB — CULTURE INDICATED

## 2024-08-12 ENCOUNTER — Encounter: Payer: Self-pay | Admitting: Rehabilitative and Restorative Service Providers"

## 2024-08-12 ENCOUNTER — Ambulatory Visit: Admitting: Rehabilitative and Restorative Service Providers"

## 2024-08-12 ENCOUNTER — Other Ambulatory Visit (HOSPITAL_BASED_OUTPATIENT_CLINIC_OR_DEPARTMENT_OTHER): Payer: Self-pay

## 2024-08-12 DIAGNOSIS — M6281 Muscle weakness (generalized): Secondary | ICD-10-CM

## 2024-08-12 DIAGNOSIS — R2689 Other abnormalities of gait and mobility: Secondary | ICD-10-CM

## 2024-08-12 DIAGNOSIS — R42 Dizziness and giddiness: Secondary | ICD-10-CM

## 2024-08-12 DIAGNOSIS — R262 Difficulty in walking, not elsewhere classified: Secondary | ICD-10-CM

## 2024-08-12 DIAGNOSIS — H8113 Benign paroxysmal vertigo, bilateral: Secondary | ICD-10-CM

## 2024-08-12 NOTE — Therapy (Signed)
 OUTPATIENT PHYSICAL THERAPY VESTIBULAR TREATMENT NOTE  AND REASSESSMENT NOTE     Patient Name: Mary Malone MRN: [id number] DOB:10-05-1945, 78 y.o., female Today's Date: 08/12/2024  END OF SESSION:  PT End of Session - 08/12/24 0850     Visit Number 7    Date for Recertification  10/02/24    Authorization Type [employer] Medicare ([employer] approved 06/16/2024 - 08/12/2024) shara submitted    Authorization - Visit Number 7    Authorization - Number of Visits 16    Progress Note Due on Visit 15    PT Start Time 0847    PT Stop Time 0925    PT Time Calculation (min) 38 min    Activity Tolerance Patient tolerated treatment well    Behavior During Therapy [facility] for tasks assessed/performed          Past Medical History:  Diagnosis Date   ALLERGIC RHINITIS    Allergy  05/04/1958   Plants, animals   Ataxia    Back pain    Cancer (HCC) 12/02/2020   basal cell of nose   Cataract 2014   Surgery both eyes   Dense breast    DEPRESSION    Fibroid    GERD    Hearing loss    History of radiation therapy 12/02/2020   for basal cell carcinoma of nose   HYPERLIPIDEMIA    diet control efforts   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   Osteoporosis 2018   PMB (postmenopausal bleeding) 12/2001   polyps   Serum calcium elevated 2021   sees Dr.Ellison/pulmonology with [facility]   Thyroid  disease    hypothyroidism   URINARY INCONTINENCE    OAB   Wisdom teeth removed 1866/1967   Past Surgical History:  Procedure Laterality Date   BREAST CYST ASPIRATION  12/05/2001   CATARACT EXTRACTION Bilateral 07/2012, 09/2012   DILATION AND CURETTAGE OF UTERUS  2003   EYE SURGERY  2014   Cataracts   HYSTEROSCOPY  01/2002   resection, sm fibroid   Left knee surgey     MENISCUS REPAIR     left knee 2010   REFRACTIVE SURGERY  02/2016   TYMPANOSTOMY TUBE PLACEMENT Right 06/18/2024   WISDOM TOOTH EXTRACTION     Patient Active Problem List   Diagnosis Date Noted   Dizziness 06/15/2024   Right  chronic serous otitis media 06/15/2024   Impacted cerumen of right ear 06/15/2024   Double vision 12/13/2023   Sensorineural hearing loss, 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   Chronic rhinitis 06/10/2023   Other specified disorders of eustachian tube, right ear 06/10/2023   Thyroid  nodule 09/14/2022   Dyspnea on exertion 04/27/2022   Cervical radiculopathy 02/03/2021   Basal cell carcinoma (BCC) of lateral side wall of nose 11/09/2020   Hypercalcemia 04/20/2020   Numbness 04/19/2020   Left leg pain 07/08/2018   Sacral back pain 04/16/2018   Osteoporosis 08/09/2016   White matter abnormality on MRI of brain 07/11/2015   MCI (mild cognitive impairment) 07/11/2015   Routine general medical examination at a health care facility 02/11/2015   Anxiety and depression 11/01/2010   OAB (overactive bladder)    GERD 09/23/2007    PCP: Rollene Almarie LABOR, MD REFERRING PROVIDER: Karis Clunes, MD  REFERRING DIAG: R42 (ICD-10-CM) - Dizziness  THERAPY DIAG:  Dizziness and giddiness  Other abnormalities of gait and mobility  Muscle weakness (generalized)  Difficulty in walking, not elsewhere classified  BPPV (benign paroxysmal positional vertigo), bilateral  ONSET DATE: June 03, 2024  Rationale for Evaluation and Treatment: Rehabilitation  SUBJECTIVE:   SUBJECTIVE STATEMENT: Patient reports that overall, her dizziness does feel less, however, she states that she did lose her balance this morning.  She reports that she caught herself and did not fall.  Pt accompanied by: self  PERTINENT HISTORY:  Ataxia, Cerebellar atrophy, macular degeneration, diplopia, right ear eustachian tube dysfunction (scheduled for tympanostomy tube placement), osteopenia  PAIN:  Are you having pain? Yes: NPRS scale: 2/10 Pain location: headache and mid/low back Pain description: ache Aggravating factors: rotation Relieving factors: rest  PRECAUTIONS: Fall  RED FLAGS: None   WEIGHT BEARING RESTRICTIONS:  No  FALLS: Has patient fallen in last 6 months? No  LIVING ENVIRONMENT: Lives with: lives alone Lives in: House/apartment Stairs: single level condo in [place] Has following equipment at home: Single point cane and Grab bars  PLOF: Independent and Leisure: walking with friends in community  PATIENT GOALS: To be able to return to walking with her friends in the community and decrease dizziness.  OBJECTIVE:  Note: Objective measures were completed at Evaluation unless otherwise noted.  DIAGNOSTIC FINDINGS:  Brain MRI on 03/15/2023: IMPRESSION: Unremarkable MRI of the head and orbits for patient age.  COGNITION: Overall cognitive status: Within functional limits for tasks assessed    POSTURE:  rounded shoulders and forward head  Cervical ROM:    Active A/ROM (deg) eval A/ROM (Deg) 07/22/24  Flexion 40 53  Extension 35 37  Right lateral flexion 25 25  Left lateral flexion 25 25  Right rotation 38 38  Left rotation 50 60  (Blank rows = not tested)  STRENGTH:  Eval: Bilateral hip strength grossly 3+/5 Bilateral quad strength of 5-/5 Bilateral shoulder strength 4-/5  07/22/2024: Bilateral hip strength of 3+ to 4-/5 grossly throughout Right quad strength is 5/5, left quad is 5-/5 Bilateral shoulder strength 4-/5    GAIT: Gait pattern: ataxic Distance walked: >500 ft Assistive device utilized: Single point cane Level of assistance: Modified independence Comments: slower gait velocity  FUNCTIONAL TESTS:  Eval: 5 times sit to stand: 19.44 sec with UE use Timed up and go (TUG): 19.03 sec with [facility]  06/24/2024: 6 minute walk test:  900 ft (age related norms are 1,393 ft)  07/22/2024: 5 times sit to stand: 12.24 sec with UE use 6 minute walk test:  966 ft   PATIENT SURVEYS:  Eval: Dizziness Handicapped Inventory:  Total Score: 42 / 100  08/05/2024:  DHI Total Score: 36 / 100  VESTIBULAR ASSESSMENT:    SYMPTOM BEHAVIOR:  Subjective history:  Started on June 03, 2024 with severe dizziness  Non-Vestibular symptoms: changes in hearing, diplopia, neck pain, and tinnitus  Type of dizziness: Diplopia and Spinning/Vertigo  Frequency: daily  Duration: constant  Aggravating factors: Induced by position change: rolling to the right and Induced by motion: turning head quickly  Relieving factors: head stationary and rest  Progression of symptoms: unchanged  OCULOMOTOR EXAM:  Ocular Alignment: normal  Ocular ROM: No Limitations  Spontaneous Nystagmus: absent  Gaze-Induced Nystagmus: absent  Smooth Pursuits: intact with horizontal movements, but saccades noted with vertical movements  Saccades: dysmetria     VESTIBULAR - OCULAR REFLEX:     Head-Impulse Test: HIT Right: positive HIT Left: positive     POSITIONAL TESTING: Right Dix-Hallpike: upbeating, right nystagmus Left Dix-Hallpike: slow moving nystagmus noted    OTHOSTATICS: not done  TREATMENT DATE:   08/12/2024: Nustep level 5 x6 min with PT present to discuss status Seated with 3# ankle weight:  LAQ, marching, and heel/toe raises.  2x10 each bilat Leg Press (seat at 6) 60# 2x10 Standing on foam pad performing slow horizontal head rotations with CGA 2x1 min Standing on foam pad performing hip abduction x10 bilat Dix Hallpike positive on the right, proceeded with Epley Maneuver for right canalith repositioning x1   08/05/2024: Nustep level 5 x5 min with PT present to discuss status Seated with 3# ankle weight:  LAQ, marching, and heel/toe raises.  2x10 each bilat O2 sats 95%, HR 97 bpm, BP 152/74 Leg Press (seat at 6) 50# 5x10 Dix Hallpike positive on the right, proceeded with Epley Maneuver for right canalith repositioning x1   07/22/2024: Nustep level 5 x5 min with PT present to discuss status 5 times sit to stand: 12.24 sec with UE  use 6 minute walk test:  966 ft Dix Hallpike positive on the right, proceeded with Epley Maneuver for right canalith repositioning x1    PATIENT EDUCATION: Education details: Issued HEP Person educated: Patient Education method: Explanation, Demonstration, and Handouts Education comprehension: verbalized understanding and returned demonstration  HOME EXERCISE PROGRAM: Access Code: [facility] URL: [email or web address] Date: 06/24/2024 Prepared by: Jarrell Shylin Keizer  Exercises - Seated Cervical Rotation AROM  - 1 x daily - 7 x weekly - 2 sets - 10 reps - Seated Scapular Retraction  - 1 x daily - 7 x weekly - 2 sets - 10 reps - Sit to Stand  - 1 x daily - 7 x weekly - 2 sets - 10 reps - Standing Marching  - 1 x daily - 7 x weekly - 2 sets - 10 reps - Seated Horizontal Smooth Pursuit  - 1 x daily - 7 x weekly - 2 sets - 10 reps - Seated Vertical Smooth Pursuit  - 1 x daily - 7 x weekly - 2 sets - 10 reps - Seated Proximal-Distal Smooth Pursuit  - 1 x daily - 7 x weekly - 2 sets - 10 reps - Brandt-Daroff Vestibular Exercise  - 1 x daily - 7 x weekly - 3-5 reps  GOALS: Goals reviewed with patient? Yes  SHORT TERM GOALS: Target date: 07/10/2024  Patient will be compliant with initial HEP. Baseline: Goal status: MET  2.  Patient will participate in a 6 min walk test to establish a baseline measurement. Baseline:  Goal status: MET on 06/24/24  3.  Patient will report a 30% improvement in dizziness. Baseline:  Goal status: IN PROGRESS   LONG TERM GOALS: Target date: 10/03/2024  Patient will be independent with advanced HEP to allow for self progression after discharge. Baseline:  Goal status: IN PROGRESS  2.  Patient will improve Dizziness Handicapped Inventory to no greater than 15% to demonstrate improvement in functional tasks. Baseline: 42/100 Goal status: IN PROGRESS  3.  Patient will report ability to walk with her community friends for >15 minutes with  keeping up pace and 50% less fatigue. Baseline: reports slower gait and increased dyspnea Goal status: IN PROGRESS  4.  Patient will increase her UE strength to [facility] to allow her to pick up her cat and place it in the carrier. Baseline:  Goal status: IN PROGRESS  5.  Patient will increase LE strength to [facility] to allow her to return to gym at [facility] and perform weight machines. Baseline:  Goal status: IN PROGRESS  6.  Patient will increase  cervical ROM to [facility] to allow her to look both directions to more easily cross the road and less pain with driving. Baseline:  Goal status: IN PROGRESS (see above)  ASSESSMENT:  CLINICAL IMPRESSION:  Mary Malone presents to skilled PT reporting that her dizziness seemed better for at least 4 days, which is longer than in the past.  Patient does admit to losing her balance this morning (caught herself, so she did not fall), but states that she was in a hurry.  Patient was able to progress with the weight on the leg press this morning.  Patient continues to have dizziness with horizontal head turns on foam mat.  Patient with positive Trenda Craze on the right and proceeded with canalith repositioning.  Patient continues to require skilled PT to progress towards goal related activities.  OBJECTIVE IMPAIRMENTS: Abnormal gait, decreased balance, difficulty walking, decreased ROM, decreased strength, dizziness, impaired perceived functional ability, increased muscle spasms, impaired flexibility, postural dysfunction, and pain.   ACTIVITY LIMITATIONS: bending, standing, squatting, bed mobility, and locomotion level  PARTICIPATION LIMITATIONS: cleaning, laundry, driving, and community activity  PERSONAL FACTORS: Past/current experiences, Time since onset of injury/illness/exacerbation, and 3+ comorbidities: Osteopenia, Sensorinerual hearing loss, macular degeneration are also affecting patient's functional outcome.   REHAB POTENTIAL: Good  CLINICAL DECISION  MAKING: Evolving/moderate complexity  EVALUATION COMPLEXITY: Moderate   PLAN:  PT FREQUENCY: 1-2x/week  PT DURATION: 8 weeks  PLANNED INTERVENTIONS: 97164- PT Re-evaluation, 97750- Physical Performance Testing, 97110-Therapeutic exercises, 97530- Therapeutic activity, 97112- Neuromuscular re-education, 97535- Self Care, 02859- Manual therapy, [phone]- Gait training, [phone]- Canalith repositioning, [id number]- Aquatic Therapy, [phone]- Electrical stimulation (unattended), [phone]- Electrical stimulation (manual), [id number]- Vasopneumatic device, [id number]- Ultrasound, [id number]- Traction (mechanical), [id number]- Ionotophoresis 4mg /ml Dexamethasone, 79439 (1-2 muscles), 20561 (3+ muscles)- Dry Needling, Patient/Family education, Balance training, Stair training, Taping, Joint mobilization, Joint manipulation, Spinal manipulation, Spinal mobilization, Vestibular training, Cryotherapy, and Moist heat  PLAN FOR NEXT SESSION: Assess and progress HEP as indicated, strengthening, flexibility, vestibular rehab   Jarrell Laming, PT, DPT 08/12/24, 9:32 AM  [address] [facility] [address] [place], [place] [address] Phone # [phone] Fax [phone]

## 2024-08-13 ENCOUNTER — Encounter: Payer: Self-pay | Admitting: Obstetrics and Gynecology

## 2024-08-13 ENCOUNTER — Other Ambulatory Visit (HOSPITAL_BASED_OUTPATIENT_CLINIC_OR_DEPARTMENT_OTHER): Payer: Self-pay

## 2024-08-13 ENCOUNTER — Ambulatory Visit: Payer: Medicare Other | Admitting: Obstetrics and Gynecology

## 2024-08-13 VITALS — BP 116/74 | HR 74 | Ht 65.5 in | Wt 133.0 lb

## 2024-08-13 DIAGNOSIS — Z5181 Encounter for therapeutic drug level monitoring: Secondary | ICD-10-CM | POA: Diagnosis not present

## 2024-08-13 DIAGNOSIS — N952 Postmenopausal atrophic vaginitis: Secondary | ICD-10-CM

## 2024-08-13 DIAGNOSIS — N3281 Overactive bladder: Secondary | ICD-10-CM

## 2024-08-13 MED ORDER — OXYBUTYNIN CHLORIDE ER 5 MG PO TB24: ORAL_TABLET | ORAL | 1 refills | Status: DC

## 2024-08-13 MED ORDER — PREMARIN 0.625 MG/GM VA CREA: TOPICAL_CREAM | VAGINAL | 2 refills | Status: AC

## 2024-08-13 NOTE — Patient Instructions (Addendum)
Oxybutynin Extended-Release Tablets What is this medication? OXYBUTYNIN (ox i BYOO ti nin) treats symptoms of an overactive bladder, such as loss of bladder control or frequent need to urinate. It works by relaxing muscles in the bladder. It belongs to a group of medications called antispasmodics. This medicine may be used for other purposes; ask your health care provider or pharmacist if you have questions. COMMON BRAND NAME(S): Ditropan XL What should I tell my care team before I take this medication? They need to know if you have any of these conditions: Autonomic neuropathy Dementia Glaucoma Intestinal obstruction Kidney disease Liver disease Myasthenia gravis Parkinson's disease Trouble passing urine An unusual or allergic reaction to oxybutynin, other medications, foods, dyes, or preservatives Pregnant or trying to get pregnant Breast-feeding How should I use this medication? Take this medication by mouth with a glass of water. Swallow whole, do not crush, cut, or chew. Follow the directions on the prescription label. You can take this medication with or without food. Take your doses at regular intervals. Do not take your medication more often than directed. Talk to your care team about the use of this medication in children. Special care may be needed. While this medication may be prescribed for children as young as 6 years for selected conditions, precautions do apply. Overdosage: If you think you have taken too much of this medicine contact a poison control center or emergency room at once. NOTE: This medicine is only for you. Do not share this medicine with others. What if I miss a dose? If you miss a dose, take it as soon as you can. If it is almost time for your next dose, take only that dose. Do not take double or extra doses. What may interact with this medication? Antihistamines for allergy, cough, and cold Atropine Certain medications for bladder problems, such as  oxybutynin or tolterodine Certain medications for Parkinson disease, such as benztropine or trihexyphenidyl Certain medications for stomach problems, such as dicyclomine or hyoscyamine Certain medications for travel sickness, such as scopolamine Clarithromycin Erythromycin Ipratropium Medications for fungal infections, such as fluconazole, itraconazole, ketoconazole, or voriconazole This list may not describe all possible interactions. Give your health care provider a list of all the medicines, herbs, non-prescription drugs, or dietary supplements you use. Also tell them if you smoke, drink alcohol, or use illegal drugs. Some items may interact with your medicine. What should I watch for while using this medication? Visit your care team for regular checks on your progress. It may take a few weeks to notice the full benefit from this medication. You may need to limit your intake of tea, coffee, caffeinated sodas, and alcohol. These drinks may make your symptoms worse. This medication may affect your coordination, reaction time, or judgment. Do not drive or operate machinery until you know how this medication affects you. Sit up or stand slowly to reduce the risk of dizzy or fainting spells. Drinking alcohol with this medication can increase the risk of these side effects. Your mouth may get dry. Chewing sugarless gum or sucking hard candy and drinking plenty of water may help. Contact your care team if the problem does not go away or is severe. This medication may cause dry eyes and blurred vision. If you wear contact lenses, you may feel some discomfort. Lubricating eye drops may help. See your care team if the problem does not go away or is severe. You may notice the shells of the tablets in your stool from time to time.  This is normal. Avoid extreme heat. This medication can cause you to sweat less than normal. Your body temperature could increase to dangerous levels, which may lead to heat  stroke. What side effects may I notice from receiving this medication? Side effects that you should report to your care team as soon as possible: Allergic reactions or angioedema--skin rash, itching, hives, swelling of the face, eyes, lips, tongue, arms, or legs, trouble swallowing or breathing Sudden eye pain or change in vision such as blurry vision, seeing halos around lights, vision loss Trouble passing urine Side effects that usually do not require medical attention (report to your care team if they continue or are bothersome): Confusion Constipation Dizziness Drowsiness Dry mouth Headache This list may not describe all possible side effects. Call your doctor for medical advice about side effects. You may report side effects to FDA at 1-800-FDA-1088. Where should I keep my medication? Keep out of the reach of children. Store at room temperature between 15 and 30 degrees C (59 and 86 degrees F). Protect from moisture and humidity. Throw away any unused medication after the expiration date. NOTE: This sheet is a summary. It may not cover all possible information. If you have questions about this medicine, talk to your doctor, pharmacist, or health care provider.  2024 Elsevier/Gold Standard (2022-02-28 00:00:00)

## 2024-08-13 NOTE — Progress Notes (Signed)
 GYNECOLOGY  VISIT   HPI: 78 y.o.   Widowed  Caucasian female   G0P0000 with Patient's last menstrual period was 09/03/1996.   here for: 1 year med check - Premarin     Patient is followed for atrophy.   Using Premarin  vaginal cream.   Seen last week for urinary incontinence episode. Final UC was negative.    Not getting up at night to void.    Hx OAB.   Had pelvic floor PT through [facility] in the past.  This was not helpful.  Uncertain about med use.   Dealing with ataxia and vestibular problems.   Has seen neurologist.   Followed by Dr. Faythe for osteopenia.   Patient is living at [facility].    Brother recently hospitalized.    GYNECOLOGIC HISTORY: Patient's last menstrual period was 09/03/1996. Contraception:  PMP Menopausal hormone therapy:  Premarin   Last 2 paps:  08/12/23 neg, 06/14/21, HR HPV negative, 06/08/20 neg  History of abnormal Pap or positive HPV:  no Mammogram:  08/07/24 results not back, 08/06/23 Breast Density Cat C, BIRADS Cat 1 neg         OB History     Gravida  0   Para  0   Term  0   Preterm  0   AB  0   Living  0      SAB  0   IAB  0   Ectopic  0   Multiple  0   Live Births  0              Patient Active Problem List   Diagnosis Date Noted   Dizziness 06/15/2024   Right chronic serous otitis media 06/15/2024   Impacted cerumen of right ear 06/15/2024   Double vision 12/13/2023   Sensorineural hearing loss, 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   Chronic rhinitis 06/10/2023   Other specified disorders of eustachian tube, right ear 06/10/2023   Thyroid  nodule 09/14/2022   Dyspnea on exertion 04/27/2022   Cervical radiculopathy 02/03/2021   Basal cell carcinoma (BCC) of lateral side wall of nose 11/09/2020   Hypercalcemia 04/20/2020   Numbness 04/19/2020   Left leg pain 07/08/2018   Sacral back pain 04/16/2018   Osteoporosis 08/09/2016   White matter abnormality on MRI of brain 07/11/2015   MCI (mild cognitive  impairment) 07/11/2015   Routine general medical examination at a health care facility 02/11/2015   Anxiety and depression 11/01/2010   OAB (overactive bladder)    GERD 09/23/2007    Past Medical History:  Diagnosis Date   ALLERGIC RHINITIS    Allergy  05/04/1958   Plants, animals   Ataxia    Back pain    Cancer (HCC) 12/02/2020   basal cell of nose   Cataract 2014   Surgery both eyes   Dense breast    DEPRESSION    Fibroid    GERD    Hearing loss    History of radiation therapy 12/02/2020   for basal cell carcinoma of nose   HYPERLIPIDEMIA    diet control efforts   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   Osteoporosis 2018   PMB (postmenopausal bleeding) 12/2001   polyps   Serum calcium elevated 2021   sees Dr.Ellison/pulmonology with [facility]   Thyroid  disease    hypothyroidism   URINARY INCONTINENCE    OAB   Wisdom teeth removed 1866/1967    Past Surgical History:  Procedure Laterality Date   BREAST CYST ASPIRATION  12/05/2001   CATARACT EXTRACTION Bilateral 07/2012, 09/2012   DILATION AND CURETTAGE OF UTERUS  2003   EYE SURGERY  2014   Cataracts   HYSTEROSCOPY  01/2002   resection, sm fibroid   Left knee surgey     MENISCUS REPAIR     left knee 2010   REFRACTIVE SURGERY  02/2016   TYMPANOSTOMY TUBE PLACEMENT Right 06/18/2024   WISDOM TOOTH EXTRACTION      Current Outpatient Medications  Medication Sig Dispense Refill   Calcium Citrate-Vitamin D  (CITRACAL + D PO) Take 1,200 mg by mouth daily.     Cholecalciferol (VITAMIN D3) 2000 units TABS Take 1 tablet by mouth daily.     conjugated estrogens  (PREMARIN ) vaginal cream USE 0.5 GRAM VAGINALLY 2 TIMES A WEEK 30 g 1   fluticasone  (FLONASE) 50 MCG/ACT nasal spray Place 2 sprays into both nostrils daily.     levothyroxine  (SYNTHROID ) 25 MCG tablet TAKE 1 TABLET(25 MCG) BY MOUTH DAILY BEFORE BREAKFAST 90 tablet 3   Multiple Vitamins-Minerals (CENTRUM SILVER) tablet Take 1 tablet by mouth daily.     Multiple  Vitamins-Minerals (PRESERVISION AREDS 2 PO) Take by mouth.     PREVIDENT 5000 BOOSTER PLUS 1.1 % PSTE 3 (three) times daily as needed.     Propylene Glycol (SYSTANE COMPLETE OP) Apply to eye.     No current facility-administered medications for this visit.     ALLERGIES: Patient has no known allergies.  Family History  Problem Relation Age of Onset   Alzheimer's disease Mother    Allergies Mother    Breast cancer Mother 39   Dementia Mother    Lung cancer Father 38   Allergies Brother    Diabetes Brother    Transient ischemic attack Maternal Grandmother    Peripheral Artery Disease Maternal Grandmother        s/p B leg amputation   Stroke Maternal Grandmother    Tuberculosis Maternal Grandfather    Diabetes Brother 22   Alzheimer's disease Brother 77       early onset   Sudden death Paternal Grandmother 30       unknown   Breast cancer Paternal Aunt    Osteoporosis Neg Hx     Social History   Socioeconomic History   Marital status: Widowed    Spouse name: Not on file   Number of children: Not on file   Years of education: Not on file   Highest education level: Associate degree: occupational, scientist, product/process development, or vocational program  Occupational History   Not on file  Tobacco Use   Smoking status: Never   Smokeless tobacco: Never  Vaping Use   Vaping status: Never Used  Substance and Sexual Activity   Alcohol use: No    Alcohol/week: 1.0 standard drink of alcohol    Types: 1 Standard drinks or equivalent per week   Drug use: No   Sexual activity: Not Currently    Partners: Male    Birth control/protection: Post-menopausal    Comment: less than 5, IC after 17, no std, no abnormal pap, no des  Other Topics Concern   Not on file  Social History Narrative   Not on file   Social Drivers of Health   Tobacco Use: Low Risk (08/13/2024)   Patient History    Smoking Tobacco Use: Never    Smokeless Tobacco Use: Never    Passive Exposure: Not on file  Financial  Resource Strain: Low Risk (02/17/2024)   Overall Financial Resource Strain (  CARDIA)    Difficulty of Paying Living Expenses: Not hard at all  Food Insecurity: No Food Insecurity (02/17/2024)   Epic    Worried About Programme Researcher, Broadcasting/film/video in the Last Year: Never true    Ran Out of Food in the Last Year: Never true  Transportation Needs: No Transportation Needs (02/17/2024)   Epic    Lack of Transportation (Medical): No    Lack of Transportation (Non-Medical): No  Physical Activity: Sufficiently Active (02/17/2024)   Exercise Vital Sign    Days of Exercise per Week: 3 days    Minutes of Exercise per Session: 60 min  Recent Concern: Physical Activity - Insufficiently Active (12/06/2023)   Exercise Vital Sign    Days of Exercise per Week: 1 day    Minutes of Exercise per Session: 10 min  Stress: No Stress Concern Present (02/17/2024)   [employer] of Occupational Health - Occupational Stress Questionnaire    Feeling of Stress: Not at all  Social Connections: Moderately Isolated (02/17/2024)   Social Connection and Isolation Panel    Frequency of Communication with Friends and Family: Twice a week    Frequency of Social Gatherings with Friends and Family: Three times a week    Attends Religious Services: 1 to 4 times per year    Active Member of Clubs or Organizations: No    Attends Banker Meetings: Never    Marital Status: Widowed  Intimate Partner Violence: Not At Risk (02/17/2024)   Epic    Fear of Current or Ex-Partner: No    Emotionally Abused: No    Physically Abused: No    Sexually Abused: No  Depression (PHQ2-9): Low Risk (02/17/2024)   Depression (PHQ2-9)    PHQ-2 Score: 3  Alcohol Screen: Low Risk (02/17/2024)   Alcohol Screen    Last Alcohol Screening Score (AUDIT): 0  Housing: Unknown (02/17/2024)   Epic    Unable to Pay for Housing in the Last Year: No    Number of Times Moved in the Last Year: Not on file    Homeless in the Last Year: No  Utilities: Not At  Risk (02/17/2024)   Epic    Threatened with loss of utilities: No  Health Literacy: Adequate Health Literacy (02/17/2024)   B1300 Health Literacy    Frequency of need for help with medical instructions: Never    Review of Systems  All other systems reviewed and are negative.   PHYSICAL EXAMINATION:   BP 116/74 (BP Location: Left Arm, Patient Position: Sitting)   Pulse 74   Ht 5' 5.5 (1.664 m)   Wt 133 lb (60.3 kg)   LMP 09/03/1996   SpO2 98%   BMI 21.80 kg/m     General appearance: alert, cooperative and appears stated age Head: Normocephalic, without obvious abnormality, atraumatic Neck: no adenopathy, supple, symmetrical, trachea midline and thyroid  normal to inspection and palpation Lungs: clear to auscultation bilaterally Breasts: normal appearance, no masses or tenderness, No nipple retraction or dimpling, No nipple discharge or bleeding, No axillary or supraclavicular adenopathy Heart: regular rate and rhythm Abdomen: soft, non-tender, no masses,  no organomegaly Extremities: extremities normal, atraumatic, no cyanosis or edema Skin: Skin color, texture, turgor normal. No rashes or lesions Lymph nodes: Cervical, supraclavicular, and axillary nodes normal. No abnormal inguinal nodes palpated Neurologic: Grossly normal  Pelvic: External genitalia:  no lesions              Urethra:  normal appearing urethra with no masses,  tenderness or lesions              Bartholins and Skenes: normal                 Vagina: normal appearing vagina with normal color and discharge, no lesions              Cervix: no lesions                Bimanual Exam:  Uterus:  normal size, contour, position, consistency, mobility, non-tender              Adnexa: no mass, fullness, tenderness               Chaperone was present for exam:  Kari HERO, CMA  ASSESSMENT:  Mixed incontinence.   Hx incomplete uterovaginal prolapse. Hx osteoporosis.  Now osteopenia.  Status post Reclast .   Hypercalcemia. Compression fracture of thoracic/lumbar region. Vulvovaginal atrophy.  FH of breast cancer in mother. Husband, now deceased, had HPV head and neck cancer.  Patient is negative for HR HPV.  Cerebellar atrophy.    PLAN:  Overactive bladder discussed.  Rx for Ditropan  XL 5 mg daily.  Side effects discussed.  Refill of Premarin  vaginal cream, 0.5 grams pv at hs 2 - 3 times per week.  Yearly mammogram recommended.  Self breast exam reviewed.  Follow up in 6 weeks for a recheck.   Breast and pelvic exam in 1 year.   30 min  total time was spent for this patient encounter, including preparation, face-to-face counseling with the patient, coordination of care, and documentation of the encounter.

## 2024-08-16 ENCOUNTER — Ambulatory Visit: Payer: Self-pay | Admitting: Obstetrics and Gynecology

## 2024-08-16 NOTE — Telephone Encounter (Signed)
 Please contact patient in follow up to her visit with me on 08/13/24.   I gave her a new prescription for low dose Ditropan  XL 5 mg daily for overactive bladder with urinary incontinence.   She has a new diagnosis of cerebellar ataxia.  I have been reviewing her chart further and some of her test results.  I want to be sure this medication will not cause her increasing side effects of her ataxia/diplopia.   Let her know I would like to have her neurologist at [facility] give feedback as to whether this anticholinergic would be acceptable for her use.    Please contact her neurologist at the following for me to receive an opinion about the Ditropan  XL:   Deepal Christinia Jeans, MD (Attending) [facility] Department of Neurology [facility] [phone] (Work) [phone] (Fax)

## 2024-08-17 NOTE — Progress Notes (Signed)
 Left message with Dr. Vicenta office for call back.

## 2024-08-17 NOTE — Progress Notes (Signed)
 Left 2nd message at [phone] for a return call to triage after missing call from Dr. Valdemar.

## 2024-08-18 ENCOUNTER — Encounter: Payer: Self-pay | Admitting: Rehabilitative and Restorative Service Providers"

## 2024-08-18 ENCOUNTER — Ambulatory Visit: Admitting: Rehabilitative and Restorative Service Providers"

## 2024-08-18 DIAGNOSIS — R2689 Other abnormalities of gait and mobility: Secondary | ICD-10-CM

## 2024-08-18 DIAGNOSIS — H8113 Benign paroxysmal vertigo, bilateral: Secondary | ICD-10-CM

## 2024-08-18 DIAGNOSIS — R42 Dizziness and giddiness: Secondary | ICD-10-CM | POA: Diagnosis not present

## 2024-08-18 DIAGNOSIS — R262 Difficulty in walking, not elsewhere classified: Secondary | ICD-10-CM

## 2024-08-18 DIAGNOSIS — M6281 Muscle weakness (generalized): Secondary | ICD-10-CM

## 2024-08-18 NOTE — Therapy (Signed)
 OUTPATIENT PHYSICAL THERAPY VESTIBULAR TREATMENT NOTE     Patient Name: Mary Malone MRN: [id number] DOB:[birth year], 78 y.o., female Today's Date: 08/18/2024  END OF SESSION:  PT End of Session - 08/18/24 1449     Visit Number 8    Date for Recertification  10/02/24    Authorization Type [employer] ([employer] approved 08/06/2024 - 09/03/2024    Authorization - Visit Number 1    Authorization - Number of Visits 6    Progress Note Due on Visit 15    PT Start Time 1447    PT Stop Time 1525    PT Time Calculation (min) 38 min    Activity Tolerance Patient tolerated treatment well    Behavior During Therapy [facility] for tasks assessed/performed          Past Medical History:  Diagnosis Date   ALLERGIC RHINITIS    Allergy  05/04/1958   Plants, animals   Ataxia    Back pain    Cancer (HCC) 12/02/2020   basal cell of nose   Cataract 2014   Surgery both eyes   Dense breast    DEPRESSION    Fibroid    GERD    Hearing loss    History of radiation therapy 12/02/2020   for basal cell carcinoma of nose   HYPERLIPIDEMIA    diet control efforts   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   Osteoporosis 2018   PMB (postmenopausal bleeding) 12/2001   polyps   Serum calcium elevated 2021   sees Dr.Ellison/pulmonology with [place]   Thyroid  disease    hypothyroidism   URINARY INCONTINENCE    OAB   Wisdom teeth removed 1866/1967   Past Surgical History:  Procedure Laterality Date   BREAST CYST ASPIRATION  12/05/2001   CATARACT EXTRACTION Bilateral 07/2012, 09/2012   DILATION AND CURETTAGE OF UTERUS  2003   EYE SURGERY  2014   Cataracts   HYSTEROSCOPY  01/2002   resection, sm fibroid   Left knee surgey     MENISCUS REPAIR     left knee 2010   REFRACTIVE SURGERY  02/2016   TYMPANOSTOMY TUBE PLACEMENT Right 06/18/2024   WISDOM TOOTH EXTRACTION     Patient Active Problem List   Diagnosis Date Noted   Dizziness 06/15/2024   Right chronic serous otitis media 06/15/2024    Impacted cerumen of right ear 06/15/2024   Double vision 12/13/2023   Sensorineural hearing loss, 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   Chronic rhinitis 06/10/2023   Other specified disorders of eustachian tube, right ear 06/10/2023   Thyroid  nodule 09/14/2022   Dyspnea on exertion 04/27/2022   Cervical radiculopathy 02/03/2021   Basal cell carcinoma (BCC) of lateral side wall of nose 11/09/2020   Hypercalcemia 04/20/2020   Numbness 04/19/2020   Left leg pain 07/08/2018   Sacral back pain 04/16/2018   Osteoporosis 08/09/2016   White matter abnormality on MRI of brain 07/11/2015   MCI (mild cognitive impairment) 07/11/2015   Routine general medical examination at a health care facility 02/11/2015   Anxiety and depression 11/01/2010   OAB (overactive bladder)    GERD 09/23/2007    PCP: Rollene Almarie LABOR, MD REFERRING PROVIDER: Karis Clunes, MD  REFERRING DIAG: R42 (ICD-10-CM) - Dizziness  THERAPY DIAG:  Dizziness and giddiness  Other abnormalities of gait and mobility  Muscle weakness (generalized)  Difficulty in walking, not elsewhere classified  BPPV (benign paroxysmal positional vertigo), bilateral  ONSET DATE: June 03, 2024  Rationale for Evaluation and Treatment:  Rehabilitation  SUBJECTIVE:   SUBJECTIVE STATEMENT: Patient reports that her brother was transferred to a SNF today.  She states that she has had a lot going on and is having some dizziness today.  Pt accompanied by: self  PERTINENT HISTORY:  Ataxia, Cerebellar atrophy, macular degeneration, diplopia, right ear eustachian tube dysfunction (scheduled for tympanostomy tube placement), osteopenia  PAIN:  Are you having pain? Yes: NPRS scale: 1/10 Pain location: headache Pain description: ache Aggravating factors: rotation Relieving factors: rest  PRECAUTIONS: Fall  RED FLAGS: None   WEIGHT BEARING RESTRICTIONS: No  FALLS: Has patient fallen in last 6 months? No  LIVING ENVIRONMENT: Lives with: lives  alone Lives in: House/apartment Stairs: single level condo in [place] Has following equipment at home: Single point cane and Grab bars  PLOF: Independent and Leisure: walking with friends in community  PATIENT GOALS: To be able to return to walking with her friends in the community and decrease dizziness.  OBJECTIVE:  Note: Objective measures were completed at Evaluation unless otherwise noted.  DIAGNOSTIC FINDINGS:  Brain MRI on 03/15/2023: IMPRESSION: Unremarkable MRI of the head and orbits for patient age.  COGNITION: Overall cognitive status: Within functional limits for tasks assessed    POSTURE:  rounded shoulders and forward head  Cervical ROM:    Active A/ROM (deg) eval A/ROM (Deg) 07/22/24  Flexion 40 53  Extension 35 37  Right lateral flexion 25 25  Left lateral flexion 25 25  Right rotation 38 38  Left rotation 50 60  (Blank rows = not tested)  STRENGTH:  Eval: Bilateral hip strength grossly 3+/5 Bilateral quad strength of 5-/5 Bilateral shoulder strength 4-/5  07/22/2024: Bilateral hip strength of 3+ to 4-/5 grossly throughout Right quad strength is 5/5, left quad is 5-/5 Bilateral shoulder strength 4-/5    GAIT: Gait pattern: ataxic Distance walked: >500 ft Assistive device utilized: Single point cane Level of assistance: Modified independence Comments: slower gait velocity  FUNCTIONAL TESTS:  Eval: 5 times sit to stand: 19.44 sec with UE use Timed up and go (TUG): 19.03 sec with [facility]  06/24/2024: 6 minute walk test:  900 ft (age related norms are 1,393 ft)  07/22/2024: 5 times sit to stand: 12.24 sec with UE use 6 minute walk test:  966 ft   PATIENT SURVEYS:  Eval: Dizziness Handicapped Inventory:  Total Score: 42 / 100  08/05/2024:  DHI Total Score: 36 / 100  VESTIBULAR ASSESSMENT:    SYMPTOM BEHAVIOR:  Subjective history: Started on June 03, 2024 with severe dizziness  Non-Vestibular symptoms: changes in hearing,  diplopia, neck pain, and tinnitus  Type of dizziness: Diplopia and Spinning/Vertigo  Frequency: daily  Duration: constant  Aggravating factors: Induced by position change: rolling to the right and Induced by motion: turning head quickly  Relieving factors: head stationary and rest  Progression of symptoms: unchanged  OCULOMOTOR EXAM:  Ocular Alignment: normal  Ocular ROM: No Limitations  Spontaneous Nystagmus: absent  Gaze-Induced Nystagmus: absent  Smooth Pursuits: intact with horizontal movements, but saccades noted with vertical movements  Saccades: dysmetria     VESTIBULAR - OCULAR REFLEX:     Head-Impulse Test: HIT Right: positive HIT Left: positive     POSITIONAL TESTING: Right Dix-Hallpike: upbeating, right nystagmus Left Dix-Hallpike: slow moving nystagmus noted    OTHOSTATICS: not done  TREATMENT DATE:   08/18/2024: Nustep level 5 x5 min with PT present to discuss status Dix Hallpike negative on the right. Dix Hallpike positive on the left.  Proceeded with canalith repositioning with the Epley for the left side. Seated with 3# ankle weight:  LAQ, marching, and heel/toe raises.  2x10 each bilat Leg Press (seat at 6) 60# 2x10 Standing rocker board for DF/PF x1 min Standing balance with rocker board in lateral position x1 min   08/12/2024: Nustep level 5 x6 min with PT present to discuss status Seated with 3# ankle weight:  LAQ, marching, and heel/toe raises.  2x10 each bilat Leg Press (seat at 6) 60# 2x10 Standing on foam pad performing slow horizontal head rotations with CGA 2x1 min Standing on foam pad performing hip abduction x10 bilat Dix Hallpike positive on the right, proceeded with Epley Maneuver for right canalith repositioning x1   08/05/2024: Nustep level 5 x5 min with PT present to discuss status Seated with 3# ankle weight:   LAQ, marching, and heel/toe raises.  2x10 each bilat O2 sats 95%, HR 97 bpm, BP 152/74 Leg Press (seat at 6) 50# 5x10 Dix Hallpike positive on the right, proceeded with Epley Maneuver for right canalith repositioning x1   PATIENT EDUCATION: Education details: Issued HEP Person educated: Patient Education method: Explanation, Demonstration, and Handouts Education comprehension: verbalized understanding and returned demonstration  HOME EXERCISE PROGRAM: Access Code: [facility] URL: [email or web address] Date: 06/24/2024 Prepared by: Jarrell Deshanna Kama  Exercises - Seated Cervical Rotation AROM  - 1 x daily - 7 x weekly - 2 sets - 10 reps - Seated Scapular Retraction  - 1 x daily - 7 x weekly - 2 sets - 10 reps - Sit to Stand  - 1 x daily - 7 x weekly - 2 sets - 10 reps - Standing Marching  - 1 x daily - 7 x weekly - 2 sets - 10 reps - Seated Horizontal Smooth Pursuit  - 1 x daily - 7 x weekly - 2 sets - 10 reps - Seated Vertical Smooth Pursuit  - 1 x daily - 7 x weekly - 2 sets - 10 reps - Seated Proximal-Distal Smooth Pursuit  - 1 x daily - 7 x weekly - 2 sets - 10 reps - Brandt-Daroff Vestibular Exercise  - 1 x daily - 7 x weekly - 3-5 reps  GOALS: Goals reviewed with patient? Yes  SHORT TERM GOALS: Target date: 07/10/2024  Patient will be compliant with initial HEP. Baseline: Goal status: MET  2.  Patient will participate in a 6 min walk test to establish a baseline measurement. Baseline:  Goal status: MET on 06/24/24  3.  Patient will report a 30% improvement in dizziness. Baseline:  Goal status: IN PROGRESS   LONG TERM GOALS: Target date: 10/03/2024  Patient will be independent with advanced HEP to allow for self progression after discharge. Baseline:  Goal status: IN PROGRESS  2.  Patient will improve Dizziness Handicapped Inventory to no greater than 15% to demonstrate improvement in functional tasks. Baseline: 42/100 Goal status: IN PROGRESS  3.   Patient will report ability to walk with her community friends for >15 minutes with keeping up pace and 50% less fatigue. Baseline: reports slower gait and increased dyspnea Goal status: IN PROGRESS  4.  Patient will increase her UE strength to [facility] to allow her to pick up her cat and place it in the carrier. Baseline:  Goal status: IN PROGRESS  5.  Patient will increase  LE strength to [facility] to allow her to return to gym at [facility] and perform weight machines. Baseline:  Goal status: IN PROGRESS  6.  Patient will increase cervical ROM to [facility] to allow her to look both directions to more easily cross the road and less pain with driving. Baseline:  Goal status: IN PROGRESS (see above)  ASSESSMENT:  CLINICAL IMPRESSION:  Ms Hajjar presents to skilled PT reporting that she feels that the stressors going on in her life are causing her increased dizziness.  Patient with negative Trenda Craze on the right, so tested the left and patient with positive result.  Proceeded with canalith repositioning on the left with Epley maneuver and patient reported feeling better after.  Patient continues to have decreased balance and requires UE support with standing lateral balance on rocker board in lateral position.  Patient continues to require skilled PT to progress towards goal related activities.  OBJECTIVE IMPAIRMENTS: Abnormal gait, decreased balance, difficulty walking, decreased ROM, decreased strength, dizziness, impaired perceived functional ability, increased muscle spasms, impaired flexibility, postural dysfunction, and pain.   ACTIVITY LIMITATIONS: bending, standing, squatting, bed mobility, and locomotion level  PARTICIPATION LIMITATIONS: cleaning, laundry, driving, and community activity  PERSONAL FACTORS: Past/current experiences, Time since onset of injury/illness/exacerbation, and 3+ comorbidities: Osteopenia, Sensorinerual hearing loss, macular degeneration are also affecting patient's  functional outcome.   REHAB POTENTIAL: Good  CLINICAL DECISION MAKING: Evolving/moderate complexity  EVALUATION COMPLEXITY: Moderate   PLAN:  PT FREQUENCY: 1-2x/week  PT DURATION: 8 weeks  PLANNED INTERVENTIONS: 97164- PT Re-evaluation, 97750- Physical Performance Testing, 97110-Therapeutic exercises, 97530- Therapeutic activity, 97112- Neuromuscular re-education, 97535- Self Care, 02859- Manual therapy, [phone]- Gait training, [phone]- Canalith repositioning, [id number]- Aquatic Therapy, [phone]- Electrical stimulation (unattended), [phone]- Electrical stimulation (manual), [id number]- Vasopneumatic device, [id number]- Ultrasound, [id number]- Traction (mechanical), [id number]- Ionotophoresis 4mg /ml Dexamethasone, 79439 (1-2 muscles), 20561 (3+ muscles)- Dry Needling, Patient/Family education, Balance training, Stair training, Taping, Joint mobilization, Joint manipulation, Spinal manipulation, Spinal mobilization, Vestibular training, Cryotherapy, and Moist heat  PLAN FOR NEXT SESSION: Assess and progress HEP as indicated, strengthening, flexibility, vestibular rehab   Jarrell Laming, PT, DPT 08/18/2024, 3:35 PM  [facility] [address], [address] [place], [place] [address] Phone # [phone] Fax [phone]

## 2024-09-02 ENCOUNTER — Ambulatory Visit: Admitting: Rehabilitative and Restorative Service Providers"

## 2024-09-02 ENCOUNTER — Encounter: Payer: Self-pay | Admitting: Rehabilitative and Restorative Service Providers"

## 2024-09-02 DIAGNOSIS — R42 Dizziness and giddiness: Secondary | ICD-10-CM

## 2024-09-02 DIAGNOSIS — R262 Difficulty in walking, not elsewhere classified: Secondary | ICD-10-CM

## 2024-09-02 DIAGNOSIS — H8113 Benign paroxysmal vertigo, bilateral: Secondary | ICD-10-CM

## 2024-09-02 DIAGNOSIS — R2689 Other abnormalities of gait and mobility: Secondary | ICD-10-CM

## 2024-09-02 DIAGNOSIS — M6281 Muscle weakness (generalized): Secondary | ICD-10-CM

## 2024-09-02 NOTE — Therapy (Signed)
 " OUTPATIENT PHYSICAL THERAPY VESTIBULAR TREATMENT NOTE     Patient Name: Mary Malone MRN: [id number] DOB:09-26-1945, 78 y.o., female Today's Date: 09/02/2024  END OF SESSION:  PT End of Session - 09/02/24 1235     Visit Number 9    Date for Recertification  10/02/24    Authorization Type [employer] ([employer] approved 08/06/2024 - 09/03/2024    Authorization - Visit Number 2    Authorization - Number of Visits 6    Progress Note Due on Visit 15    PT Start Time 1230    PT Stop Time 1310    PT Time Calculation (min) 40 min    Activity Tolerance Patient tolerated treatment well    Behavior During Therapy [facility] for tasks assessed/performed          Past Medical History:  Diagnosis Date   ALLERGIC RHINITIS    Allergy  05/04/1958   Plants, animals   Ataxia    Back pain    Cancer (HCC) 12/02/2020   basal cell of nose   Cataract 2014   Surgery both eyes   Dense breast    DEPRESSION    Fibroid    GERD    Hearing loss    History of radiation therapy 12/02/2020   for basal cell carcinoma of nose   HYPERLIPIDEMIA    diet control efforts   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   Osteoporosis 2018   PMB (postmenopausal bleeding) 12/2001   polyps   Serum calcium elevated 2021   sees Dr.Ellison/pulmonology with [place]   Thyroid  disease    hypothyroidism   URINARY INCONTINENCE    OAB   Wisdom teeth removed 1866/1967   Past Surgical History:  Procedure Laterality Date   BREAST CYST ASPIRATION  12/05/2001   CATARACT EXTRACTION Bilateral 07/2012, 09/2012   DILATION AND CURETTAGE OF UTERUS  2003   EYE SURGERY  2014   Cataracts   HYSTEROSCOPY  01/2002   resection, sm fibroid   Left knee surgey     MENISCUS REPAIR     left knee 2010   REFRACTIVE SURGERY  02/2016   TYMPANOSTOMY TUBE PLACEMENT Right 06/18/2024   WISDOM TOOTH EXTRACTION     Patient Active Problem List   Diagnosis Date Noted   Dizziness 06/15/2024   Right chronic serous otitis media 06/15/2024    Impacted cerumen of right ear 06/15/2024   Double vision 12/13/2023   Sensorineural hearing loss, 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   Chronic rhinitis 06/10/2023   Other specified disorders of eustachian tube, right ear 06/10/2023   Thyroid  nodule 09/14/2022   Dyspnea on exertion 04/27/2022   Cervical radiculopathy 02/03/2021   Basal cell carcinoma (BCC) of lateral side wall of nose 11/09/2020   Hypercalcemia 04/20/2020   Numbness 04/19/2020   Left leg pain 07/08/2018   Sacral back pain 04/16/2018   Osteoporosis 08/09/2016   White matter abnormality on MRI of brain 07/11/2015   MCI (mild cognitive impairment) 07/11/2015   Routine general medical examination at a health care facility 02/11/2015   Anxiety and depression 11/01/2010   OAB (overactive bladder)    GERD 09/23/2007    PCP: Rollene Almarie LABOR, MD REFERRING PROVIDER: Karis Clunes, MD  REFERRING DIAG: R42 (ICD-10-CM) - Dizziness  THERAPY DIAG:  Dizziness and giddiness  Other abnormalities of gait and mobility  Muscle weakness (generalized)  Difficulty in walking, not elsewhere classified  BPPV (benign paroxysmal positional vertigo), bilateral  ONSET DATE: June 03, 2024  Rationale for Evaluation and  Treatment: Rehabilitation  SUBJECTIVE:   SUBJECTIVE STATEMENT: Patient reports that she has had a busy morning.  Pt accompanied by: self  PERTINENT HISTORY:  Ataxia, Cerebellar atrophy, macular degeneration, diplopia, right ear eustachian tube dysfunction (scheduled for tympanostomy tube placement), osteopenia  PAIN:  Are you having pain? Yes: NPRS scale: 2/10 Pain location: headache Pain description: ache Aggravating factors: rotation Relieving factors: rest  PRECAUTIONS: Fall  RED FLAGS: None   WEIGHT BEARING RESTRICTIONS: No  FALLS: Has patient fallen in last 6 months? No  LIVING ENVIRONMENT: Lives with: lives alone Lives in: House/apartment Stairs: single level condo [place] Has following  equipment at home: Single point cane and Grab bars  PLOF: Independent and Leisure: walking with friends in community  PATIENT GOALS: To be able to return to walking with her friends in the community and decrease dizziness.  OBJECTIVE:  Note: Objective measures were completed at Evaluation unless otherwise noted.  DIAGNOSTIC FINDINGS:  Brain MRI on 03/15/2023: IMPRESSION: Unremarkable MRI of the head and orbits for patient age.  COGNITION: Overall cognitive status: Within functional limits for tasks assessed    POSTURE:  rounded shoulders and forward head  Cervical ROM:    Active A/ROM (deg) eval A/ROM (Deg) 07/22/24  Flexion 40 53  Extension 35 37  Right lateral flexion 25 25  Left lateral flexion 25 25  Right rotation 38 38  Left rotation 50 60  (Blank rows = not tested)  STRENGTH:  Eval: Bilateral hip strength grossly 3+/5 Bilateral quad strength of 5-/5 Bilateral shoulder strength 4-/5  07/22/2024: Bilateral hip strength of 3+ to 4-/5 grossly throughout Right quad strength is 5/5, left quad is 5-/5 Bilateral shoulder strength 4-/5    GAIT: Gait pattern: ataxic Distance walked: >500 ft Assistive device utilized: Single point cane Level of assistance: Modified independence Comments: slower gait velocity  FUNCTIONAL TESTS:  Eval: 5 times sit to stand: 19.44 sec with UE use Timed up and go (TUG): 19.03 sec with [facility]  06/24/2024: 6 minute walk test:  900 ft (age related norms are 1,393 ft)  07/22/2024: 5 times sit to stand: 12.24 sec with UE use 6 minute walk test:  966 ft  09/02/2024: 5 times sit to stand: 11.82 sec with UE use 6 minute walk test:  1,043 ft   PATIENT SURVEYS:  Eval: Dizziness Handicapped Inventory:  Total Score: 42 / 100  08/05/2024:  DHI Total Score: 36 / 100  09/02/2024:  Dizziness Handicapped Inventory:  Total Score: 22/100  VESTIBULAR ASSESSMENT:    SYMPTOM BEHAVIOR:  Subjective history: Started on June 03, 2024  with severe dizziness  Non-Vestibular symptoms: changes in hearing, diplopia, neck pain, and tinnitus  Type of dizziness: Diplopia and Spinning/Vertigo  Frequency: daily  Duration: constant  Aggravating factors: Induced by position change: rolling to the right and Induced by motion: turning head quickly  Relieving factors: head stationary and rest  Progression of symptoms: unchanged  OCULOMOTOR EXAM:  Ocular Alignment: normal  Ocular ROM: No Limitations  Spontaneous Nystagmus: absent  Gaze-Induced Nystagmus: absent  Smooth Pursuits: intact with horizontal movements, but saccades noted with vertical movements  Saccades: dysmetria     VESTIBULAR - OCULAR REFLEX:     Head-Impulse Test: HIT Right: positive HIT Left: positive     POSITIONAL TESTING: Right Dix-Hallpike: upbeating, right nystagmus Left Dix-Hallpike: slow moving nystagmus noted    OTHOSTATICS: not done  TREATMENT DATE:   09/02/2024: Nustep level 5 x5 min with PT present to discuss status Sit to/from stand x5 6 minute walk test:  1,043 ft Dizziness Handicapped Inventory:  Total Score: 22/100 Dix Hallpike negative on the right. Dix Hallpike positive on the left.  Proceeded with canalith repositioning with the Epley for the left side   08/18/2024: Nustep level 5 x5 min with PT present to discuss status Dix Hallpike negative on the right. Dix Hallpike positive on the left.  Proceeded with canalith repositioning with the Epley for the left side. Seated with 3# ankle weight:  LAQ, marching, and heel/toe raises.  2x10 each bilat Leg Press (seat at 6) 60# 2x10 Standing rocker board for DF/PF x1 min Standing balance with rocker board in lateral position x1 min   08/12/2024: Nustep level 5 x6 min with PT present to discuss status Seated with 3# ankle weight:  LAQ, marching, and heel/toe  raises.  2x10 each bilat Leg Press (seat at 6) 60# 2x10 Standing on foam pad performing slow horizontal head rotations with CGA 2x1 min Standing on foam pad performing hip abduction x10 bilat Dix Hallpike positive on the right, proceeded with Epley Maneuver for right canalith repositioning x1   PATIENT EDUCATION: Education details: Issued HEP Person educated: Patient Education method: Programmer, Multimedia, Facilities Manager, and Handouts Education comprehension: verbalized understanding and returned demonstration  HOME EXERCISE PROGRAM: Access Code: [facility] URL: [email or web address] Date: 06/24/2024 Prepared by: Jarrell Noga Fogg  Exercises - Seated Cervical Rotation AROM  - 1 x daily - 7 x weekly - 2 sets - 10 reps - Seated Scapular Retraction  - 1 x daily - 7 x weekly - 2 sets - 10 reps - Sit to Stand  - 1 x daily - 7 x weekly - 2 sets - 10 reps - Standing Marching  - 1 x daily - 7 x weekly - 2 sets - 10 reps - Seated Horizontal Smooth Pursuit  - 1 x daily - 7 x weekly - 2 sets - 10 reps - Seated Vertical Smooth Pursuit  - 1 x daily - 7 x weekly - 2 sets - 10 reps - Seated Proximal-Distal Smooth Pursuit  - 1 x daily - 7 x weekly - 2 sets - 10 reps - Brandt-Daroff Vestibular Exercise  - 1 x daily - 7 x weekly - 3-5 reps  GOALS: Goals reviewed with patient? Yes  SHORT TERM GOALS: Target date: 07/10/2024  Patient will be compliant with initial HEP. Baseline: Goal status: MET  2.  Patient will participate in a 6 min walk test to establish a baseline measurement. Baseline:  Goal status: MET on 06/24/24  3.  Patient will report a 30% improvement in dizziness. Baseline:  Goal status: MET   LONG TERM GOALS: Target date: 10/03/2024  Patient will be independent with advanced HEP to allow for self progression after discharge. Baseline:  Goal status: IN PROGRESS  2.  Patient will improve Dizziness Handicapped Inventory to no greater than 15% to demonstrate improvement in  functional tasks. Baseline: 42/100 Goal status: IN PROGRESS  3.  Patient will report ability to walk with her community friends for >15 minutes with keeping up pace and 50% less fatigue. Baseline: reports slower gait and increased dyspnea Goal status: IN PROGRESS  4.  Patient will increase her UE strength to [facility] to allow her to pick up her cat and place it in the carrier. Baseline:  Goal status: IN PROGRESS  5.  Patient will increase LE strength to [facility]  to allow her to return to gym at [facility] and perform weight machines. Baseline:  Goal status: IN PROGRESS  6.  Patient will increase cervical ROM to [facility] to allow her to look both directions to more easily cross the road and less pain with driving. Baseline:  Goal status: IN PROGRESS (see above)  ASSESSMENT:  CLINICAL IMPRESSION:  Ms Weide presents to skilled PT reporting that has been having increased unsteadiness and ataxia.  Patient did have an improved score on Dizziness Handicapped Inventory today.  Patient additionally had increased distance on her 6 minute walk test today.  Patient continues to have positive Trenda Craze on the left, but negative on the right.  Patient does report feeling better after she has her PT treatment sessions.  Patient educated on the effects of stress on the body, especially with her ataxia and she verbalizes understanding that she agrees that the stress of her brother being in a SNF has likely made her ataxia worse.  Patient continues to progress with goal related activities and has met all short-term goals and is progressing towards long term goals.  Patient continues to require skilled PT to progress towards goal related activities and improved balance and decreased dizziness as well as improved functional strengthening.  OBJECTIVE IMPAIRMENTS: Abnormal gait, decreased balance, difficulty walking, decreased ROM, decreased strength, dizziness, impaired perceived functional ability, increased muscle  spasms, impaired flexibility, postural dysfunction, and pain.   ACTIVITY LIMITATIONS: bending, standing, squatting, bed mobility, and locomotion level  PARTICIPATION LIMITATIONS: cleaning, laundry, driving, and community activity  PERSONAL FACTORS: Past/current experiences, Time since onset of injury/illness/exacerbation, and 3+ comorbidities: Osteopenia, Sensorinerual hearing loss, macular degeneration are also affecting patient's functional outcome.   REHAB POTENTIAL: Good  CLINICAL DECISION MAKING: Evolving/moderate complexity  EVALUATION COMPLEXITY: Moderate   PLAN:  PT FREQUENCY: 1-2x/week  PT DURATION: 8 weeks  PLANNED INTERVENTIONS: 97164- PT Re-evaluation, 97750- Physical Performance Testing, 97110-Therapeutic exercises, 97530- Therapeutic activity, 97112- Neuromuscular re-education, 97535- Self Care, [id number]- Manual therapy, [phone]- Gait training, [phone]- Canalith repositioning, [id number]- Aquatic Therapy, [phone]- Electrical stimulation (unattended), [phone]- Electrical stimulation (manual), [id number]- Vasopneumatic device, [id number]- Ultrasound, [id number]- Traction (mechanical), [id number]- Ionotophoresis 4mg /ml Dexamethasone, [id number] (1-2 muscles), [id number] (3+ muscles)- Dry Needling, Patient/Family education, Balance training, Stair training, Taping, Joint mobilization, Joint manipulation, Spinal manipulation, Spinal mobilization, Vestibular training, Cryotherapy, and Moist heat  PLAN FOR NEXT SESSION: Assess and progress HEP as indicated, strengthening, flexibility, vestibular rehab   Jarrell Laming, PT, DPT 09/02/2024, 1:38 PM  [facility] [address], [address] [place], [place] [address] Phone # [phone] Fax [phone]   "

## 2024-09-09 ENCOUNTER — Ambulatory Visit: Attending: Otolaryngology | Admitting: Rehabilitative and Restorative Service Providers"

## 2024-09-09 ENCOUNTER — Encounter: Payer: Self-pay | Admitting: Rehabilitative and Restorative Service Providers"

## 2024-09-09 DIAGNOSIS — R2689 Other abnormalities of gait and mobility: Secondary | ICD-10-CM | POA: Diagnosis present

## 2024-09-09 DIAGNOSIS — R262 Difficulty in walking, not elsewhere classified: Secondary | ICD-10-CM | POA: Diagnosis present

## 2024-09-09 DIAGNOSIS — M6281 Muscle weakness (generalized): Secondary | ICD-10-CM | POA: Insufficient documentation

## 2024-09-09 DIAGNOSIS — H8113 Benign paroxysmal vertigo, bilateral: Secondary | ICD-10-CM | POA: Diagnosis present

## 2024-09-09 DIAGNOSIS — R42 Dizziness and giddiness: Secondary | ICD-10-CM | POA: Insufficient documentation

## 2024-09-09 NOTE — Therapy (Signed)
 " OUTPATIENT PHYSICAL THERAPY VESTIBULAR TREATMENT NOTE     Patient Name: Mary Malone MRN: [id number] DOB:February 04, 1946, 79 y.o., female Today's Date: 09/09/2024  END OF SESSION:  PT End of Session - 09/09/24 1229     Visit Number 10    Date for Recertification  10/02/24    Authorization Type [employer] ([employer] approved 09/07/2024 - 10/05/2024    Authorization - Visit Number 1    Authorization - Number of Visits 4    Progress Note Due on Visit 15    PT Start Time 1225    PT Stop Time 1310    PT Time Calculation (min) 45 min    Activity Tolerance Patient tolerated treatment well    Behavior During Therapy [facility] for tasks assessed/performed          Past Medical History:  Diagnosis Date   ALLERGIC RHINITIS    Allergy  05/04/1958   Plants, animals   Ataxia    Back pain    Cancer (HCC) 12/02/2020   basal cell of nose   Cataract 2014   Surgery both eyes   Dense breast    DEPRESSION    Fibroid    GERD    Hearing loss    History of radiation therapy 12/02/2020   for basal cell carcinoma of nose   HYPERLIPIDEMIA    diet control efforts   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   Osteoporosis 2018   PMB (postmenopausal bleeding) 12/2001   polyps   Serum calcium elevated 2021   sees Dr.Ellison/pulmonology with [facility]   Thyroid  disease    hypothyroidism   URINARY INCONTINENCE    OAB   Wisdom teeth removed 1866/1967   Past Surgical History:  Procedure Laterality Date   BREAST CYST ASPIRATION  12/05/2001   CATARACT EXTRACTION Bilateral 07/2012, 09/2012   DILATION AND CURETTAGE OF UTERUS  2003   EYE SURGERY  2014   Cataracts   HYSTEROSCOPY  01/2002   resection, sm fibroid   Left knee surgey     MENISCUS REPAIR     left knee 2010   REFRACTIVE SURGERY  02/2016   TYMPANOSTOMY TUBE PLACEMENT Right 06/18/2024   WISDOM TOOTH EXTRACTION     Patient Active Problem List   Diagnosis Date Noted   Dizziness 06/15/2024   Right chronic serous otitis media 06/15/2024   Impacted  cerumen of right ear 06/15/2024   Double vision 12/13/2023   Sensorineural hearing loss, 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   Chronic rhinitis 06/10/2023   Other specified disorders of eustachian tube, right ear 06/10/2023   Thyroid  nodule 09/14/2022   Dyspnea on exertion 04/27/2022   Cervical radiculopathy 02/03/2021   Basal cell carcinoma (BCC) of lateral side wall of nose 11/09/2020   Hypercalcemia 04/20/2020   Numbness 04/19/2020   Left leg pain 07/08/2018   Sacral back pain 04/16/2018   Osteoporosis 08/09/2016   White matter abnormality on MRI of brain 07/11/2015   MCI (mild cognitive impairment) 07/11/2015   Routine general medical examination at a health care facility 02/11/2015   Anxiety and depression 11/01/2010   OAB (overactive bladder)    GERD 09/23/2007    PCP: Rollene Almarie LABOR, MD REFERRING PROVIDER: Karis Clunes, MD  REFERRING DIAG: R42 (ICD-10-CM) - Dizziness  THERAPY DIAG:  Dizziness and giddiness  Other abnormalities of gait and mobility  Muscle weakness (generalized)  Difficulty in walking, not elsewhere classified  BPPV (benign paroxysmal positional vertigo), bilateral  ONSET DATE: June 03, 2024  Rationale for Evaluation and  Treatment: Rehabilitation  SUBJECTIVE:   SUBJECTIVE STATEMENT: Patient reports that her ataxia is having trouble today.  Reports some dizziness.  States that her headache is the same of 2/10.  Pt accompanied by: self  PERTINENT HISTORY:  Ataxia, Cerebellar atrophy, macular degeneration, diplopia, right ear eustachian tube dysfunction (scheduled for tympanostomy tube placement), osteopenia  PAIN:  Are you having pain? Yes: NPRS scale: 2/10 Pain location: headache Pain description: ache Aggravating factors: rotation Relieving factors: rest  PRECAUTIONS: Fall  RED FLAGS: None   WEIGHT BEARING RESTRICTIONS: No  FALLS: Has patient fallen in last 6 months? No  LIVING ENVIRONMENT: Lives with: lives alone Lives in:  House/apartment Stairs: single level condo in [place] Has following equipment at home: Single point cane and Grab bars  PLOF: Independent and Leisure: walking with friends in community  PATIENT GOALS: To be able to return to walking with her friends in the community and decrease dizziness.  OBJECTIVE:  Note: Objective measures were completed at Evaluation unless otherwise noted.  DIAGNOSTIC FINDINGS:  Brain MRI on 03/15/2023: IMPRESSION: Unremarkable MRI of the head and orbits for patient age.  COGNITION: Overall cognitive status: Within functional limits for tasks assessed    POSTURE:  rounded shoulders and forward head  Cervical ROM:    Active A/ROM (deg) eval A/ROM (Deg) 07/22/24  Flexion 40 53  Extension 35 37  Right lateral flexion 25 25  Left lateral flexion 25 25  Right rotation 38 38  Left rotation 50 60  (Blank rows = not tested)  STRENGTH:  Eval: Bilateral hip strength grossly 3+/5 Bilateral quad strength of 5-/5 Bilateral shoulder strength 4-/5  07/22/2024: Bilateral hip strength of 3+ to 4-/5 grossly throughout Right quad strength is 5/5, left quad is 5-/5 Bilateral shoulder strength 4-/5    GAIT: Gait pattern: ataxic Distance walked: >500 ft Assistive device utilized: Single point cane Level of assistance: Modified independence Comments: slower gait velocity  FUNCTIONAL TESTS:  Eval: 5 times sit to stand: 19.44 sec with UE use Timed up and go (TUG): 19.03 sec with [facility]  06/24/2024: 6 minute walk test:  900 ft (age related norms are 1,393 ft)  07/22/2024: 5 times sit to stand: 12.24 sec with UE use 6 minute walk test:  966 ft  09/02/2024: 5 times sit to stand: 11.82 sec with UE use 6 minute walk test:  1,043 ft   PATIENT SURVEYS:  Eval: Dizziness Handicapped Inventory:  Total Score: 42 / 100  08/05/2024:  DHI Total Score: 36 / 100  09/02/2024:  Dizziness Handicapped Inventory:  Total Score: 22/100  VESTIBULAR  ASSESSMENT:    SYMPTOM BEHAVIOR:  Subjective history: Started on June 03, 2024 with severe dizziness  Non-Vestibular symptoms: changes in hearing, diplopia, neck pain, and tinnitus  Type of dizziness: Diplopia and Spinning/Vertigo  Frequency: daily  Duration: constant  Aggravating factors: Induced by position change: rolling to the right and Induced by motion: turning head quickly  Relieving factors: head stationary and rest  Progression of symptoms: unchanged  OCULOMOTOR EXAM:  Ocular Alignment: normal  Ocular ROM: No Limitations  Spontaneous Nystagmus: absent  Gaze-Induced Nystagmus: absent  Smooth Pursuits: intact with horizontal movements, but saccades noted with vertical movements  Saccades: dysmetria     VESTIBULAR - OCULAR REFLEX:     Head-Impulse Test: HIT Right: positive HIT Left: positive     POSITIONAL TESTING: Right Dix-Hallpike: upbeating, right nystagmus Left Dix-Hallpike: slow moving nystagmus noted    OTHOSTATICS: not done  TREATMENT DATE:   09/09/2024: Nustep level 5 x6 min with PT present to discuss status Seated with 3# ankle weight:  LAQ, marching, and heel/toe raises.  2x10 each bilat Standing heel raises at barre.  X10 Standing toe taps onto 6 step with unilateral UE support of barre.  2X10 bilat Standing on foam pad performing head turns with UE support of barre.  2x10 Standing on foam pad performing hip abduction 2x10 bilat Side stepping at barre down and back x2 laps Sit to/from stand x5 with focusing on one point (pt reported less dizziness) Standing shoulder ER with red tband 2x12 Practice ambulation with sharp turns to simulate where patient loses balance at home, but she admits that it is not the same as at home, as she is walking at a slower pace. Standing rows with red tband 2x12   09/02/2024: Nustep level 5 x5  min with PT present to discuss status Sit to/from stand x5 6 minute walk test:  1,043 ft Dizziness Handicapped Inventory:  Total Score: 22/100 Dix Hallpike negative on the right. Dix Hallpike positive on the left.  Proceeded with canalith repositioning with the Epley for the left side   08/18/2024: Nustep level 5 x5 min with PT present to discuss status Dix Hallpike negative on the right. Dix Hallpike positive on the left.  Proceeded with canalith repositioning with the Epley for the left side. Seated with 3# ankle weight:  LAQ, marching, and heel/toe raises.  2x10 each bilat Leg Press (seat at 6) 60# 2x10 Standing rocker board for DF/PF x1 min Standing balance with rocker board in lateral position x1 min   08/12/2024: Nustep level 5 x6 min with PT present to discuss status Seated with 3# ankle weight:  LAQ, marching, and heel/toe raises.  2x10 each bilat Leg Press (seat at 6) 60# 2x10 Standing on foam pad performing slow horizontal head rotations with CGA 2x1 min Standing on foam pad performing hip abduction x10 bilat Dix Hallpike positive on the right, proceeded with Epley Maneuver for right canalith repositioning x1   PATIENT EDUCATION: Education details: Issued HEP Person educated: Patient Education method: Programmer, Multimedia, Facilities Manager, and Handouts Education comprehension: verbalized understanding and returned demonstration  HOME EXERCISE PROGRAM: Access Code: [facility] URL: [email or web address] Date: 06/24/2024 Prepared by: Jarrell Tavionna Grout  Exercises - Seated Cervical Rotation AROM  - 1 x daily - 7 x weekly - 2 sets - 10 reps - Seated Scapular Retraction  - 1 x daily - 7 x weekly - 2 sets - 10 reps - Sit to Stand  - 1 x daily - 7 x weekly - 2 sets - 10 reps - Standing Marching  - 1 x daily - 7 x weekly - 2 sets - 10 reps - Seated Horizontal Smooth Pursuit  - 1 x daily - 7 x weekly - 2 sets - 10 reps - Seated Vertical Smooth Pursuit  - 1 x daily - 7 x  weekly - 2 sets - 10 reps - Seated Proximal-Distal Smooth Pursuit  - 1 x daily - 7 x weekly - 2 sets - 10 reps - Brandt-Daroff Vestibular Exercise  - 1 x daily - 7 x weekly - 3-5 reps  GOALS: Goals reviewed with patient? Yes  SHORT TERM GOALS: Target date: 07/10/2024  Patient will be compliant with initial HEP. Baseline: Goal status: MET  2.  Patient will participate in a 6 min walk test to establish a baseline measurement. Baseline:  Goal status: MET on 06/24/24  3.  Patient will  report a 30% improvement in dizziness. Baseline:  Goal status: MET   LONG TERM GOALS: Target date: 10/03/2024  Patient will be independent with advanced HEP to allow for self progression after discharge. Baseline:  Goal status: IN PROGRESS  2.  Patient will improve Dizziness Handicapped Inventory to no greater than 15% to demonstrate improvement in functional tasks. Baseline: 42/100 Goal status: IN PROGRESS  3.  Patient will report ability to walk with her community friends for >15 minutes with keeping up pace and 50% less fatigue. Baseline: reports slower gait and increased dyspnea Goal status: IN PROGRESS  4.  Patient will increase her UE strength to [facility] to allow her to pick up her cat and place it in the carrier. Baseline:  Goal status: IN PROGRESS  5.  Patient will increase LE strength to [facility] to allow her to return to gym at [facility] and perform weight machines. Baseline:  Goal status: IN PROGRESS  6.  Patient will increase cervical ROM to [facility] to allow her to look both directions to more easily cross the road and less pain with driving. Baseline:  Goal status: IN PROGRESS (see above)  ASSESSMENT:  CLINICAL IMPRESSION:  Ms Schaafsma presents to skilled PT reporting that she has unsteadiness, ataxia, and dizziness mentioning they were increased today. She is progressing through strengthening and balance, however reports feeling unsteady and dizzy through weight shifting exercise.  Patient was cued to find an unmoving object in front of her to spot when performing sit-to-stands, and patient reported that helping her dizziness feeling. Patient also required cueing to perform hip abduction with proper body mechanics. Patient continues to require skilled PT to progress towards goal related activities and improved balance and decreased dizziness as well as improved functional strengthening.   OBJECTIVE IMPAIRMENTS: Abnormal gait, decreased balance, difficulty walking, decreased ROM, decreased strength, dizziness, impaired perceived functional ability, increased muscle spasms, impaired flexibility, postural dysfunction, and pain.   ACTIVITY LIMITATIONS: bending, standing, squatting, bed mobility, and locomotion level  PARTICIPATION LIMITATIONS: cleaning, laundry, driving, and community activity  PERSONAL FACTORS: Past/current experiences, Time since onset of injury/illness/exacerbation, and 3+ comorbidities: Osteopenia, Sensorinerual hearing loss, macular degeneration are also affecting patient's functional outcome.   REHAB POTENTIAL: Good  CLINICAL DECISION MAKING: Evolving/moderate complexity  EVALUATION COMPLEXITY: Moderate   PLAN:  PT FREQUENCY: 1-2x/week  PT DURATION: 8 weeks  PLANNED INTERVENTIONS: 97164- PT Re-evaluation, 97750- Physical Performance Testing, 97110-Therapeutic exercises, 97530- Therapeutic activity, 97112- Neuromuscular re-education, 97535- Self Care, [id number]- Manual therapy, [phone]- Gait training, [phone]- Canalith repositioning, [id number]- Aquatic Therapy, [phone]- Electrical stimulation (unattended), [phone]- Electrical stimulation (manual), [id number]- Vasopneumatic device, [id number]- Ultrasound, [id number]- Traction (mechanical), [id number]- Ionotophoresis 4mg /ml Dexamethasone, [id number] (1-2 muscles), [id number] (3+ muscles)- Dry Needling, Patient/Family education, Balance training, Stair training, Taping, Joint mobilization, Joint manipulation, Spinal manipulation, Spinal mobilization,  Vestibular training, Cryotherapy, and Moist heat  PLAN FOR NEXT SESSION: Assess and progress HEP as indicated, strengthening, flexibility, vestibular rehab   Jarrell Laming, PT, DPT 09/09/2024, 1:24 PM  [facility] [address], [address] [place], [place] [address] Phone # [phone] Fax [phone]   "

## 2024-09-16 ENCOUNTER — Encounter: Payer: Self-pay | Admitting: Rehabilitative and Restorative Service Providers"

## 2024-09-16 ENCOUNTER — Ambulatory Visit: Admitting: Rehabilitative and Restorative Service Providers"

## 2024-09-16 ENCOUNTER — Other Ambulatory Visit (INDEPENDENT_AMBULATORY_CARE_PROVIDER_SITE_OTHER): Payer: Self-pay | Admitting: Otolaryngology

## 2024-09-16 DIAGNOSIS — R2689 Other abnormalities of gait and mobility: Secondary | ICD-10-CM

## 2024-09-16 DIAGNOSIS — H903 Sensorineural hearing loss, bilateral: Secondary | ICD-10-CM

## 2024-09-16 DIAGNOSIS — H8113 Benign paroxysmal vertigo, bilateral: Secondary | ICD-10-CM

## 2024-09-16 DIAGNOSIS — R262 Difficulty in walking, not elsewhere classified: Secondary | ICD-10-CM

## 2024-09-16 DIAGNOSIS — R42 Dizziness and giddiness: Secondary | ICD-10-CM

## 2024-09-16 DIAGNOSIS — M6281 Muscle weakness (generalized): Secondary | ICD-10-CM

## 2024-09-16 NOTE — Therapy (Signed)
 " OUTPATIENT PHYSICAL THERAPY VESTIBULAR TREATMENT NOTE     Patient Name: Mary Malone MRN: [id number] DOB:1945/12/25, 79 y.o., female Today's Date: 09/16/2024  END OF SESSION:  PT End of Session - 09/16/24 1321     Visit Number 11    Date for Recertification  10/02/24    Authorization Type [employer] Medicare ([employer] approved 09/07/2024 - 10/05/2024    Authorization - Visit Number 2    Authorization - Number of Visits 4    Progress Note Due on Visit 15    PT Start Time 1225    PT Stop Time 1314    PT Time Calculation (min) 49 min    Activity Tolerance Patient tolerated treatment well    Behavior During Therapy [facility] for tasks assessed/performed           Past Medical History:  Diagnosis Date   ALLERGIC RHINITIS    Allergy  05/04/1958   Plants, animals   Ataxia    Back pain    Cancer (HCC) 12/02/2020   basal cell of nose   Cataract 2014   Surgery both eyes   Dense breast    DEPRESSION    Fibroid    GERD    Hearing loss    History of radiation therapy 12/02/2020   for basal cell carcinoma of nose   HYPERLIPIDEMIA    diet control efforts   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   Osteoporosis 2018   PMB (postmenopausal bleeding) 12/2001   polyps   Serum calcium elevated 2021   sees Dr.Ellison/pulmonology with [facility]   Thyroid  disease    hypothyroidism   URINARY INCONTINENCE    OAB   Wisdom teeth removed 1866/1967   Past Surgical History:  Procedure Laterality Date   BREAST CYST ASPIRATION  12/05/2001   CATARACT EXTRACTION Bilateral 07/2012, 09/2012   DILATION AND CURETTAGE OF UTERUS  2003   EYE SURGERY  2014   Cataracts   HYSTEROSCOPY  01/2002   resection, sm fibroid   Left knee surgey     MENISCUS REPAIR     left knee 2010   REFRACTIVE SURGERY  02/2016   TYMPANOSTOMY TUBE PLACEMENT Right 06/18/2024   WISDOM TOOTH EXTRACTION     Patient Active Problem List   Diagnosis Date Noted   Dizziness 06/15/2024   Right chronic serous otitis media 06/15/2024    Impacted cerumen of right ear 06/15/2024   Double vision 12/13/2023   Sensorineural hearing loss, 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   Chronic rhinitis 06/10/2023   Other specified disorders of eustachian tube, right ear 06/10/2023   Thyroid  nodule 09/14/2022   Dyspnea on exertion 04/27/2022   Cervical radiculopathy 02/03/2021   Basal cell carcinoma (BCC) of lateral side wall of nose 11/09/2020   Hypercalcemia 04/20/2020   Numbness 04/19/2020   Left leg pain 07/08/2018   Sacral back pain 04/16/2018   Osteoporosis 08/09/2016   White matter abnormality on MRI of brain 07/11/2015   MCI (mild cognitive impairment) 07/11/2015   Routine general medical examination at a health care facility 02/11/2015   Anxiety and depression 11/01/2010   OAB (overactive bladder)    GERD 09/23/2007    PCP: Rollene Almarie LABOR, MD REFERRING PROVIDER: Karis Clunes, MD  REFERRING DIAG: R42 (ICD-10-CM) - Dizziness  THERAPY DIAG:  Dizziness and giddiness  Other abnormalities of gait and mobility  Muscle weakness (generalized)  Difficulty in walking, not elsewhere classified  BPPV (benign paroxysmal positional vertigo), bilateral  ONSET DATE: June 03, 2024  Rationale for Evaluation  and Treatment: Rehabilitation  SUBJECTIVE:   SUBJECTIVE STATEMENT: Patient reports headache at 1/10 pain today, reports that the dizziness is a little bit better than last session, but still experiencing it and feels like her stress is causing her dizziness.   Pt accompanied by: self  PERTINENT HISTORY:  Ataxia, Cerebellar atrophy, macular degeneration, diplopia, right ear eustachian tube dysfunction (scheduled for tympanostomy tube placement), osteopenia  PAIN:  Are you having pain? Yes: NPRS scale: 1/10 Pain location: headache Pain description: ache Aggravating factors: rotation Relieving factors: rest  PRECAUTIONS: Fall  RED FLAGS: None   WEIGHT BEARING RESTRICTIONS: No  FALLS: Has patient fallen in last 6 months?  No  LIVING ENVIRONMENT: Lives with: lives alone Lives in: House/apartment Stairs: single level condo in [place] Has following equipment at home: Single point cane and Grab bars  PLOF: Independent and Leisure: walking with friends in community  PATIENT GOALS: To be able to return to walking with her friends in the community and decrease dizziness.  OBJECTIVE:  Note: Objective measures were completed at Evaluation unless otherwise noted.  DIAGNOSTIC FINDINGS:  Brain MRI on 03/15/2023: IMPRESSION: Unremarkable MRI of the head and orbits for patient age.  COGNITION: Overall cognitive status: Within functional limits for tasks assessed    POSTURE:  rounded shoulders and forward head  Cervical ROM:    Active A/ROM (deg) eval A/ROM (Deg) 07/22/24  Flexion 40 53  Extension 35 37  Right lateral flexion 25 25  Left lateral flexion 25 25  Right rotation 38 38  Left rotation 50 60  (Blank rows = not tested)  STRENGTH:  Eval: Bilateral hip strength grossly 3+/5 Bilateral quad strength of 5-/5 Bilateral shoulder strength 4-/5  07/22/2024: Bilateral hip strength of 3+ to 4-/5 grossly throughout Right quad strength is 5/5, left quad is 5-/5 Bilateral shoulder strength 4-/5    GAIT: Gait pattern: ataxic Distance walked: >500 ft Assistive device utilized: Single point cane Level of assistance: Modified independence Comments: slower gait velocity  FUNCTIONAL TESTS:  Eval: 5 times sit to stand: 19.44 sec with UE use Timed up and go (TUG): 19.03 sec with [facility]  06/24/2024: 6 minute walk test:  900 ft (age related norms are 1,393 ft)  07/22/2024: 5 times sit to stand: 12.24 sec with UE use 6 minute walk test:  966 ft  09/02/2024: 5 times sit to stand: 11.82 sec with UE use 6 minute walk test:  1,043 ft   PATIENT SURVEYS:  Eval: Dizziness Handicapped Inventory:  Total Score: 42 / 100  08/05/2024:  DHI Total Score: 36 / 100  09/02/2024:  Dizziness  Handicapped Inventory:  Total Score: 22/100  VESTIBULAR ASSESSMENT:    SYMPTOM BEHAVIOR:  Subjective history: Started on June 03, 2024 with severe dizziness  Non-Vestibular symptoms: changes in hearing, diplopia, neck pain, and tinnitus  Type of dizziness: Diplopia and Spinning/Vertigo  Frequency: daily  Duration: constant  Aggravating factors: Induced by position change: rolling to the right and Induced by motion: turning head quickly  Relieving factors: head stationary and rest  Progression of symptoms: unchanged  OCULOMOTOR EXAM:  Ocular Alignment: normal  Ocular ROM: No Limitations  Spontaneous Nystagmus: absent  Gaze-Induced Nystagmus: absent  Smooth Pursuits: intact with horizontal movements, but saccades noted with vertical movements  Saccades: dysmetria     VESTIBULAR - OCULAR REFLEX:     Head-Impulse Test: HIT Right: positive HIT Left: positive     POSITIONAL TESTING: Right Dix-Hallpike: upbeating, right nystagmus Left Dix-Hallpike: slow moving nystagmus noted  OTHOSTATICS: not done                                                                                                                             TREATMENT DATE:   09/16/2024: Nustep level 4 x6 min with PT present to discuss status Seated with 3# ankle weight:  LAQ, marching  2x10 each bilat (cueing not control foot on marching) Heel/toe raises at barre 2x10 Standing toe taps onto 6 step with unilateral UE support of barre.  2X10 bilat  Standing on foam pad performing head turns right and left with UE support of barre.  2x10 Standing on foam pad performing head up and down with UE support of barre.  2x10 Side stepping at barre down and back with blue band loop x3 laps Sit to/from stand 2x5 with focusing on one point with blue band loop around knees Standing rows with red tband 2x12 Standing shoulder ER with red tband x12 Leg Press (seat at 6) 60# x20   09/09/2024: Nustep level 5 x6 min with PT  present to discuss status Seated with 3# ankle weight:  LAQ, marching, and heel/toe raises.  2x10 each bilat Standing heel raises at barre.  X10 Standing toe taps onto 6 step with unilateral UE support of barre.  2X10 bilat Standing on foam pad performing head turns with UE support of barre.  2x10 Standing on foam pad performing hip abduction 2x10 bilat Side stepping at barre down and back x2 laps Sit to/from stand x5 with focusing on one point (pt reported less dizziness) Standing shoulder ER with red tband 2x12 Practice ambulation with sharp turns to simulate where patient loses balance at home, but she admits that it is not the same as at home, as she is walking at a slower pace. Standing rows with red tband 2x12   09/02/2024: Nustep level 5 x5 min with PT present to discuss status Sit to/from stand x5 6 minute walk test:  1,043 ft Dizziness Handicapped Inventory:  Total Score: 22/100 Dix Hallpike negative on the right. Dix Hallpike positive on the left.  Proceeded with canalith repositioning with the Epley for the left side   PATIENT EDUCATION: Education details: Issued HEP Person educated: Patient Education method: Explanation, Demonstration, and Handouts Education comprehension: verbalized understanding and returned demonstration  HOME EXERCISE PROGRAM: Access Code: [facility] URL: [email or web address] Date: 06/24/2024 Prepared by: Jarrell Menke  Exercises - Seated Cervical Rotation AROM  - 1 x daily - 7 x weekly - 2 sets - 10 reps - Seated Scapular Retraction  - 1 x daily - 7 x weekly - 2 sets - 10 reps - Sit to Stand  - 1 x daily - 7 x weekly - 2 sets - 10 reps - Standing Marching  - 1 x daily - 7 x weekly - 2 sets - 10 reps - Seated Horizontal Smooth Pursuit  - 1 x daily - 7 x weekly - 2 sets - 10 reps -  Seated Vertical Smooth Pursuit  - 1 x daily - 7 x weekly - 2 sets - 10 reps - Seated Proximal-Distal Smooth Pursuit  - 1 x daily - 7 x weekly - 2 sets  - 10 reps - Brandt-Daroff Vestibular Exercise  - 1 x daily - 7 x weekly - 3-5 reps  GOALS: Goals reviewed with patient? Yes  SHORT TERM GOALS: Target date: 07/10/2024  Patient will be compliant with initial HEP. Baseline: Goal status: MET  2.  Patient will participate in a 6 min walk test to establish a baseline measurement. Baseline:  Goal status: MET on 06/24/24  3.  Patient will report a 30% improvement in dizziness. Baseline:  Goal status: MET   LONG TERM GOALS: Target date: 10/03/2024  Patient will be independent with advanced HEP to allow for self progression after discharge. Baseline:  Goal status: IN PROGRESS  2.  Patient will improve Dizziness Handicapped Inventory to no greater than 15% to demonstrate improvement in functional tasks. Baseline: 42/100 Goal status: IN PROGRESS  3.  Patient will report ability to walk with her community friends for >15 minutes with keeping up pace and 50% less fatigue. Baseline: reports slower gait and increased dyspnea Goal status: IN PROGRESS  4.  Patient will increase her UE strength to [facility] to allow her to pick up her cat and place it in the carrier. Baseline:  Goal status: IN PROGRESS  5.  Patient will increase LE strength to [facility] to allow her to return to gym at [facility] and perform weight machines. Baseline:  Goal status: IN PROGRESS  6.  Patient will increase cervical ROM to [facility] to allow her to look both directions to more easily cross the road and less pain with driving. Baseline:  Goal status: IN PROGRESS (see above)  ASSESSMENT:  CLINICAL IMPRESSION:  Ms Raspberry presents to PT reporting 1/10 headache pain, but slight decrease in dizziness since last session. She is progressing through strengthening and balance and had less reports of dizziness through session. She progressed strengthening abduction to using the blue band loop, which she tolerated well with come cueing to keep feet slightly apart when sidestepping.  She has some difficulty clearing her feet when performing toe taps, cued to lift hip flexion higher and improved. Patient was reminded through session to find a spot to watch on the wall to mange dizziness, she states she has only done this minimally at home. Patient continues to require skilled PT to progress towards improved balance and decreased dizziness as well as improved functional strengthening.   OBJECTIVE IMPAIRMENTS: Abnormal gait, decreased balance, difficulty walking, decreased ROM, decreased strength, dizziness, impaired perceived functional ability, increased muscle spasms, impaired flexibility, postural dysfunction, and pain.   ACTIVITY LIMITATIONS: bending, standing, squatting, bed mobility, and locomotion level  PARTICIPATION LIMITATIONS: cleaning, laundry, driving, and community activity  PERSONAL FACTORS: Past/current experiences, Time since onset of injury/illness/exacerbation, and 3+ comorbidities: Osteopenia, Sensorinerual hearing loss, macular degeneration are also affecting patient's functional outcome.   REHAB POTENTIAL: Good  CLINICAL DECISION MAKING: Evolving/moderate complexity  EVALUATION COMPLEXITY: Moderate   PLAN:  PT FREQUENCY: 1-2x/week  PT DURATION: 8 weeks  PLANNED INTERVENTIONS: 97164- PT Re-evaluation, 97750- Physical Performance Testing, 97110-Therapeutic exercises, 97530- Therapeutic activity, [id number]- Neuromuscular re-education, 97535- Self Care, [id number]- Manual therapy, [id number]- Gait training, [phone]- Canalith repositioning, [id number]- Aquatic Therapy, H9716- Electrical stimulation (unattended), [id number]- Electrical stimulation (manual), [id number]- Vasopneumatic device, [id number]- Ultrasound, [id number]- Traction (mechanical), [id number]- Ionotophoresis 4mg /ml Dexamethasone, 79439 (1-2 muscles), 20561 (3+ muscles)- Dry Needling,  Patient/Family education, Balance training, Stair training, Taping, Joint mobilization, Joint manipulation, Spinal manipulation, Spinal mobilization,  Vestibular training, Cryotherapy, and Moist heat  PLAN FOR NEXT SESSION: Assess and progress HEP as indicated, strengthening, flexibility, vestibular rehab   Arleene Harsh, SPT 09/16/2024, 1:24 PM     I agree with the following treatment note after reviewing documentation. This session was performed under the supervision of a licensed clinician.  Jarrell Laming, PT, DPT 09/16/2024, 1:24 PM  [facility] [address], [address] [place], [place] [address] Phone # [phone] Fax [phone]   "

## 2024-09-18 ENCOUNTER — Ambulatory Visit (INDEPENDENT_AMBULATORY_CARE_PROVIDER_SITE_OTHER): Admitting: Audiology

## 2024-09-18 DIAGNOSIS — H903 Sensorineural hearing loss, bilateral: Secondary | ICD-10-CM

## 2024-09-18 NOTE — Progress Notes (Signed)
" °  [address], [address] [place], [place] [address] [phone]  Audiological Evaluation    Name: Mary Malone     DOB:   [birth year]      MRN:   [id number]                                                                                     Service Date: 09/18/2024     Accompanied by: self    Patient comes today after Dr. Karis, ENT sent a referral for a hearing evaluation due to concerns with post operatory hearing status after a right ear pressure equalization tubes was placed.   Symptoms Yes Details  Hearing loss  [x]  More loss in the right ear  Tinnitus  []    Ear pain/ infections/pressure  [x]  Still feels pressure in both ears  Balance problems  [x]  Reports has ataxia, goes physical therapy vestibular rehab at [facility]  Noise exposure history  []    Previous ear surgeries  [x]  Had has tubes placed in the right ear. Per chart review the last one was placed by Dr. Karis around October 2025   Family history of hearing loss  []    Amplification  [x]  Bilateral hearing aids from [facility]  Other  []      Otoscopy: Right ear: Clear external ear canal and pressure equalization tube was visualized. Left ear:  Clear external ear canal and notable landmarks visualized on the tympanic membrane.  Tympanometry: Right ear: Type B - Large external ear canal volume with no middle ear pressure peak or tympanic membrane compliance. Findings are consistent with a tympanic membrane perforation or presence of a pressure equalization (PE) tube. Left ear: Type A - Normal external ear canal volume with normal middle ear pressure and normal tympanic membrane compliance. Findings are consistent with normal middle ear function.  Hearing Evaluation The hearing test results were completed under headphones and results are deemed to be of good reliability. Test technique:  conventional    Pure tone Audiometry: Right ear- mild sloping to moderately-severe sensorineural hearing loss from 250 Hz - 8000  Hz. Left ear-  mild sloping to moderately-severe sensorineural hearing loss from 250 Hz - 8000 Hz.  Speech Audiometry: Right ear- Speech Reception Threshold (SRT) was obtained at 35 dBHL. Left ear-Speech Reception Threshold (SRT) was obtained at 30 dBHL.   Word Recognition Score Tested using NU-6 (recorded) Right ear: 96% was obtained at a presentation level of 75 dBHL with contralateral masking which is deemed as  excellent. Left ear: 92% was obtained at a presentation level of 75 dBHL with contralateral masking which is deemed as  excellent.   Impression: There is a slight difference in pure-tone thresholds from 2-3k Hz, worse in the right ear. There is a no significant difference in the word recognition score in between ears.  Today's audiogram is essentially the same when compared to a previous one completed at [facility] hearing in 2022.   Recommendations: Follow up with ENT as scheduled. Return for a hearing evaluation if concerns with hearing changes arise or per MD recommendation.   Meli Faley MARIE LEROUX-MARTINEZ, AUD  "

## 2024-09-23 ENCOUNTER — Ambulatory Visit: Admitting: Rehabilitative and Restorative Service Providers"

## 2024-09-23 ENCOUNTER — Encounter: Payer: Self-pay | Admitting: Rehabilitative and Restorative Service Providers"

## 2024-09-23 DIAGNOSIS — R42 Dizziness and giddiness: Secondary | ICD-10-CM | POA: Diagnosis not present

## 2024-09-23 DIAGNOSIS — M6281 Muscle weakness (generalized): Secondary | ICD-10-CM

## 2024-09-23 DIAGNOSIS — H8113 Benign paroxysmal vertigo, bilateral: Secondary | ICD-10-CM

## 2024-09-23 DIAGNOSIS — R262 Difficulty in walking, not elsewhere classified: Secondary | ICD-10-CM

## 2024-09-23 DIAGNOSIS — R2689 Other abnormalities of gait and mobility: Secondary | ICD-10-CM

## 2024-09-23 NOTE — Therapy (Signed)
 " OUTPATIENT PHYSICAL THERAPY VESTIBULAR TREATMENT NOTE     Patient Name: Mary Malone MRN: [id number] DOB:1946-04-17, 79 y.o., female Today's Date: 09/23/2024  END OF SESSION:  PT End of Session - 09/23/24 1148     Visit Number 12    Date for Recertification  10/02/24    Authorization Type [employer] ([employer] approved 09/07/2024 - 10/05/2024    Authorization - Number of Visits 4    Progress Note Due on Visit 15    PT Start Time 1059    PT Stop Time 1142    PT Time Calculation (min) 43 min    Activity Tolerance Patient tolerated treatment well    Behavior During Therapy [facility] for tasks assessed/performed            Past Medical History:  Diagnosis Date   ALLERGIC RHINITIS    Allergy  05/04/1958   Plants, animals   Ataxia    Back pain    Cancer (HCC) 12/02/2020   basal cell of nose   Cataract 2014   Surgery both eyes   Dense breast    DEPRESSION    Fibroid    GERD    Hearing loss    History of radiation therapy 12/02/2020   for basal cell carcinoma of nose   HYPERLIPIDEMIA    diet control efforts   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   Osteoporosis 2018   PMB (postmenopausal bleeding) 12/2001   polyps   Serum calcium elevated 2021   sees Dr.Ellison/pulmonology with [place]   Thyroid  disease    hypothyroidism   URINARY INCONTINENCE    OAB   Wisdom teeth removed 1866/1967   Past Surgical History:  Procedure Laterality Date   BREAST CYST ASPIRATION  12/05/2001   CATARACT EXTRACTION Bilateral 07/2012, 09/2012   DILATION AND CURETTAGE OF UTERUS  2003   EYE SURGERY  2014   Cataracts   HYSTEROSCOPY  01/2002   resection, sm fibroid   Left knee surgey     MENISCUS REPAIR     left knee 2010   REFRACTIVE SURGERY  02/2016   TYMPANOSTOMY TUBE PLACEMENT Right 06/18/2024   WISDOM TOOTH EXTRACTION     Patient Active Problem List   Diagnosis Date Noted   Dizziness 06/15/2024   Right chronic serous otitis media 06/15/2024   Impacted cerumen of right ear  06/15/2024   Double vision 12/13/2023   Sensorineural hearing loss, 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   Chronic rhinitis 06/10/2023   Other specified disorders of eustachian tube, right ear 06/10/2023   Thyroid  nodule 09/14/2022   Dyspnea on exertion 04/27/2022   Cervical radiculopathy 02/03/2021   Basal cell carcinoma (BCC) of lateral side wall of nose 11/09/2020   Hypercalcemia 04/20/2020   Numbness 04/19/2020   Left leg pain 07/08/2018   Sacral back pain 04/16/2018   Osteoporosis 08/09/2016   White matter abnormality on MRI of brain 07/11/2015   MCI (mild cognitive impairment) 07/11/2015   Routine general medical examination at a health care facility 02/11/2015   Anxiety and depression 11/01/2010   OAB (overactive bladder)    GERD 09/23/2007    PCP: Rollene Almarie LABOR, MD REFERRING PROVIDER: Karis Clunes, MD  REFERRING DIAG: R42 (ICD-10-CM) - Dizziness  THERAPY DIAG:  Dizziness and giddiness  Other abnormalities of gait and mobility  Muscle weakness (generalized)  BPPV (benign paroxysmal positional vertigo), bilateral  Difficulty in walking, not elsewhere classified  ONSET DATE: June 03, 2024  Rationale for Evaluation and Treatment: Rehabilitation  SUBJECTIVE:  SUBJECTIVE STATEMENT: Patient reports she is feeling the usual pain at a 2/10 today. She reports her her right ear is giving her some troubles causing some dizziness today and she would like to do an epley.  Pt accompanied by: self  PERTINENT HISTORY:  Ataxia, Cerebellar atrophy, macular degeneration, diplopia, right ear eustachian tube dysfunction (scheduled for tympanostomy tube placement), osteopenia  PAIN:  Are you having pain? Yes: NPRS scale: 2/10 Pain location: headache Pain description: ache Aggravating factors: rotation Relieving factors: rest  PRECAUTIONS: Fall  RED FLAGS: None   WEIGHT BEARING RESTRICTIONS: No  FALLS: Has patient fallen in last 6 months? No  LIVING ENVIRONMENT: Lives  with: lives alone Lives in: House/apartment Stairs: single level condo in [place] Has following equipment at home: Single point cane and Grab bars  PLOF: Independent and Leisure: walking with friends in community  PATIENT GOALS: To be able to return to walking with her friends in the community and decrease dizziness.  OBJECTIVE:  Note: Objective measures were completed at Evaluation unless otherwise noted.  DIAGNOSTIC FINDINGS:  Brain MRI on 03/15/2023: IMPRESSION: Unremarkable MRI of the head and orbits for patient age.  COGNITION: Overall cognitive status: Within functional limits for tasks assessed    POSTURE:  rounded shoulders and forward head  Cervical ROM:    Active A/ROM (deg) eval A/ROM (Deg) 07/22/24  Flexion 40 53  Extension 35 37  Right lateral flexion 25 25  Left lateral flexion 25 25  Right rotation 38 38  Left rotation 50 60  (Blank rows = not tested)  STRENGTH:  Eval: Bilateral hip strength grossly 3+/5 Bilateral quad strength of 5-/5 Bilateral shoulder strength 4-/5  07/22/2024: Bilateral hip strength of 3+ to 4-/5 grossly throughout Right quad strength is 5/5, left quad is 5-/5 Bilateral shoulder strength 4-/5    GAIT: Gait pattern: ataxic Distance walked: >500 ft Assistive device utilized: Single point cane Level of assistance: Modified independence Comments: slower gait velocity  FUNCTIONAL TESTS:  Eval: 5 times sit to stand: 19.44 sec with UE use Timed up and go (TUG): 19.03 sec with [facility]  06/24/2024: 6 minute walk test:  900 ft (age related norms are 1,393 ft)  07/22/2024: 5 times sit to stand: 12.24 sec with UE use 6 minute walk test:  966 ft  09/02/2024: 5 times sit to stand: 11.82 sec with UE use 6 minute walk test:  1,043 ft   PATIENT SURVEYS:  Eval: Dizziness Handicapped Inventory:  Total Score: 42 / 100  08/05/2024:  DHI Total Score: 36 / 100  09/02/2024:  Dizziness Handicapped Inventory:  Total Score:  22/100  VESTIBULAR ASSESSMENT:    SYMPTOM BEHAVIOR:  Subjective history: Started on June 03, 2024 with severe dizziness  Non-Vestibular symptoms: changes in hearing, diplopia, neck pain, and tinnitus  Type of dizziness: Diplopia and Spinning/Vertigo  Frequency: daily  Duration: constant  Aggravating factors: Induced by position change: rolling to the right and Induced by motion: turning head quickly  Relieving factors: head stationary and rest  Progression of symptoms: unchanged  OCULOMOTOR EXAM:  Ocular Alignment: normal  Ocular ROM: No Limitations  Spontaneous Nystagmus: absent  Gaze-Induced Nystagmus: absent  Smooth Pursuits: intact with horizontal movements, but saccades noted with vertical movements  Saccades: dysmetria     VESTIBULAR - OCULAR REFLEX:     Head-Impulse Test: HIT Right: positive HIT Left: positive     POSITIONAL TESTING: Right Dix-Hallpike: upbeating, right nystagmus Left Dix-Hallpike: slow moving nystagmus noted    OTHOSTATICS: not  done                                                                                                                             TREATMENT DATE:  09/23/2024: Nustep level 5 x6 min with PT present to discuss status Seated with 3# ankle weight:  LAQ, marching  2x10 each bilat (cueing not control foot on marching) Heel/toe raises with 3# ankle weights at barre x15 Sit to/from stand 2x5 with focusing on one point (loss of stability on first one, got better as she got her balance) Standing bicep curls with 4# weight 2x10 Standing alternating punches with 2# weight 2x10 Standing on foam pad performing head turns right and left with UE support of barre.  2x10 Single leg step up to foam pad with balance 2x10 Dix Hallpike positive on the right.  Proceeded with canalith repositioning with the Epley for the right side.    09/16/2024: Nustep level 4 x6 min with PT present to discuss status Seated with 3# ankle weight:  LAQ,  marching  2x10 each bilat (cueing not control foot on marching) Heel/toe raises at barre 2x10 Standing toe taps onto 6 step with unilateral UE support of barre.  2X10 bilat  Standing on foam pad performing head turns right and left with UE support of barre.  2x10 Standing on foam pad performing head up and down with UE support of barre.  2x10 Side stepping at barre down and back with blue band loop x3 laps Sit to/from stand 2x5 with focusing on one point with blue band loop around knees Standing rows with red tband 2x12 Standing shoulder ER with red tband x12 Leg Press (seat at 6) 60# x20   09/09/2024: Nustep level 5 x6 min with PT present to discuss status Seated with 3# ankle weight:  LAQ, marching, and heel/toe raises.  2x10 each bilat Standing heel raises at barre.  X10 Standing toe taps onto 6 step with unilateral UE support of barre.  2X10 bilat Standing on foam pad performing head turns with UE support of barre.  2x10 Standing on foam pad performing hip abduction 2x10 bilat Side stepping at barre down and back x2 laps Sit to/from stand x5 with focusing on one point (pt reported less dizziness) Standing shoulder ER with red tband 2x12 Practice ambulation with sharp turns to simulate where patient loses balance at home, but she admits that it is not the same as at home, as she is walking at a slower pace. Standing rows with red tband 2x12    PATIENT EDUCATION: Education details: Issued HEP Person educated: Patient Education method: Explanation, Demonstration, and Handouts Education comprehension: verbalized understanding and returned demonstration  HOME EXERCISE PROGRAM: Access Code: [facility] URL: [email or web address] Date: 06/24/2024 Prepared by: Jarrell Laming  Exercises - Seated Cervical Rotation AROM  - 1 x daily - 7 x weekly - 2 sets - 10 reps - Seated Scapular Retraction  - 1 x daily - 7 x  weekly - 2 sets - 10 reps - Sit to Stand  - 1 x daily - 7 x  weekly - 2 sets - 10 reps - Standing Marching  - 1 x daily - 7 x weekly - 2 sets - 10 reps - Seated Horizontal Smooth Pursuit  - 1 x daily - 7 x weekly - 2 sets - 10 reps - Seated Vertical Smooth Pursuit  - 1 x daily - 7 x weekly - 2 sets - 10 reps - Seated Proximal-Distal Smooth Pursuit  - 1 x daily - 7 x weekly - 2 sets - 10 reps - Brandt-Daroff Vestibular Exercise  - 1 x daily - 7 x weekly - 3-5 reps  GOALS: Goals reviewed with patient? Yes  SHORT TERM GOALS: Target date: 07/10/2024  Patient will be compliant with initial HEP. Baseline: Goal status: MET  2.  Patient will participate in a 6 min walk test to establish a baseline measurement. Baseline:  Goal status: MET on 06/24/24  3.  Patient will report a 30% improvement in dizziness. Baseline:  Goal status: MET   LONG TERM GOALS: Target date: 10/03/2024  Patient will be independent with advanced HEP to allow for self progression after discharge. Baseline:  Goal status: IN PROGRESS  2.  Patient will improve Dizziness Handicapped Inventory to no greater than 15% to demonstrate improvement in functional tasks. Baseline: 42/100 Goal status: IN PROGRESS  3.  Patient will report ability to walk with her community friends for >15 minutes with keeping up pace and 50% less fatigue. Baseline: reports slower gait and increased dyspnea Goal status: IN PROGRESS  4.  Patient will increase her UE strength to [facility] to allow her to pick up her cat and place it in the carrier. Baseline:  Goal status: IN PROGRESS  5.  Patient will increase LE strength to [facility] to allow her to return to gym at [facility] and perform weight machines. Baseline:  Goal status: IN PROGRESS  6.  Patient will increase cervical ROM to [facility] to allow her to look both directions to more easily cross the road and less pain with driving. Baseline:  Goal status: IN PROGRESS (see above)  ASSESSMENT:  CLINICAL IMPRESSION:  Ms cj beecher to PT today stating she  is feeling the usual headache pain of 2/10, and that her right ear is giving her trouble and causing some dizziness. She progressed through strengthening exercises increases rep which she tolerated well. Standing alternating punches was added to work on stability with a dynamic movement, she responded well noting her balance was challenged. She appeared to have more incidences of instability throughout the session. More breaks were taken to control her dizziness. Dix Hallpike positive on the right.  Proceeded with canalith repositioning with the Epley for the right side. Patient continues to require skilled PT to progress towards improved balance and decreased dizziness as well as improved functional strengthening.    OBJECTIVE IMPAIRMENTS: Abnormal gait, decreased balance, difficulty walking, decreased ROM, decreased strength, dizziness, impaired perceived functional ability, increased muscle spasms, impaired flexibility, postural dysfunction, and pain.   ACTIVITY LIMITATIONS: bending, standing, squatting, bed mobility, and locomotion level  PARTICIPATION LIMITATIONS: cleaning, laundry, driving, and community activity  PERSONAL FACTORS: Past/current experiences, Time since onset of injury/illness/exacerbation, and 3+ comorbidities: Osteopenia, Sensorinerual hearing loss, macular degeneration are also affecting patient's functional outcome.   REHAB POTENTIAL: Good  CLINICAL DECISION MAKING: Evolving/moderate complexity  EVALUATION COMPLEXITY: Moderate   PLAN:  PT FREQUENCY: 1-2x/week  PT DURATION: 8  weeks  PLANNED INTERVENTIONS: 97164- PT Re-evaluation, 97750- Physical Performance Testing, 97110-Therapeutic exercises, 97530- Therapeutic activity, [id number]- Neuromuscular re-education, [phone]- Self Care, 02859- Manual therapy, [phone]- Gait training, [phone]- Canalith repositioning, [phone]- Aquatic Therapy, [phone]- Electrical stimulation (unattended), [phone]- Electrical stimulation (manual), [id number]-  Vasopneumatic device, [id number]- Ultrasound, [id number]- Traction (mechanical), [id number]- Ionotophoresis 4mg /ml Dexamethasone, 20560 (1-2 muscles), 20561 (3+ muscles)- Dry Needling, Patient/Family education, Balance training, Stair training, Taping, Joint mobilization, Joint manipulation, Spinal manipulation, Spinal mobilization, Vestibular training, Cryotherapy, and Moist heat  PLAN FOR NEXT SESSION: Assess and progress HEP as indicated, strengthening, flexibility, vestibular rehab   Arleene Harsh, SPT 09/23/24, 12:56 PM    I agree with the following treatment note after reviewing documentation. This session was performed under the supervision of a licensed clinician.  Jarrell Laming, PT, DPT 09/23/24, 12:56 PM  [facility] [address], [address] [place], [place] [address] Phone # [phone] Fax [phone]   "

## 2024-09-25 ENCOUNTER — Encounter: Payer: Self-pay | Admitting: Audiology

## 2024-09-30 ENCOUNTER — Ambulatory Visit: Admitting: Rehabilitative and Restorative Service Providers"

## 2024-09-30 ENCOUNTER — Encounter: Payer: Self-pay | Admitting: Rehabilitative and Restorative Service Providers"

## 2024-09-30 DIAGNOSIS — R42 Dizziness and giddiness: Secondary | ICD-10-CM

## 2024-09-30 DIAGNOSIS — M6281 Muscle weakness (generalized): Secondary | ICD-10-CM

## 2024-09-30 DIAGNOSIS — H8113 Benign paroxysmal vertigo, bilateral: Secondary | ICD-10-CM

## 2024-09-30 DIAGNOSIS — R2689 Other abnormalities of gait and mobility: Secondary | ICD-10-CM

## 2024-09-30 DIAGNOSIS — R262 Difficulty in walking, not elsewhere classified: Secondary | ICD-10-CM

## 2024-09-30 NOTE — Therapy (Addendum)
 " OUTPATIENT PHYSICAL THERAPY VESTIBULAR TREATMENT NOTE  AND DISCHARGE SUMMARY     Patient Name: Mary Malone MRN: [id number] DOB:Feb 17, 1946, 79 y.o., female Today's Date: 09/30/2024  END OF SESSION:  PT End of Session - 09/30/24 1330     Visit Number 13    Date for Recertification  10/02/24    Authorization Type [employer] Medicare ([employer] approved 09/07/2024 - 10/05/2024    Authorization - Visit Number 4    Authorization - Number of Visits 4    Progress Note Due on Visit 15    PT Start Time 1230    PT Stop Time 1315    PT Time Calculation (min) 45 min    Activity Tolerance Patient tolerated treatment well    Behavior During Therapy [facility] for tasks assessed/performed             Past Medical History:  Diagnosis Date   ALLERGIC RHINITIS    Allergy  05/04/1958   Plants, animals   Ataxia    Back pain    Cancer (HCC) 12/02/2020   basal cell of nose   Cataract 2014   Surgery both eyes   Dense breast    DEPRESSION    Fibroid    GERD    Hearing loss    History of radiation therapy 12/02/2020   for basal cell carcinoma of nose   HYPERLIPIDEMIA    diet control efforts   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   Osteoporosis 2018   PMB (postmenopausal bleeding) 12/2001   polyps   Serum calcium elevated 2021   sees Dr.Ellison/pulmonology with [place]   Thyroid  disease    hypothyroidism   URINARY INCONTINENCE    OAB   Wisdom teeth removed 1866/1967   Past Surgical History:  Procedure Laterality Date   BREAST CYST ASPIRATION  12/05/2001   CATARACT EXTRACTION Bilateral 07/2012, 09/2012   DILATION AND CURETTAGE OF UTERUS  2003   EYE SURGERY  2014   Cataracts   HYSTEROSCOPY  01/2002   resection, sm fibroid   Left knee surgey     MENISCUS REPAIR     left knee 2010   REFRACTIVE SURGERY  02/2016   TYMPANOSTOMY TUBE PLACEMENT Right 06/18/2024   WISDOM TOOTH EXTRACTION     Patient Active Problem List   Diagnosis Date Noted   Dizziness 06/15/2024   Right chronic serous  otitis media 06/15/2024   Impacted cerumen of right ear 06/15/2024   Double vision 12/13/2023   Sensorineural hearing loss, 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   Chronic rhinitis 06/10/2023   Other specified disorders of eustachian tube, right ear 06/10/2023   Thyroid  nodule 09/14/2022   Dyspnea on exertion 04/27/2022   Cervical radiculopathy 02/03/2021   Basal cell carcinoma (BCC) of lateral side wall of nose 11/09/2020   Hypercalcemia 04/20/2020   Numbness 04/19/2020   Left leg pain 07/08/2018   Sacral back pain 04/16/2018   Osteoporosis 08/09/2016   White matter abnormality on MRI of brain 07/11/2015   MCI (mild cognitive impairment) 07/11/2015   Routine general medical examination at a health care facility 02/11/2015   Anxiety and depression 11/01/2010   OAB (overactive bladder)    GERD 09/23/2007    PCP: Rollene Almarie LABOR, MD REFERRING PROVIDER: Karis Clunes, MD  REFERRING DIAG: R42 (ICD-10-CM) - Dizziness  THERAPY DIAG:  Dizziness and giddiness  Other abnormalities of gait and mobility  Muscle weakness (generalized)  BPPV (benign paroxysmal positional vertigo), bilateral  Difficulty in walking, not elsewhere classified  ONSET DATE: June 03, 2024  Rationale for Evaluation and Treatment: Rehabilitation  SUBJECTIVE:   SUBJECTIVE STATEMENT: Patient reports she is feeling the usual pain today at 2/10 headache. She reports her dizziness is the same, however she feels a level of fatigue today. She reports no change in dizziness after the epley last session.  Pt accompanied by: self  PERTINENT HISTORY:  Ataxia, Cerebellar atrophy, macular degeneration, diplopia, right ear eustachian tube dysfunction (scheduled for tympanostomy tube placement), osteopenia  PAIN:  Are you having pain? Yes: NPRS scale: 2/10 Pain location: headache Pain description: ache Aggravating factors: rotation Relieving factors: rest  PRECAUTIONS: Fall  RED FLAGS: None   WEIGHT BEARING  RESTRICTIONS: No  FALLS: Has patient fallen in last 6 months? No  LIVING ENVIRONMENT: Lives with: lives alone Lives in: House/apartment Stairs: single level condo in [place] Has following equipment at home: Single point cane and Grab bars  PLOF: Independent and Leisure: walking with friends in community  PATIENT GOALS: To be able to return to walking with her friends in the community and decrease dizziness.  OBJECTIVE:  Note: Objective measures were completed at Evaluation unless otherwise noted.  DIAGNOSTIC FINDINGS:  Brain MRI on 03/15/2023: IMPRESSION: Unremarkable MRI of the head and orbits for patient age.  COGNITION: Overall cognitive status: Within functional limits for tasks assessed    POSTURE:  rounded shoulders and forward head  Cervical ROM:    Active A/ROM (deg) eval A/ROM (Deg) 07/22/24  Flexion 40 53  Extension 35 37  Right lateral flexion 25 25  Left lateral flexion 25 25  Right rotation 38 38  Left rotation 50 60  (Blank rows = not tested)  STRENGTH:  Eval: Bilateral hip strength grossly 3+/5 Bilateral quad strength of 5-/5 Bilateral shoulder strength 4-/5  07/22/2024: Bilateral hip strength of 3+ to 4-/5 grossly throughout Right quad strength is 5/5, left quad is 5-/5 Bilateral shoulder strength 4-/5    GAIT: Gait pattern: ataxic Distance walked: >500 ft Assistive device utilized: Single point cane Level of assistance: Modified independence Comments: slower gait velocity  FUNCTIONAL TESTS:  Eval: 5 times sit to stand: 19.44 sec with UE use Timed up and go (TUG): 19.03 sec with [facility]  06/24/2024: 6 minute walk test:  900 ft (age related norms are 1,393 ft)  07/22/2024: 5 times sit to stand: 12.24 sec with UE use 6 minute walk test:  966 ft  09/02/2024: 5 times sit to stand: 11.82 sec with UE use 6 minute walk test:  1,043 ft    PATIENT SURVEYS:  Eval: Dizziness Handicapped Inventory:  Total Score: 42 /  100  08/05/2024:  DHI Total Score: 36 / 100  09/02/2024:  Dizziness Handicapped Inventory:  Total Score: 22/100  09/30/24: Dizziness Handicapped Inventory:  Total Score: 34/100  VESTIBULAR ASSESSMENT:    SYMPTOM BEHAVIOR:  Subjective history: Started on June 03, 2024 with severe dizziness  Non-Vestibular symptoms: changes in hearing, diplopia, neck pain, and tinnitus  Type of dizziness: Diplopia and Spinning/Vertigo  Frequency: daily  Duration: constant  Aggravating factors: Induced by position change: rolling to the right and Induced by motion: turning head quickly  Relieving factors: head stationary and rest  Progression of symptoms: unchanged  OCULOMOTOR EXAM:  Ocular Alignment: normal  Ocular ROM: No Limitations  Spontaneous Nystagmus: absent  Gaze-Induced Nystagmus: absent  Smooth Pursuits: intact with horizontal movements, but saccades noted with vertical movements  Saccades: dysmetria     VESTIBULAR - OCULAR REFLEX:     Head-Impulse Test: HIT Right:  positive HIT Left: positive     POSITIONAL TESTING: Right Dix-Hallpike: upbeating, right nystagmus Left Dix-Hallpike: slow moving nystagmus noted    OTHOSTATICS: not done                                                                                                                             TREATMENT DATE:  09/30/2024: Nustep level 5 x6 min with PT present to discuss status Dizziness Handicapped Inventory:  Total Score:  34/100 Seated with 3# ankle weight:  LAQ, marching  2x10 each bilat (cueing not control foot on marching) Standing alternating punches with 2# weight 2x10 Single leg step up to foam pad with balance 2x10 Standing on foam pad hip abduction 2x10 bilat Heel/toe raises with wall for support x20  09/23/2024: Nustep level 5 x6 min with PT present to discuss status Seated with 3# ankle weight:  LAQ, marching  2x10 each bilat (cueing not control foot on marching) Heel/toe raises with 3# ankle  weights at barre x15 Sit to/from stand 2x5 with focusing on one point (loss of stability on first one, got better as she got her balance) Standing bicep curls with 4# weight 2x10 Standing alternating punches with 2# weight 2x10 Standing on foam pad performing head turns right and left with UE support of barre.  2x10 Single leg step up to foam pad with balance 2x10 Dix Hallpike positive on the right.  Proceeded with canalith repositioning with the Epley for the right side.    09/16/2024: Nustep level 4 x6 min with PT present to discuss status Seated with 3# ankle weight:  LAQ, marching  2x10 each bilat (cueing not control foot on marching) Heel/toe raises at barre 2x10 Standing toe taps onto 6 step with unilateral UE support of barre.  2X10 bilat  Standing on foam pad performing head turns right and left with UE support of barre.  2x10 Standing on foam pad performing head up and down with UE support of barre.  2x10 Side stepping at barre down and back with blue band loop x3 laps Sit to/from stand 2x5 with focusing on one point with blue band loop around knees Standing rows with red tband 2x12 Standing shoulder ER with red tband x12 Leg Press (seat at 6) 60# x20   PATIENT EDUCATION: Education details: Issued HEP Person educated: Patient Education method: Explanation, Demonstration, and Handouts Education comprehension: verbalized understanding and returned demonstration  HOME EXERCISE PROGRAM: Access Code: [facility] URL: [email or web address] Date: 09/30/2024 Prepared by: Jarrell Menke  Exercises - Seated Cervical Rotation AROM  - 1 x daily - 7 x weekly - 2 sets - 10 reps - Seated Scapular Retraction  - 1 x daily - 7 x weekly - 2 sets - 10 reps - Sit to Stand  - 1 x daily - 7 x weekly - 2 sets - 10 reps - Standing Marching  - 1 x daily - 7 x weekly - 2 sets - 10 reps - Standing  Hip Abduction with Counter Support  - 1 x daily - 7 x weekly - 2 sets - 10 reps - Heel  Raises with Counter Support  - 1 x daily - 7 x weekly - 2 sets - 10 reps - Standing Shoulder Row with Anchored Resistance  - 1 x daily - 7 x weekly - 2 sets - 10 reps - Shoulder extension with resistance - Neutral  - 1 x daily - 7 x weekly - 2 sets - 10 reps - Step Up  - 1 x daily - 7 x weekly - 2 sets - 10 reps - Seated Horizontal Smooth Pursuit  - 1 x daily - 7 x weekly - 2 sets - 10 reps - Seated Vertical Smooth Pursuit  - 1 x daily - 7 x weekly - 2 sets - 10 reps - Seated Proximal-Distal Smooth Pursuit  - 1 x daily - 7 x weekly - 2 sets - 10 reps - Corner Balance Feet Together: Eyes Open With Head Turns  - 1 x daily - 7 x weekly - 2 sets - 15 reps  GOALS: Goals reviewed with patient? Yes  SHORT TERM GOALS: Target date: 07/10/2024  Patient will be compliant with initial HEP. Baseline: Goal status: MET  2.  Patient will participate in a 6 min walk test to establish a baseline measurement. Baseline:  Goal status: MET on 06/24/24  3.  Patient will report a 30% improvement in dizziness. Baseline:  Goal status: MET   LONG TERM GOALS: Target date: 10/03/2024  Patient will be independent with advanced HEP to allow for self progression after discharge. Baseline:  Goal status: Met on 09/30/24  2.  Patient will improve Dizziness Handicapped Inventory to no greater than 15% to demonstrate improvement in functional tasks. Baseline: 42/100 Goal status: Partially met 34/100 on 09/30/24, best score was 22% on 09/02/24  3.  Patient will report ability to walk with her community friends for >15 minutes with keeping up pace and 50% less fatigue. Baseline: reports slower gait and increased dyspnea Goal status: MET on 09/30/24  4.  Patient will increase her UE strength to [facility] to allow her to pick up her cat and place it in the carrier. Baseline:  Goal status: MET 09/30/24  5.  Patient will increase LE strength to [facility] to allow her to return to gym at [facility] and perform weight  machines. Baseline:  Goal status: NOT MET for personal reasons, patient states that she has not returned to the gym, but feels she could on 09/30/24  6.  Patient will increase cervical ROM to [facility] to allow her to look both directions to more easily cross the road and less pain with driving. Baseline:  Goal status: Met on 09/30/24  ASSESSMENT:  CLINICAL IMPRESSION:  Ms Noblett presents to PT today stating she is feeling the usual headache pain. She has met all of her short term goals previously and has met most of her long term goals with being able to use cervical ROM, use UE strengthen, and improvements with dyspnea. She partially met her goals with DHI showing some improvements. She reported the epley did not improve her dizziness showing that the dizziness may be a result of increased stress and anxiety levels. She has been making progress towards her goals and has become stagnant with her progress with her dizziness.  Patient provided with and updated HEP for continued work at home.  Educated patient to continue to work on the [employer] and follow  up with her MD in a couple of months and if she feels that she is at a lower stress time where therapy could be more beneficial, to obtain a new order for PT.  Patient verbalizes her understanding.  Patient discharged at this time to continue with HEP and follow up with MD.   OBJECTIVE IMPAIRMENTS: Abnormal gait, decreased balance, difficulty walking, decreased ROM, decreased strength, dizziness, impaired perceived functional ability, increased muscle spasms, impaired flexibility, postural dysfunction, and pain.   ACTIVITY LIMITATIONS: bending, standing, squatting, bed mobility, and locomotion level  PARTICIPATION LIMITATIONS: cleaning, laundry, driving, and community activity  PERSONAL FACTORS: Past/current experiences, Time since onset of injury/illness/exacerbation, and 3+ comorbidities: Osteopenia, Sensorinerual hearing loss, macular degeneration are also  affecting patient's functional outcome.   REHAB POTENTIAL: Good  CLINICAL DECISION MAKING: Evolving/moderate complexity  EVALUATION COMPLEXITY: Moderate   PLAN:  PT FREQUENCY: 1-2x/week  PT DURATION: 8 weeks  PLANNED INTERVENTIONS: 97164- PT Re-evaluation, 97750- Physical Performance Testing, 97110-Therapeutic exercises, 97530- Therapeutic activity, [id number]- Neuromuscular re-education, 97535- Self Care, [id number]- Manual therapy, [id number]- Gait training, [phone]- Canalith repositioning, [id number]- Aquatic Therapy, H9716- Electrical stimulation (unattended), [id number]- Electrical stimulation (manual), [id number]- Vasopneumatic device, [id number]- Ultrasound, [id number]- Traction (mechanical), [id number]- Ionotophoresis 4mg /ml Dexamethasone, [id number] (1-2 muscles), [id number] (3+ muscles)- Dry Needling, Patient/Family education, Balance training, Stair training, Taping, Joint mobilization, Joint manipulation, Spinal manipulation, Spinal mobilization, Vestibular training, Cryotherapy, and Moist heat  PHYSICAL THERAPY DISCHARGE SUMMARY  Visits from Start of Care: 13  Current functional level related to goals / functional outcomes: See above   Remaining deficits: See above   Education / Equipment: Continue HEP   Patient agrees to discharge. Patient goals were partially met. Patient is being discharged due to maximized rehab potential.     Arleene Harsh, SPT 09/30/24, 1:40 PM    I agree with the following treatment note after reviewing documentation. This session was performed under the supervision of a licensed clinician.  Jarrell Laming, PT, DPT 09/30/24, 1:40 PM  [facility] [address] [place], [place] [address] Phone # [phone] Fax [phone]   "

## 2024-10-05 ENCOUNTER — Ambulatory Visit: Admitting: Obstetrics and Gynecology

## 2024-10-06 ENCOUNTER — Other Ambulatory Visit: Payer: Self-pay

## 2024-10-07 ENCOUNTER — Other Ambulatory Visit: Payer: Self-pay | Admitting: Obstetrics and Gynecology

## 2024-10-07 MED ORDER — OXYBUTYNIN CHLORIDE ER 5 MG PO TB24: ORAL_TABLET | ORAL | 0 refills | Status: DC

## 2024-10-08 ENCOUNTER — Other Ambulatory Visit: Payer: Self-pay | Admitting: Obstetrics and Gynecology

## 2024-10-08 ENCOUNTER — Encounter: Payer: Self-pay | Admitting: Obstetrics and Gynecology

## 2024-10-08 MED ORDER — OXYBUTYNIN CHLORIDE ER 5 MG PO TB24: ORAL_TABLET | ORAL | 1 refills | Status: AC

## 2024-10-08 NOTE — Telephone Encounter (Signed)
Routing to Dr. Silva.  

## 2024-11-03 ENCOUNTER — Ambulatory Visit: Admitting: Obstetrics and Gynecology

## 2024-12-14 ENCOUNTER — Encounter: Admitting: Internal Medicine

## 2025-01-18 ENCOUNTER — Ambulatory Visit (INDEPENDENT_AMBULATORY_CARE_PROVIDER_SITE_OTHER): Admitting: Otolaryngology

## 2025-02-17 ENCOUNTER — Ambulatory Visit

## 2025-08-18 ENCOUNTER — Encounter: Admitting: Obstetrics and Gynecology
